# Patient Record
Sex: Female | Born: 1975
Health system: Southern US, Community
[De-identification: ages and names within clinical notes are randomized; demographics above are authoritative.]

## PROBLEM LIST (undated history)

## (undated) DIAGNOSIS — B029 Zoster without complications: Secondary | ICD-10-CM

## (undated) DIAGNOSIS — F419 Anxiety disorder, unspecified: Secondary | ICD-10-CM

## (undated) DIAGNOSIS — G8929 Other chronic pain: Secondary | ICD-10-CM

## (undated) DIAGNOSIS — N393 Stress incontinence (female) (male): Secondary | ICD-10-CM

## (undated) DIAGNOSIS — M069 Rheumatoid arthritis, unspecified: Secondary | ICD-10-CM

## (undated) DIAGNOSIS — R1031 Right lower quadrant pain: Secondary | ICD-10-CM

## (undated) DIAGNOSIS — M5030 Other cervical disc degeneration, unspecified cervical region: Secondary | ICD-10-CM

## (undated) DIAGNOSIS — R0600 Dyspnea, unspecified: Secondary | ICD-10-CM

## (undated) DIAGNOSIS — M1990 Unspecified osteoarthritis, unspecified site: Secondary | ICD-10-CM

## (undated) DIAGNOSIS — Z8639 Personal history of other endocrine, nutritional and metabolic disease: Secondary | ICD-10-CM

## (undated) DIAGNOSIS — E039 Hypothyroidism, unspecified: Secondary | ICD-10-CM

## (undated) DIAGNOSIS — R112 Nausea with vomiting, unspecified: Secondary | ICD-10-CM

## (undated) DIAGNOSIS — I10 Essential (primary) hypertension: Secondary | ICD-10-CM

## (undated) DIAGNOSIS — F32A Depression, unspecified: Secondary | ICD-10-CM

## (undated) DIAGNOSIS — Z87442 Personal history of urinary calculi: Secondary | ICD-10-CM

## (undated) DIAGNOSIS — Z9889 Other specified postprocedural states: Secondary | ICD-10-CM

## (undated) DIAGNOSIS — K219 Gastro-esophageal reflux disease without esophagitis: Secondary | ICD-10-CM

## (undated) HISTORY — DX: Rheumatoid arthritis, unspecified: M06.9

## (undated) HISTORY — PX: WISDOM TOOTH EXTRACTION: SHX21

## (undated) HISTORY — PX: OTHER SURGICAL HISTORY: SHX169

## (undated) HISTORY — PX: DILATION AND CURETTAGE OF UTERUS: SHX78

---

## 1997-02-20 HISTORY — PX: KNEE ARTHROSCOPY: SUR90

## 1999-02-21 DIAGNOSIS — M069 Rheumatoid arthritis, unspecified: Secondary | ICD-10-CM | POA: Insufficient documentation

## 2003-01-20 ENCOUNTER — Other Ambulatory Visit: Admission: RE | Admit: 2003-01-20 | Discharge: 2003-01-20 | Payer: Self-pay | Admitting: Obstetrics and Gynecology

## 2003-10-26 ENCOUNTER — Emergency Department (HOSPITAL_COMMUNITY): Admission: EM | Admit: 2003-10-26 | Discharge: 2003-10-26 | Payer: Self-pay | Admitting: Emergency Medicine

## 2003-10-28 ENCOUNTER — Ambulatory Visit (HOSPITAL_COMMUNITY): Admission: RE | Admit: 2003-10-28 | Discharge: 2003-10-28 | Payer: Self-pay | Admitting: Orthopedic Surgery

## 2004-01-13 ENCOUNTER — Ambulatory Visit (HOSPITAL_COMMUNITY): Admission: RE | Admit: 2004-01-13 | Discharge: 2004-01-13 | Payer: Self-pay | Admitting: Orthopedic Surgery

## 2004-01-25 ENCOUNTER — Other Ambulatory Visit: Admission: RE | Admit: 2004-01-25 | Discharge: 2004-01-25 | Payer: Self-pay | Admitting: Obstetrics and Gynecology

## 2005-03-02 ENCOUNTER — Other Ambulatory Visit: Admission: RE | Admit: 2005-03-02 | Discharge: 2005-03-02 | Payer: Self-pay | Admitting: Obstetrics and Gynecology

## 2005-09-29 ENCOUNTER — Ambulatory Visit (HOSPITAL_COMMUNITY): Admission: RE | Admit: 2005-09-29 | Discharge: 2005-09-29 | Payer: Self-pay | Admitting: Internal Medicine

## 2005-12-06 ENCOUNTER — Ambulatory Visit (HOSPITAL_COMMUNITY): Admission: RE | Admit: 2005-12-06 | Discharge: 2005-12-06 | Payer: Self-pay | Admitting: Internal Medicine

## 2007-01-28 ENCOUNTER — Ambulatory Visit (HOSPITAL_COMMUNITY): Admission: RE | Admit: 2007-01-28 | Discharge: 2007-01-28 | Payer: Self-pay | Admitting: Neurology

## 2007-03-01 ENCOUNTER — Inpatient Hospital Stay (HOSPITAL_COMMUNITY): Admission: AD | Admit: 2007-03-01 | Discharge: 2007-03-05 | Payer: Self-pay | Admitting: Obstetrics and Gynecology

## 2007-04-22 ENCOUNTER — Ambulatory Visit (HOSPITAL_COMMUNITY): Admission: RE | Admit: 2007-04-22 | Discharge: 2007-04-22 | Payer: Self-pay | Admitting: Neurology

## 2007-04-25 ENCOUNTER — Ambulatory Visit (HOSPITAL_COMMUNITY): Admission: RE | Admit: 2007-04-25 | Discharge: 2007-04-25 | Payer: Self-pay | Admitting: Neurology

## 2007-04-25 ENCOUNTER — Ambulatory Visit: Payer: Self-pay | Admitting: Cardiology

## 2010-06-21 ENCOUNTER — Ambulatory Visit (HOSPITAL_COMMUNITY)
Admission: RE | Admit: 2010-06-21 | Discharge: 2010-06-21 | Disposition: A | Discharge status: Home / Self Care | Payer: BC Managed Care – PPO | Source: Ambulatory Visit | Attending: Internal Medicine | Admitting: Internal Medicine

## 2010-06-21 ENCOUNTER — Other Ambulatory Visit (HOSPITAL_COMMUNITY): Payer: Self-pay | Admitting: Internal Medicine

## 2010-06-21 DIAGNOSIS — R05 Cough: Secondary | ICD-10-CM

## 2010-06-21 DIAGNOSIS — R062 Wheezing: Secondary | ICD-10-CM | POA: Insufficient documentation

## 2010-06-21 DIAGNOSIS — R059 Cough, unspecified: Secondary | ICD-10-CM | POA: Insufficient documentation

## 2010-07-05 NOTE — Op Note (Signed)
Jessica Pacheco, Jessica Pacheco               ACCOUNT NO.:  [id number]   MEDICAL RECORD NO.:  [id number]          PATIENT TYPE:  INP   LOCATION:  9160                          FACILITY:  [facility]   PHYSICIAN:  Duke Salvia. Marcelle Overlie, M.D.DATE OF BIRTH:  1975-07-29   DATE OF PROCEDURE:  03/02/2007  DATE OF DISCHARGE:                               OPERATIVE REPORT   After a second stage of 2-1/2 to 3 hours was noticed to be +3 station,  straight OA, vertex visible with pushing effort, Foley catheter was in  place.  Discussion regarding VE assist which she preferred due to  maternal exhaustion.  The Kiwi was applied, coordinated with maternal  pushing efforts, x2 traction efforts to effect delivery of a female.  The infant was suctioned and the remainder of the torso was delivered  easily.  Apgars 9 and 9.  Cord pH was sent.  The infant was suctioned,  cord clamped and placed in the warmer.  Placenta delivered spontaneously  intact.  Atony was noted initially with some brisk bleeding, responded  to massage, IV Pitocin due to continued bleeding.  She did receive IM  Methergine x1 and Hemabate 1 amp followed in 20-30 minutes by another  amp with again estimated EBL of 1000 mL, continued massage that was  going on during this time of the repair.  Gloved hand was used to  explore the rectum.  The rectum was intact.  There was a partial third  degree extension.  The sphincter was repaired with interrupted 2-0  Vicryl figure-of-eight sutures and a standard layer repair with zero  Vicryl Rapide.  A right periurethral laceration was then closed with  interrupted 3-0 Vicryl Rapide sutures with some nursing assistance with  a retractor for better visualization.  She did have a type and screen  performed during that time.  Foley catheter was repositioned to follow  her output and hemoglobin will be checked within the hour.  BP was last  127/77 with pulse of 123.      Richard M. Marcelle Overlie, M.D.  Electronically  Signed     [facility]MEDQ  D:  03/02/2007  T:  03/02/2007  Job:  [id number]

## 2010-11-10 LAB — URIC ACID: Uric Acid, Serum: 6.7

## 2010-11-10 LAB — CBC
HCT: 14.4 — ABNORMAL LOW
HCT: 16.3 — ABNORMAL LOW
HCT: 21 — ABNORMAL LOW
HCT: 33.1 — ABNORMAL LOW
Hemoglobin: 11.4 — ABNORMAL LOW
Hemoglobin: 5 — CL
Hemoglobin: 5.5 — CL
Hemoglobin: 7.3 — CL
MCHC: 33.6
MCHC: 34.4
MCHC: 34.5
MCHC: 34.5
MCV: 92.9
MCV: 93.7
MCV: 94.4
MCV: 94.6
Platelets: 141 — ABNORMAL LOW
Platelets: 142 — ABNORMAL LOW
Platelets: 149 — ABNORMAL LOW
Platelets: 164
RBC: 1.52 — ABNORMAL LOW
RBC: 1.73 — ABNORMAL LOW
RBC: 2.24 — ABNORMAL LOW
RBC: 3.56 — ABNORMAL LOW
RDW: 13.9
RDW: 14.8
RDW: 14.8
RDW: 15
WBC: 10.2
WBC: 13 — ABNORMAL HIGH
WBC: 18 — ABNORMAL HIGH
WBC: 7.7

## 2010-11-10 LAB — URINALYSIS, DIPSTICK ONLY
Bilirubin Urine: NEGATIVE
Glucose, UA: NEGATIVE
Ketones, ur: NEGATIVE
Leukocytes, UA: NEGATIVE
Nitrite: NEGATIVE
Protein, ur: NEGATIVE
Specific Gravity, Urine: 1.01
Urobilinogen, UA: 0.2
pH: 7

## 2010-11-10 LAB — HEMOGLOBIN AND HEMATOCRIT, BLOOD
HCT: 24.6 — ABNORMAL LOW
HCT: 27 — ABNORMAL LOW
Hemoglobin: 8.6 — ABNORMAL LOW
Hemoglobin: 9.2 — ABNORMAL LOW

## 2010-11-10 LAB — COMPREHENSIVE METABOLIC PANEL
ALT: 14
AST: 30
Albumin: 2.7 — ABNORMAL LOW
Alkaline Phosphatase: 219 — ABNORMAL HIGH
BUN: 7
CO2: 24
Calcium: 9.6
Chloride: 106
Creatinine, Ser: 1.07
GFR calc Af Amer: >60
GFR calc non Af Amer: 60 — ABNORMAL LOW
Glucose, Bld: 93
Potassium: 3.9
Sodium: 139
Total Bilirubin: 1.1
Total Protein: 6

## 2010-11-10 LAB — RPR: RPR Ser Ql: NONREACTIVE

## 2010-11-10 LAB — TYPE AND SCREEN
ABO/RH(D): A POS
Antibody Screen: NEGATIVE

## 2010-11-10 LAB — ABO/RH: ABO/RH(D): A POS

## 2010-11-10 LAB — PROTEIN, URINE, RANDOM: Total Protein, Urine: 25

## 2010-11-10 LAB — LACTATE DEHYDROGENASE: LDH: 130

## 2011-01-02 ENCOUNTER — Ambulatory Visit (INDEPENDENT_AMBULATORY_CARE_PROVIDER_SITE_OTHER): Payer: Self-pay | Admitting: Pharmacist

## 2011-01-02 DIAGNOSIS — Z309 Encounter for contraceptive management, unspecified: Secondary | ICD-10-CM

## 2011-01-02 DIAGNOSIS — IMO0001 Reserved for inherently not codable concepts without codable children: Secondary | ICD-10-CM | POA: Insufficient documentation

## 2011-01-02 DIAGNOSIS — M069 Rheumatoid arthritis, unspecified: Secondary | ICD-10-CM

## 2011-01-02 NOTE — Progress Notes (Signed)
  Subjective:    Patient ID: Jessica Pacheco, female    DOB: Jan 24, 1976, 35 y.o.   MRN: [id number]  HPI  Reviewed and agree with Dr. Macky Lower management.   Review of Systems     Objective:   Physical Exam        Assessment & Plan:

## 2011-01-02 NOTE — Assessment & Plan Note (Signed)
Following medication review, no suggestions for change.  Complete medication list provided to patient.  Total time in face to face medication review: 10 minutes.

## 2011-01-02 NOTE — Progress Notes (Signed)
  Subjective:    Patient ID: Jessica Pacheco, female    DOB: 07-18-1975, 35 y.o.   MRN: [id number]  HPI Patient arrives in good spirits for medication review accompanied by her daughter. She reports seeing Carylon Perches as her primary care provider, Mariah Milling as her rheumatologist, and Richarda Overlie as her Ob-Gyn. She has been diagnosed with Rheumatoid Arthritis for 11 years and states she is currently under acceptable level of control.      Review of Systems     Objective:   Physical Exam        Assessment & Plan:  Following medication review, no suggestions for change.  Complete medication list provided to patient.  Total time in face to face medication review: 10 minutes.

## 2011-07-26 ENCOUNTER — Encounter (INDEPENDENT_AMBULATORY_CARE_PROVIDER_SITE_OTHER): Payer: Self-pay | Admitting: *Deleted

## 2011-07-27 ENCOUNTER — Ambulatory Visit (INDEPENDENT_AMBULATORY_CARE_PROVIDER_SITE_OTHER): Payer: 59 | Admitting: Internal Medicine

## 2011-07-27 ENCOUNTER — Encounter (INDEPENDENT_AMBULATORY_CARE_PROVIDER_SITE_OTHER): Payer: Self-pay | Admitting: Internal Medicine

## 2011-07-27 VITALS — BP 132/90 | HR 80 | Temp 98.2°F | Ht 66.0 in | Wt 193.7 lb

## 2011-07-27 DIAGNOSIS — R195 Other fecal abnormalities: Secondary | ICD-10-CM

## 2011-07-27 DIAGNOSIS — R194 Change in bowel habit: Secondary | ICD-10-CM

## 2011-07-27 DIAGNOSIS — R198 Other specified symptoms and signs involving the digestive system and abdomen: Secondary | ICD-10-CM

## 2011-07-27 LAB — CBC WITH DIFFERENTIAL/PLATELET
Basophils Relative: 0 % (ref 0–1)
Hemoglobin: 12 g/dL (ref 12.0–15.0)
Lymphocytes Relative: 41 % (ref 12–46)
MCHC: 31.5 g/dL (ref 30.0–36.0)
Monocytes Relative: 11 % (ref 3–12)
Neutro Abs: 2.7 10*3/uL (ref 1.7–7.7)
Neutrophils Relative %: 45 % (ref 43–77)
RBC: 4.1 MIL/uL (ref 3.87–5.11)
WBC: 5.9 10*3/uL (ref 4.0–10.5)

## 2011-07-27 MED ORDER — HYOSCYAMINE SULFATE 0.125 MG SL SUBL: 0.1250 mg | SUBLINGUAL_TABLET | SUBLINGUAL | Status: DC | PRN

## 2011-07-27 NOTE — Patient Instructions (Signed)
Stool studies and a CBC today. If normal, Colonoscopy. I discussed this case with Dr Karilyn Cota.

## 2011-07-27 NOTE — Progress Notes (Signed)
Subjective:     Patient ID: Jessica Pacheco, female   DOB: 03/09/1975, 36 y.o.   MRN: [id number]  HPI Referred by Dr. Ouida Sills  For change in her stools.  She has abdominal pain and sometimes the pain will localize on the rt.  Stools are watery, soft, to loose.  Some are formed.  Sometimes they are pure water.  This has been occuring for 2 months.  Here stomach will become upset and she will have diarrhea.  When she has the pain, she has to bend over.   Crampy type pain. Stools have had a foul smell at times. No melena or bright red rectal bleeding. She has tried Imodium in the past. Appetite is good. No unintentional weight loss.  No abdominal now. She is having 4-10 stools a day. She normal has 3 stools a day. There is a family hx of colon cancer. Aunt had colon cancer age 36. Grandmother had ulcer and diverticulitis. Recently diagnosed with pancreatic and adrenal gland cancer.      Augmentin 875mg  BID x 10 days for a sinus infection 2 months ago  Review of Systems see hpi Current Outpatient Prescriptions  Medication Sig Dispense Refill  . etanercept (ENBREL) 50 MG/ML injection Inject 0.98 mLs (50 mg total) into the skin once a week.  0.98 mL    . leflunomide (ARAVA) 20 MG tablet Take 1 tablet (20 mg total) by mouth daily.      . Norgestimate-Ethinyl Estradiol Triphasic (TRINESSA, 28,) 0.18/0.215/0.25 MG-35 MCG tablet Take 1 tablet by mouth daily.       Past Medical History  Diagnosis Date  . Rheumatoid arthritis   . Hypoglycemia   . Ovarian cyst   . Kidney stones   . Hemorrhagic cystitis    Past Surgical History  Procedure Date  . Knee surgery     rt, arthroscopy  . Wisdom tooth extraction    History   Social History  . Marital Status: Married    Spouse Name: N/A    Number of Children: N/A  . Years of Education: N/A   Occupational History  . Not on file.   Social History Main Topics  . Smoking status: Never Smoker   . Smokeless tobacco: Not on file  . Alcohol Use:  No  . Drug Use: No  . Sexually Active: Not on file   Other Topics Concern  . Not on file   Social History Narrative  . No narrative on file   Family Status  Relation Status Death Age  . Mother Alive     Thyroid problems, hypoglycemic  . Father Alive     DM2, HTN, Hx of polyps  . Sister Alive     good health   No Known Allergies      Objective:   Physical Exam Filed Vitals:   07/27/11 1511  Height: 5\' 6"  (1.676 m)  Weight: 193 lb 11.2 oz (87.862 kg)         Alert and oriented. Skin warm and dry. Oral mucosa is moist.   . Sclera anicteric, conjunctivae is pink. Thyroid not enlarged. No cervical lymphadenopathy. Lungs clear. Heart regular rate and rhythm.  Abdomen is soft. Bowel sounds are positive. No hepatomegaly. No abdominal masses felt. No tenderness.negative  No edema to lower extremities. Patient is alert and oriented. Stool brown and guaiac negative.      Assessment: Change in stools. Diarrhea. Hx of recent antibiotic use. C difficile needs to be ruled. I discussed this case  with Dr. Karilyn Cota. Plan:   Stool WBC, C and S. C.difficile. CBC . If stool studies are negative, Dr. Karilyn Cota will proceed with a colonoscopy.

## 2011-07-28 ENCOUNTER — Telehealth (INDEPENDENT_AMBULATORY_CARE_PROVIDER_SITE_OTHER): Payer: Self-pay | Admitting: *Deleted

## 2011-07-28 DIAGNOSIS — R197 Diarrhea, unspecified: Secondary | ICD-10-CM

## 2011-07-28 NOTE — Telephone Encounter (Signed)
Corrected test

## 2011-07-28 NOTE — Telephone Encounter (Signed)
Correct test

## 2011-07-29 LAB — FECAL LACTOFERRIN, QUANT: Lactoferrin: POSITIVE

## 2011-08-01 ENCOUNTER — Telehealth (INDEPENDENT_AMBULATORY_CARE_PROVIDER_SITE_OTHER): Payer: Self-pay | Admitting: Internal Medicine

## 2011-08-01 DIAGNOSIS — A498 Other bacterial infections of unspecified site: Secondary | ICD-10-CM

## 2011-08-01 LAB — STOOL CULTURE

## 2011-08-01 MED ORDER — METRONIDAZOLE 500 MG PO TABS: 500.0000 mg | ORAL_TABLET | Freq: Three times a day (TID) | ORAL | Status: AC

## 2011-08-01 NOTE — Telephone Encounter (Signed)
Positive C-diff. Will call an Rx for Flagyl 500mg  tid x 14 days

## 2011-08-02 NOTE — Telephone Encounter (Signed)
This has been addressed.

## 2011-08-07 ENCOUNTER — Telehealth (INDEPENDENT_AMBULATORY_CARE_PROVIDER_SITE_OTHER): Payer: Self-pay | Admitting: Internal Medicine

## 2011-08-07 DIAGNOSIS — R110 Nausea: Secondary | ICD-10-CM

## 2011-08-07 MED ORDER — ONDANSETRON HCL 4 MG PO TABS: 4.0000 mg | ORAL_TABLET | Freq: Three times a day (TID) | ORAL | Status: AC | PRN

## 2011-08-07 NOTE — Telephone Encounter (Signed)
C/o nausea. Will call an Rx in for Zofran

## 2011-08-14 ENCOUNTER — Telehealth (INDEPENDENT_AMBULATORY_CARE_PROVIDER_SITE_OTHER): Payer: Self-pay | Admitting: Internal Medicine

## 2011-08-14 DIAGNOSIS — R110 Nausea: Secondary | ICD-10-CM

## 2011-08-14 MED ORDER — PROMETHAZINE HCL 25 MG PO TABS: 25.0000 mg | ORAL_TABLET | Freq: Four times a day (QID) | ORAL | Status: DC | PRN

## 2011-08-14 NOTE — Telephone Encounter (Signed)
Saw at hospital. C/o nausea. Zofran really not helping. Will eprescribe Phenergan.

## 2011-08-28 ENCOUNTER — Encounter (INDEPENDENT_AMBULATORY_CARE_PROVIDER_SITE_OTHER): Payer: Self-pay | Admitting: Internal Medicine

## 2011-08-28 ENCOUNTER — Ambulatory Visit (INDEPENDENT_AMBULATORY_CARE_PROVIDER_SITE_OTHER): Payer: 59 | Admitting: Internal Medicine

## 2011-08-28 VITALS — BP 124/68 | HR 72 | Temp 98.2°F | Ht 66.5 in | Wt 187.1 lb

## 2011-08-28 DIAGNOSIS — A0472 Enterocolitis due to Clostridium difficile, not specified as recurrent: Secondary | ICD-10-CM

## 2011-08-28 DIAGNOSIS — K2970 Gastritis, unspecified, without bleeding: Secondary | ICD-10-CM

## 2011-08-28 NOTE — Patient Instructions (Addendum)
Dexilant # 10 given. OV prn

## 2011-08-28 NOTE — Progress Notes (Signed)
Subjective:     Patient ID: Jessica Pacheco, female   DOB: April 19, 1975, 36 y.o.   MRN: [id number]  HPI Here for f/u.  She is 95% better. She does have some epigastric tenderness after she eats. It does not matter what she eats. Sometimes it goes thru to her back.  Seen approximately 3 weeks ago for change in her stools.  Noted to have a positive C. Difficile. She was started on Flagyl and Cipro. Her stools are normal at this time. She does tell me that when she eats she will occasionally have urgency to go and this is normal.  She had a metallic taste in her mouth while taking the Flagyl and Zofran and Phenergan was called in for her nausea. Appetite is good but not quite normal.. She has lost about 10 pounds. She is having 1-4 stools a day depending on what she eats. Stools are formed.   Review of Systems Current Outpatient Prescriptions  Medication Sig Dispense Refill  . etanercept (ENBREL) 50 MG/ML injection Inject 0.98 mLs (50 mg total) into the skin once a week.  0.98 mL    . hyoscyamine (LEVSIN/SL) 0.125 MG SL tablet Place 1 tablet (0.125 mg total) under the tongue every 4 (four) hours as needed for cramping.  60 tablet  2  . leflunomide (ARAVA) 20 MG tablet Take 1 tablet (20 mg total) by mouth daily.      . Norgestimate-Ethinyl Estradiol Triphasic (TRINESSA, 28,) 0.18/0.215/0.25 MG-35 MCG tablet Take 1 tablet by mouth daily.      . promethazine (PHENERGAN) 25 MG tablet Take 1 tablet (25 mg total) by mouth every 6 (six) hours as needed for nausea.  30 tablet  0   Past Surgical History  Procedure Date  . Knee surgery     rt, arthroscopy  . Wisdom tooth extraction    Past Medical History  Diagnosis Date  . Rheumatoid arthritis   . Hypoglycemia   . Ovarian cyst   . Kidney stones   . Hemorrhagic cystitis    Family Status  Relation Status Death Age  . Mother Alive     Thyroid problems, hypoglycemic  . Father Alive     DM2, HTN, Hx of polyps  . Sister Alive     good health    History   Social History  . Marital Status: Married    Spouse Name: N/A    Number of Children: N/A  . Years of Education: N/A   Occupational History  . Not on file.   Social History Main Topics  . Smoking status: Never Smoker   . Smokeless tobacco: Not on file  . Alcohol Use: No  . Drug Use: No  . Sexually Active: Not on file   Other Topics Concern  . Not on file   Social History Narrative  . No narrative on file   No Known Allergies     Objective:   Physical Exam Filed Vitals:   08/28/11 1612  Height: 5' 6.5" (1.689 m)  Weight: 187 lb 1.6 oz (84.868 kg)  Alert and oriented. Skin warm and dry. Oral mucosa is moist.   . Sclera anicteric, conjunctivae is pink. Thyroid not enlarged. No cervical lymphadenopathy. Lungs clear. Heart regular rate and rhythm.  Abdomen is soft. Bowel sounds are positive. No hepatomegaly. No abdominal masses felt. No tenderness.  No edema to lower extremities. Patient is alert and oriented.       Assessment:   c-diff.which is better now. Stools are  back to baseline. Epigastric tenderness especially after eating. Possible gastritis from the Flagyl.    Plan:   Will give samples of Dexilant #10 to get her over the hump from taking the Flagyl and Cipro. OV prn. PR in 2 weeks.

## 2011-12-14 ENCOUNTER — Ambulatory Visit (HOSPITAL_COMMUNITY)
Admission: RE | Admit: 2011-12-14 | Discharge: 2011-12-14 | Disposition: A | Discharge status: Home / Self Care | Payer: 59 | Source: Ambulatory Visit | Attending: Obstetrics and Gynecology | Admitting: Obstetrics and Gynecology

## 2011-12-14 ENCOUNTER — Other Ambulatory Visit (HOSPITAL_COMMUNITY): Payer: Self-pay | Admitting: Obstetrics and Gynecology

## 2011-12-14 DIAGNOSIS — Z1231 Encounter for screening mammogram for malignant neoplasm of breast: Secondary | ICD-10-CM | POA: Insufficient documentation

## 2011-12-14 DIAGNOSIS — Z139 Encounter for screening, unspecified: Secondary | ICD-10-CM

## 2011-12-29 ENCOUNTER — Ambulatory Visit (INDEPENDENT_AMBULATORY_CARE_PROVIDER_SITE_OTHER): Payer: 59 | Admitting: Pharmacist

## 2011-12-29 ENCOUNTER — Encounter: Payer: Self-pay | Admitting: Pharmacist

## 2011-12-29 VITALS — BP 126/86 | HR 75 | Ht 65.0 in | Wt 198.1 lb

## 2011-12-29 DIAGNOSIS — M069 Rheumatoid arthritis, unspecified: Secondary | ICD-10-CM

## 2011-12-29 NOTE — Progress Notes (Signed)
  Subjective:    Patient ID: Jessica Pacheco, female    DOB: [birth year], 36 y.o.   MRN: [id number]  HPI Patient arrives in good spirits for medication review accompanied by her daughter. She reports seeing Carylon Perches as her primary care provider, Mariah Milling as her rheumatologist, and Richarda Overlie as her Ob-Gyn and  Raynelle Bring as NP for GI She has been diagnosed with Rheumatoid Arthritis for 12 years and states she is currently under acceptable level of control.    Review of Systems     Objective:   Physical Exam        Assessment & Plan:  Following medication review, no suggestions for change.  Complete medication list provided to patient.  Total time in face to face medication review: 5 minutes.  Patient seen with: Maryanna Shape PharmD, Pharmacy Resident and Dr. Marikay Alar, medical resident.

## 2011-12-29 NOTE — Patient Instructions (Signed)
Thank you for coming in

## 2011-12-29 NOTE — Assessment & Plan Note (Signed)
Following medication review, no suggestions for change.  Complete medication list provided to patient.  Total time in face to face medication review: 5 minutes.  Patient seen with: Maryanna Shape PharmD, Pharmacy Resident and Dr. Marikay Alar, medical resident

## 2012-01-01 NOTE — Progress Notes (Signed)
Patient ID: Jessica Pacheco, female   DOB: 01-06-1976, 36 y.o.   MRN: [id number] Reviewed and agree with Dr. Macky Lower management and documentation.

## 2013-09-15 ENCOUNTER — Other Ambulatory Visit (HOSPITAL_COMMUNITY): Payer: Self-pay | Admitting: Internal Medicine

## 2013-09-15 ENCOUNTER — Ambulatory Visit (HOSPITAL_COMMUNITY)
Admission: RE | Admit: 2013-09-15 | Discharge: 2013-09-15 | Disposition: A | Discharge status: Home / Self Care | Payer: 59 | Source: Ambulatory Visit | Attending: Internal Medicine | Admitting: Internal Medicine

## 2013-09-15 DIAGNOSIS — N200 Calculus of kidney: Secondary | ICD-10-CM

## 2013-09-15 DIAGNOSIS — N201 Calculus of ureter: Secondary | ICD-10-CM | POA: Insufficient documentation

## 2013-09-15 DIAGNOSIS — K7689 Other specified diseases of liver: Secondary | ICD-10-CM | POA: Insufficient documentation

## 2013-12-26 ENCOUNTER — Ambulatory Visit (HOSPITAL_COMMUNITY)
Admission: RE | Admit: 2013-12-26 | Discharge: 2013-12-26 | Disposition: A | Discharge status: Home / Self Care | Payer: 59 | Source: Ambulatory Visit | Attending: Internal Medicine | Admitting: Internal Medicine

## 2013-12-26 ENCOUNTER — Other Ambulatory Visit (HOSPITAL_COMMUNITY): Payer: Self-pay | Admitting: Internal Medicine

## 2013-12-26 DIAGNOSIS — M25572 Pain in left ankle and joints of left foot: Secondary | ICD-10-CM

## 2013-12-26 DIAGNOSIS — M79672 Pain in left foot: Secondary | ICD-10-CM | POA: Insufficient documentation

## 2013-12-26 DIAGNOSIS — M7752 Other enthesopathy of left foot: Secondary | ICD-10-CM | POA: Diagnosis not present

## 2013-12-26 DIAGNOSIS — M0589 Other rheumatoid arthritis with rheumatoid factor of multiple sites: Secondary | ICD-10-CM

## 2014-01-16 ENCOUNTER — Ambulatory Visit (HOSPITAL_COMMUNITY)
Admission: RE | Admit: 2014-01-16 | Discharge: 2014-01-16 | Disposition: A | Discharge status: Home / Self Care | Payer: 59 | Source: Ambulatory Visit | Attending: Internal Medicine | Admitting: Internal Medicine

## 2014-01-16 ENCOUNTER — Other Ambulatory Visit (HOSPITAL_COMMUNITY): Payer: Self-pay | Admitting: Internal Medicine

## 2014-01-16 DIAGNOSIS — M25572 Pain in left ankle and joints of left foot: Secondary | ICD-10-CM | POA: Diagnosis not present

## 2014-01-16 DIAGNOSIS — M0609 Rheumatoid arthritis without rheumatoid factor, multiple sites: Secondary | ICD-10-CM

## 2014-01-16 DIAGNOSIS — M069 Rheumatoid arthritis, unspecified: Secondary | ICD-10-CM | POA: Insufficient documentation

## 2014-06-30 DIAGNOSIS — M19072 Primary osteoarthritis, left ankle and foot: Secondary | ICD-10-CM | POA: Insufficient documentation

## 2015-03-09 DIAGNOSIS — B070 Plantar wart: Secondary | ICD-10-CM | POA: Diagnosis not present

## 2015-03-16 DIAGNOSIS — B070 Plantar wart: Secondary | ICD-10-CM | POA: Diagnosis not present

## 2015-03-23 DIAGNOSIS — B070 Plantar wart: Secondary | ICD-10-CM | POA: Diagnosis not present

## 2015-04-14 MED FILL — TRI-PREVIFEM TABLET: 0.18/0.215/ | 84 days supply | Qty: 84 | Fill #2

## 2015-04-14 MED FILL — LEFLUNOMIDE 20 MG TABLET: 20 | 30 days supply | Qty: 30 | Fill #2

## 2015-04-14 MED FILL — ORENCIA 125 MG/ML SYRINGE: 125 | 28 days supply | Qty: 4 | Fill #1

## 2015-04-20 DIAGNOSIS — M79671 Pain in right foot: Secondary | ICD-10-CM | POA: Diagnosis not present

## 2015-05-04 DIAGNOSIS — M79671 Pain in right foot: Secondary | ICD-10-CM | POA: Diagnosis not present

## 2015-05-12 MED FILL — ORENCIA 125 MG/ML SYRINGE: 125 | 28 days supply | Qty: 4 | Fill #2

## 2015-05-12 MED FILL — LEFLUNOMIDE 20 MG TABLET: 20 | 30 days supply | Qty: 30 | Fill #3

## 2015-05-13 DIAGNOSIS — G8929 Other chronic pain: Secondary | ICD-10-CM | POA: Diagnosis not present

## 2015-05-13 DIAGNOSIS — M25551 Pain in right hip: Secondary | ICD-10-CM | POA: Diagnosis not present

## 2015-05-13 DIAGNOSIS — M19072 Primary osteoarthritis, left ankle and foot: Secondary | ICD-10-CM | POA: Diagnosis not present

## 2015-05-13 DIAGNOSIS — M25562 Pain in left knee: Secondary | ICD-10-CM | POA: Diagnosis not present

## 2015-05-13 DIAGNOSIS — Z79899 Other long term (current) drug therapy: Secondary | ICD-10-CM | POA: Diagnosis not present

## 2015-05-13 DIAGNOSIS — M25561 Pain in right knee: Secondary | ICD-10-CM | POA: Diagnosis not present

## 2015-05-13 DIAGNOSIS — M25552 Pain in left hip: Secondary | ICD-10-CM | POA: Diagnosis not present

## 2015-05-13 DIAGNOSIS — M0609 Rheumatoid arthritis without rheumatoid factor, multiple sites: Secondary | ICD-10-CM | POA: Diagnosis not present

## 2015-05-18 ENCOUNTER — Other Ambulatory Visit (HOSPITAL_COMMUNITY): Payer: Self-pay | Admitting: Internal Medicine

## 2015-05-18 ENCOUNTER — Ambulatory Visit (HOSPITAL_COMMUNITY)
Admission: RE | Admit: 2015-05-18 | Discharge: 2015-05-18 | Disposition: A | Discharge status: Home / Self Care | Payer: 59 | Source: Ambulatory Visit | Attending: Internal Medicine | Admitting: Internal Medicine

## 2015-05-18 DIAGNOSIS — M25552 Pain in left hip: Secondary | ICD-10-CM | POA: Diagnosis not present

## 2015-05-18 DIAGNOSIS — M79642 Pain in left hand: Secondary | ICD-10-CM | POA: Diagnosis not present

## 2015-05-18 DIAGNOSIS — M0609 Rheumatoid arthritis without rheumatoid factor, multiple sites: Secondary | ICD-10-CM

## 2015-05-18 DIAGNOSIS — M170 Bilateral primary osteoarthritis of knee: Secondary | ICD-10-CM | POA: Insufficient documentation

## 2015-05-18 DIAGNOSIS — M958 Other specified acquired deformities of musculoskeletal system: Secondary | ICD-10-CM | POA: Diagnosis not present

## 2015-05-18 DIAGNOSIS — M25551 Pain in right hip: Secondary | ICD-10-CM | POA: Insufficient documentation

## 2015-05-18 DIAGNOSIS — M25561 Pain in right knee: Secondary | ICD-10-CM | POA: Diagnosis not present

## 2015-05-18 DIAGNOSIS — M179 Osteoarthritis of knee, unspecified: Secondary | ICD-10-CM | POA: Diagnosis not present

## 2015-05-18 DIAGNOSIS — M1611 Unilateral primary osteoarthritis, right hip: Secondary | ICD-10-CM | POA: Diagnosis not present

## 2015-05-18 DIAGNOSIS — M25562 Pain in left knee: Secondary | ICD-10-CM

## 2015-06-14 MED FILL — LEFLUNOMIDE 20 MG TABLET: 20 | 30 days supply | Qty: 30 | Fill #0

## 2015-06-30 MED FILL — ACYCLOVIR 400 MG TABLET: 400 | 7 days supply | Qty: 14 | Fill #0

## 2015-07-02 MED FILL — ORENCIA 125 MG/ML SYRINGE: 125 | 28 days supply | Qty: 4 | Fill #3

## 2015-07-14 MED FILL — LEFLUNOMIDE 20 MG TABLET: 20 | 30 days supply | Qty: 30 | Fill #1

## 2015-07-14 MED FILL — TRI-PREVIFEM TABLET: 0.18/0.215/ | 84 days supply | Qty: 84 | Fill #3

## 2015-07-26 ENCOUNTER — Ambulatory Visit: Payer: 59 | Attending: Internal Medicine | Admitting: Pharmacist

## 2015-07-26 DIAGNOSIS — M069 Rheumatoid arthritis, unspecified: Secondary | ICD-10-CM

## 2015-07-26 MED ORDER — ABATACEPT 125 MG/ML ~~LOC~~ SOSY: 125.0000 mg | PREFILLED_SYRINGE | SUBCUTANEOUS | Status: DC

## 2015-07-26 MED FILL — ORENCIA 125 MG/ML SYRINGE: 125 | 28 days supply | Qty: 4 | Fill #0

## 2015-07-26 NOTE — Progress Notes (Signed)
S: Patient presents today to the [facility].  Patient is currently taking Orencia for rheumatoid arthritis. Patient is managed by Mariah Milling for this.   Adherence: denies any missed doses except for when she was waiting on samples - this occurred a while ago.   Dosing: SubQ: 125 mg subQ once weekly. If initiating with an IV loading dose, administer the initial IV infusion per weight based dosing, then administer 125 mg subQ within 24 hours of infusion, followed by 125 mg subQ once weekly thereafter.   Drug-drug interactions: none  Screenings: TB screening: has it through work (works in [employer]) Hepatitis Screening: had Hepatitis B vaccine Blood glucose: Orencia contains maltose which make falsely elevate glucose levels  Monitoring: S/sx of infection: denies S/sx of hypersensitivity: denies Other adverse effects: denies   O:     Lab Results  Component Value Date   WBC 5.9 07/27/2011   HGB 12.0 07/27/2011   HCT 38.1 07/27/2011   MCV 92.9 07/27/2011   PLT 282 07/27/2011      Chemistry      Component Value Date/Time   NA 139 03/01/2007 1205   K 3.9 03/01/2007 1205   CL 106 03/01/2007 1205   CO2 24 03/01/2007 1205   BUN 7 03/01/2007 1205   CREATININE 1.07 03/01/2007 1205      Component Value Date/Time   CALCIUM 9.6 03/01/2007 1205   ALKPHOS 219* 03/01/2007 1205   AST 30 03/01/2007 1205   ALT 14 03/01/2007 1205   BILITOT 1.1 03/01/2007 1205       A/P: 1. Medication review: Patient is tolerating Orencia well with no adverse effects. Reviewed the medication with her, including the following. Dub Amis is a selective T-cell costimulation blocker indicated for rheumatoid arthritis. The most common adverse effects are infections, headache, and injection site reactions. There is a possible adverse effect of increased risk of malignancy but it is not fully understood if this is due to the drug or the disease state itself.  The patient was instructed to avoid use of live vaccinations without the approval of a physician. No recommendations for changes. Patient to follow up with rheumatology as directed.     Juanita Craver, PharmD, BCPS, CPP Clinical Pharmacist Practitioner  [facility] [phone]  Evaluation and management procedures were performed by the Clinical Pharmacy Practitioner under my supervision and collaboration. I have reviewed the CPP's note and chart, and I agree with the management and plan.  Jeanann Lewandowsky, MD, MHA, CPE, FACP, FAAP [facility] and [facility] [place], [place] [phone]   07/26/2015, 5:47 PM

## 2015-08-12 MED FILL — LEFLUNOMIDE 20 MG TABLET: 20 | 30 days supply | Qty: 30 | Fill #2

## 2015-08-30 DIAGNOSIS — M1611 Unilateral primary osteoarthritis, right hip: Secondary | ICD-10-CM | POA: Insufficient documentation

## 2015-08-30 DIAGNOSIS — M170 Bilateral primary osteoarthritis of knee: Secondary | ICD-10-CM | POA: Insufficient documentation

## 2015-08-30 DIAGNOSIS — Z79899 Other long term (current) drug therapy: Secondary | ICD-10-CM | POA: Diagnosis not present

## 2015-08-30 DIAGNOSIS — M0579 Rheumatoid arthritis with rheumatoid factor of multiple sites without organ or systems involvement: Secondary | ICD-10-CM | POA: Diagnosis not present

## 2015-09-07 MED FILL — ORENCIA 125 MG/ML SYRINGE: 125 | 28 days supply | Qty: 4 | Fill #1

## 2015-09-15 ENCOUNTER — Other Ambulatory Visit: Payer: Self-pay | Admitting: Pharmacist

## 2015-09-15 MED ORDER — ABATACEPT 125 MG/ML ~~LOC~~ SOSY: 125.0000 mg | PREFILLED_SYRINGE | SUBCUTANEOUS | 3 refills | Status: DC

## 2015-09-16 MED FILL — LEFLUNOMIDE 20 MG TABLET: 20 | 30 days supply | Qty: 30 | Fill #0

## 2015-10-11 MED FILL — ORENCIA 125 MG/ML SYRINGE: 125 | 28 days supply | Qty: 4 | Fill #0

## 2015-10-11 MED FILL — TRI-PREVIFEM TABLET: 0.18/0.215/ | 84 days supply | Qty: 84 | Fill #0

## 2015-10-21 MED FILL — LEFLUNOMIDE 20 MG TABLET: 20 | 30 days supply | Qty: 30 | Fill #1

## 2015-11-15 MED FILL — ORENCIA 125 MG/ML SYRINGE: 125 | 28 days supply | Qty: 4 | Fill #1

## 2015-11-17 ENCOUNTER — Other Ambulatory Visit (HOSPITAL_COMMUNITY): Payer: Self-pay | Admitting: Obstetrics and Gynecology

## 2015-11-17 DIAGNOSIS — Z6833 Body mass index (BMI) 33.0-33.9, adult: Secondary | ICD-10-CM | POA: Diagnosis not present

## 2015-11-17 DIAGNOSIS — R1011 Right upper quadrant pain: Secondary | ICD-10-CM

## 2015-11-17 DIAGNOSIS — Z01419 Encounter for gynecological examination (general) (routine) without abnormal findings: Secondary | ICD-10-CM | POA: Diagnosis not present

## 2015-11-18 ENCOUNTER — Ambulatory Visit (HOSPITAL_COMMUNITY): Admission: RE | Admit: 2015-11-18 | Payer: 59 | Source: Ambulatory Visit

## 2015-11-18 ENCOUNTER — Ambulatory Visit (HOSPITAL_COMMUNITY): Payer: 59

## 2015-11-18 ENCOUNTER — Other Ambulatory Visit (HOSPITAL_COMMUNITY): Payer: Self-pay | Admitting: Obstetrics and Gynecology

## 2015-11-18 ENCOUNTER — Ambulatory Visit (HOSPITAL_COMMUNITY)
Admission: RE | Admit: 2015-11-18 | Discharge: 2015-11-18 | Disposition: A | Discharge status: Home / Self Care | Payer: 59 | Source: Ambulatory Visit | Attending: Obstetrics and Gynecology | Admitting: Obstetrics and Gynecology

## 2015-11-18 DIAGNOSIS — R102 Pelvic and perineal pain: Secondary | ICD-10-CM | POA: Diagnosis not present

## 2015-11-18 DIAGNOSIS — R1031 Right lower quadrant pain: Secondary | ICD-10-CM

## 2015-11-19 ENCOUNTER — Ambulatory Visit (HOSPITAL_COMMUNITY): Admission: RE | Admit: 2015-11-19 | Payer: 59 | Source: Ambulatory Visit

## 2015-12-02 ENCOUNTER — Other Ambulatory Visit (HOSPITAL_COMMUNITY)
Admission: RE | Admit: 2015-12-02 | Discharge: 2015-12-02 | Disposition: A | Discharge status: Home / Self Care | Payer: 59 | Source: Ambulatory Visit | Attending: Obstetrics and Gynecology | Admitting: Obstetrics and Gynecology

## 2015-12-02 DIAGNOSIS — Z780 Asymptomatic menopausal state: Secondary | ICD-10-CM | POA: Insufficient documentation

## 2015-12-02 LAB — T4, FREE: Free T4: 0.78 ng/dL (ref 0.61–1.12)

## 2015-12-02 LAB — TSH: TSH: 3.466 u[IU]/mL (ref 0.350–4.500)

## 2015-12-03 LAB — T4: T4, Total: 6.4 ug/dL (ref 4.5–12.0)

## 2015-12-03 LAB — FOLLICLE STIMULATING HORMONE: FSH: 13.1 m[IU]/mL

## 2015-12-03 LAB — T3 UPTAKE: T3 Uptake Ratio: 22 % — ABNORMAL LOW (ref 24–39)

## 2015-12-07 MED FILL — ORENCIA 125 MG/ML SYRINGE: 125 | 28 days supply | Qty: 4 | Fill #2

## 2015-12-07 MED FILL — LEFLUNOMIDE 20 MG TABLET: 20 | 30 days supply | Qty: 30 | Fill #2

## 2015-12-14 DIAGNOSIS — R102 Pelvic and perineal pain: Secondary | ICD-10-CM | POA: Diagnosis not present

## 2015-12-27 DIAGNOSIS — N841 Polyp of cervix uteri: Secondary | ICD-10-CM | POA: Diagnosis not present

## 2016-01-04 MED FILL — TRI-PREVIFEM TABLET: 0.18/0.215/ | 84 days supply | Qty: 84 | Fill #0

## 2016-01-12 DIAGNOSIS — M1611 Unilateral primary osteoarthritis, right hip: Secondary | ICD-10-CM | POA: Diagnosis not present

## 2016-01-12 DIAGNOSIS — M170 Bilateral primary osteoarthritis of knee: Secondary | ICD-10-CM | POA: Diagnosis not present

## 2016-01-12 DIAGNOSIS — M0579 Rheumatoid arthritis with rheumatoid factor of multiple sites without organ or systems involvement: Secondary | ICD-10-CM | POA: Diagnosis not present

## 2016-01-12 DIAGNOSIS — Z79899 Other long term (current) drug therapy: Secondary | ICD-10-CM | POA: Diagnosis not present

## 2016-01-19 MED FILL — LEFLUNOMIDE 20 MG TABLET: 20 | 30 days supply | Qty: 30 | Fill #0

## 2016-01-19 MED FILL — ORENCIA 125 MG/ML SYRINGE: 125 | 28 days supply | Qty: 4 | Fill #3

## 2016-01-27 DIAGNOSIS — Z3202 Encounter for pregnancy test, result negative: Secondary | ICD-10-CM | POA: Diagnosis not present

## 2016-01-27 DIAGNOSIS — N858 Other specified noninflammatory disorders of uterus: Secondary | ICD-10-CM | POA: Diagnosis not present

## 2016-01-27 DIAGNOSIS — N840 Polyp of corpus uteri: Secondary | ICD-10-CM | POA: Diagnosis not present

## 2016-01-27 MED FILL — OXYCODONE W/APAP 5/325 TAB: 5-325 | 4 days supply | Qty: 20 | Fill #0

## 2016-02-23 ENCOUNTER — Other Ambulatory Visit: Payer: Self-pay | Admitting: Internal Medicine

## 2016-02-23 MED FILL — LEFLUNOMIDE 20 MG TABLET: 20 | 30 days supply | Qty: 30 | Fill #1

## 2016-02-29 ENCOUNTER — Other Ambulatory Visit: Payer: Self-pay | Admitting: Pharmacist

## 2016-02-29 MED ORDER — ABATACEPT 125 MG/ML ~~LOC~~ SOSY: 125.0000 mg | PREFILLED_SYRINGE | SUBCUTANEOUS | 3 refills | Status: DC

## 2016-02-29 MED FILL — ORENCIA 125 MG/ML SYRINGE: 125 | 28 days supply | Qty: 4 | Fill #0

## 2016-03-17 ENCOUNTER — Encounter: Payer: Self-pay | Admitting: Pharmacist

## 2016-03-17 NOTE — Progress Notes (Signed)
Chart review for specialty medication use.   Patient is currently taking Orencia for rheumatoid arthritis. Patient is managed by Mariah Milling for this.   Dosing: SubQ: 125 mg subQ once weekly.   Drug-drug interactions: none  Screenings: TB screening: has it through work (works in [employer]) Hepatitis Screening: had Hepatitis B vaccine Blood glucose: Orencia contains maltose which make falsely elevate glucose levels  Monitoring: S/sx of infection: none reported S/sx of hypersensitivity: none reported Other adverse effects: none reported   O:    01/12/16 CBC: WNL 01/12/16: Chem: WNL, SCr 0.79  Of note, glucose was 103 but elevation is expected with Orencia due to maltose component.  Patient tolerating Orencia well per last visit note with rheumatology in November 2017. Labs WNL, no recent infections or adverse effects. No recommendations for any changes at this time. Next visit with rheumatology planned for March 2018. Will touch base with patient at next refill.

## 2016-03-27 MED FILL — ORENCIA 125 MG/ML SYRINGE: 125 | 28 days supply | Qty: 4 | Fill #1

## 2016-03-27 MED FILL — LEFLUNOMIDE 20 MG TABLET: 20 | 30 days supply | Qty: 30 | Fill #2

## 2016-03-27 MED FILL — TRI-PREVIFEM TABLET: 0.18/0.215/ | 84 days supply | Qty: 84 | Fill #1

## 2016-04-05 DIAGNOSIS — R319 Hematuria, unspecified: Secondary | ICD-10-CM | POA: Diagnosis not present

## 2016-04-05 DIAGNOSIS — R1031 Right lower quadrant pain: Secondary | ICD-10-CM | POA: Diagnosis not present

## 2016-04-06 ENCOUNTER — Other Ambulatory Visit (HOSPITAL_COMMUNITY): Payer: Self-pay | Admitting: Obstetrics and Gynecology

## 2016-04-06 DIAGNOSIS — N8000 Endometriosis of the uterus, unspecified: Secondary | ICD-10-CM

## 2016-04-10 ENCOUNTER — Ambulatory Visit (HOSPITAL_COMMUNITY)
Admission: RE | Admit: 2016-04-10 | Discharge: 2016-04-10 | Disposition: A | Discharge status: Home / Self Care | Payer: 59 | Source: Ambulatory Visit | Attending: Obstetrics and Gynecology | Admitting: Obstetrics and Gynecology

## 2016-04-10 DIAGNOSIS — N200 Calculus of kidney: Secondary | ICD-10-CM | POA: Insufficient documentation

## 2016-04-10 DIAGNOSIS — K6389 Other specified diseases of intestine: Secondary | ICD-10-CM | POA: Insufficient documentation

## 2016-04-10 DIAGNOSIS — K4090 Unilateral inguinal hernia, without obstruction or gangrene, not specified as recurrent: Secondary | ICD-10-CM | POA: Diagnosis not present

## 2016-04-10 DIAGNOSIS — M4306 Spondylolysis, lumbar region: Secondary | ICD-10-CM | POA: Insufficient documentation

## 2016-04-10 DIAGNOSIS — N8000 Endometriosis of the uterus, unspecified: Secondary | ICD-10-CM

## 2016-04-10 DIAGNOSIS — M1611 Unilateral primary osteoarthritis, right hip: Secondary | ICD-10-CM | POA: Insufficient documentation

## 2016-04-10 MED ORDER — IOPAMIDOL (ISOVUE-300) INJECTION 61%
100.0000 mL | Freq: Once | INTRAVENOUS | Status: AC | PRN
Start: 2016-04-10 — End: 2016-04-10
  Administered 2016-04-10: 100 mL via INTRAVENOUS

## 2016-04-14 ENCOUNTER — Ambulatory Visit (HOSPITAL_COMMUNITY): Payer: 59

## 2016-04-21 ENCOUNTER — Encounter (INDEPENDENT_AMBULATORY_CARE_PROVIDER_SITE_OTHER): Payer: Self-pay

## 2016-04-21 ENCOUNTER — Encounter (INDEPENDENT_AMBULATORY_CARE_PROVIDER_SITE_OTHER): Payer: Self-pay | Admitting: Internal Medicine

## 2016-04-21 DIAGNOSIS — I10 Essential (primary) hypertension: Secondary | ICD-10-CM | POA: Diagnosis not present

## 2016-04-21 DIAGNOSIS — Z6833 Body mass index (BMI) 33.0-33.9, adult: Secondary | ICD-10-CM | POA: Diagnosis not present

## 2016-04-21 MED FILL — AMLODIPINE BESYLATE 5 MG TA: 5 | 90 days supply | Qty: 90 | Fill #0

## 2016-04-25 MED FILL — ORENCIA 125 MG/ML SYRINGE: 125 | 28 days supply | Qty: 4 | Fill #2

## 2016-04-28 DIAGNOSIS — R102 Pelvic and perineal pain: Secondary | ICD-10-CM | POA: Diagnosis not present

## 2016-05-08 ENCOUNTER — Ambulatory Visit (INDEPENDENT_AMBULATORY_CARE_PROVIDER_SITE_OTHER): Payer: 59 | Admitting: Internal Medicine

## 2016-05-08 DIAGNOSIS — M0609 Rheumatoid arthritis without rheumatoid factor, multiple sites: Secondary | ICD-10-CM | POA: Diagnosis not present

## 2016-05-08 DIAGNOSIS — Z79899 Other long term (current) drug therapy: Secondary | ICD-10-CM | POA: Diagnosis not present

## 2016-05-09 MED FILL — traMADol HCL 50 MG TABS: 50 | 30 days supply | Qty: 30 | Fill #0

## 2016-05-10 DIAGNOSIS — Z79899 Other long term (current) drug therapy: Secondary | ICD-10-CM | POA: Diagnosis not present

## 2016-05-10 DIAGNOSIS — M069 Rheumatoid arthritis, unspecified: Secondary | ICD-10-CM | POA: Diagnosis not present

## 2016-05-10 DIAGNOSIS — M0609 Rheumatoid arthritis without rheumatoid factor, multiple sites: Secondary | ICD-10-CM | POA: Diagnosis not present

## 2016-05-10 DIAGNOSIS — I10 Essential (primary) hypertension: Secondary | ICD-10-CM | POA: Diagnosis not present

## 2016-05-10 DIAGNOSIS — E162 Hypoglycemia, unspecified: Secondary | ICD-10-CM | POA: Diagnosis not present

## 2016-05-22 ENCOUNTER — Telehealth (INDEPENDENT_AMBULATORY_CARE_PROVIDER_SITE_OTHER): Payer: Self-pay | Admitting: Internal Medicine

## 2016-05-22 ENCOUNTER — Encounter (INDEPENDENT_AMBULATORY_CARE_PROVIDER_SITE_OTHER): Payer: Self-pay | Admitting: Internal Medicine

## 2016-05-22 ENCOUNTER — Ambulatory Visit (INDEPENDENT_AMBULATORY_CARE_PROVIDER_SITE_OTHER): Payer: 59 | Admitting: Internal Medicine

## 2016-05-22 VITALS — BP 140/92 | HR 64 | Temp 98.1°F | Ht 67.0 in | Wt 200.2 lb

## 2016-05-22 DIAGNOSIS — R933 Abnormal findings on diagnostic imaging of other parts of digestive tract: Secondary | ICD-10-CM

## 2016-05-22 NOTE — Telephone Encounter (Signed)
Note faxed.

## 2016-05-22 NOTE — Patient Instructions (Signed)
Follow up as needed

## 2016-05-22 NOTE — Telephone Encounter (Signed)
Please send a copy of chart to Dr. Richarda Overlie,  [facility] [place]

## 2016-05-22 NOTE — Progress Notes (Signed)
   Subjective:    Patient ID: Jessica Pacheco, female    DOB: 04-Jan-1976, 41 y.o.   MRN: [id number]  HPI  She was last seen in our office in 2013. Hx of C-diff.  She was treated with Flagyl and Cipro.   She underwent a D and C to remove a polyp (cervical). She say after that, the pain became better x 2 weeks.   04/11/2015 CT abdomen/pelvis with CM:  Rt lower pelvic pain.  IMPRESSION: Mild fibrofatty wall thickening of the terminal ileum to proximal ascending colon, nonspecific, without surrounding edema/acute inflammation; this could be the result of a prior inflammatory process or enteritis.   Review of Systems Past Medical History:  Diagnosis Date  . Hemorrhagic cystitis   . Hypoglycemia   . Kidney stones   . Ovarian cyst   . Rheumatoid arthritis(714.0)     Past Surgical History:  Procedure Laterality Date  . KNEE SURGERY     rt, arthroscopy  . WISDOM TOOTH EXTRACTION      No Known Allergies  Current Outpatient Prescriptions on File Prior to Visit  Medication Sig Dispense Refill  . Abatacept (ORENCIA) 125 MG/ML SOSY Inject 125 mg into the skin once a week. 4 mL 3   No current facility-administered medications on file prior to visit.        Objective:   Physical Exam Blood pressure (!) 140/92, pulse 64, temperature 98.1 F (36.7 C), height 5\' 7"  (1.702 m), weight 200 lb 3.2 oz (90.8 kg). Alert and oriented. Skin warm and dry. Oral mucosa is moist.   . Sclera anicteric, conjunctivae is pink. Thyroid not enlarged. No cervical lymphadenopathy. Lungs clear. Heart regular rate and rhythm.  Abdomen is soft. Bowel sounds are positive. No hepatomegaly. No abdominal masses felt. Slight tenderness to inguinal area.  No edema to lower extremities.          Assessment & Plan:  Abnormal CT.  Hx of C-diff in 2013. She is not having abdominal pain now.  No further work up at this time.

## 2016-05-24 DIAGNOSIS — K130 Diseases of lips: Secondary | ICD-10-CM | POA: Diagnosis not present

## 2016-05-24 DIAGNOSIS — I10 Essential (primary) hypertension: Secondary | ICD-10-CM | POA: Diagnosis not present

## 2016-05-24 MED FILL — KETOCONAZOLE 2% CREAM: 2 | 7 days supply | Qty: 15 | Fill #0

## 2016-05-31 MED FILL — ORENCIA 125 MG/ML SYRINGE: 125 | 28 days supply | Qty: 4 | Fill #3

## 2016-06-01 MED FILL — LEFLUNOMIDE 20 MG TABLET: 20 | 30 days supply | Qty: 30 | Fill #0

## 2016-06-09 ENCOUNTER — Ambulatory Visit (HOSPITAL_COMMUNITY)
Admission: RE | Admit: 2016-06-09 | Discharge: 2016-06-09 | Disposition: A | Discharge status: Home / Self Care | Payer: 59 | Source: Ambulatory Visit | Attending: Obstetrics and Gynecology | Admitting: Obstetrics and Gynecology

## 2016-06-09 ENCOUNTER — Other Ambulatory Visit (HOSPITAL_COMMUNITY): Payer: Self-pay | Admitting: Obstetrics and Gynecology

## 2016-06-09 DIAGNOSIS — Z1231 Encounter for screening mammogram for malignant neoplasm of breast: Secondary | ICD-10-CM

## 2016-06-13 DIAGNOSIS — M0609 Rheumatoid arthritis without rheumatoid factor, multiple sites: Secondary | ICD-10-CM | POA: Diagnosis not present

## 2016-06-27 ENCOUNTER — Telehealth: Payer: Self-pay | Admitting: Pharmacist

## 2016-06-27 ENCOUNTER — Other Ambulatory Visit: Payer: Self-pay | Admitting: Pharmacist

## 2016-06-27 MED ORDER — TOFACITINIB CITRATE ER 11 MG PO TB24: 1.0000 | ORAL_TABLET | Freq: Every day | ORAL | 2 refills | Status: DC

## 2016-06-27 MED ORDER — TOFACITINIB CITRATE ER 11 MG PO TB24: 1.0000 | ORAL_TABLET | Freq: Every day | ORAL | 1 refills | Status: DC

## 2016-06-27 MED FILL — XELJANZ XR 11 MG TB24: 11 | 30 days supply | Qty: 30 | Fill #0

## 2016-06-27 NOTE — Telephone Encounter (Addendum)
@  LOGO@  S: Patient presents to [facility] for review of their specialty medication therapy.  Patient is currently taking Xeljanz for rheumatoid arthritis. She has been on Orencia but has failed it and has been switched to [place].  Adherence: has not started  Efficacy: has not started  Dosing:  Rheumatoid arthritis (monotherapy or in combination with nonbiologic disease-modifying antirheumatic drugs (DMARDs): Oral: Extended release: 11 mg once daily  Monitoring: S/sx of infection: denies S/sx of malignancy: denies Bone marrow suppression: denies GI adverse effects: n/a Headache: n/a LFTs: WNL per patient Cardiovascular effects (decreased HR): WNL per patient   O: @FLOWAMB (5)@   Lab Results  Component Value Date   WBC 5.9 07/27/2011   HGB 12.0 07/27/2011   HCT 38.1 07/27/2011   MCV 92.9 07/27/2011   PLT 282 07/27/2011    @LASTCHEM @  A/P: 1. Medication review: patient prescribed Xeljanz for rheumatoid arthritis. Reviewed the medications with the patient including the following: Xeljanz (tofacitinib) is a Janus Associated Kinase Inhibitor which prevents cytokine- or growth factor-medicated gene expression and intracellular activity of immune cells, reduces circulating CD16/56+ natural killer cells, serum IgG, IgM, IgA, and C-reactive protein, and increases B cells. Adverse effects include increased risk of infection, risk of malignancy, as well as bone marrow suppression, GI upset, and decreased heart rate. . Patient should avoid live vaccinations while on this medication. No recommendations for changes at this time   09/26/2011, PharmD, BCPS, BCACP, CPP Clinical Pharmacist Practitioner  [facility] and [facility] [phone]  Evaluation and management procedures were performed by the Clinical Pharmacy Practitioner under my supervision and collaboration. I have reviewed the Practitioner's note and chart, and I agree with the management and  plan as documented above.   [facility], MD, MHA, CPE, FACP, FAAP [facility] and [facility]ness [place], [facility] [place]   06/27/2016, 1:58 PM

## 2016-06-27 NOTE — Telephone Encounter (Signed)
Patient has failed Orencia and rheumatology has ordered Harriette Ohara. Called patient to follow up on change. Unable to reach her, left a HIPAA-compliant voicemail requesting that she call me back.

## 2016-06-27 NOTE — Telephone Encounter (Deleted)
Evaluation and management procedures were performed by the Clinical Pharmacy Practitioner under my supervision and collaboration. I have reviewed the Practitioner's note and chart, and I agree with the management and plan as documented above.   Jeanann Lewandowsky, MD, MHA, CPE, FACP, FAAP [facility] and Wellness [place], [place] [phone]   06/27/2016, 1:58 PM

## 2016-07-06 MED FILL — LEFLUNOMIDE 20 MG TABLET: 20 | 30 days supply | Qty: 30 | Fill #1

## 2016-07-18 MED FILL — AMLODIPINE BESYLATE 5 MG TA: 5 | 90 days supply | Qty: 90 | Fill #1

## 2016-07-19 MED FILL — XELJANZ XR 11 MG TB24: 11 | 30 days supply | Qty: 30 | Fill #1

## 2016-08-01 MED FILL — LEFLUNOMIDE 20 MG TABLET: 20 | 30 days supply | Qty: 30 | Fill #2

## 2016-08-09 DIAGNOSIS — M0609 Rheumatoid arthritis without rheumatoid factor, multiple sites: Secondary | ICD-10-CM | POA: Diagnosis not present

## 2016-08-09 DIAGNOSIS — Z79899 Other long term (current) drug therapy: Secondary | ICD-10-CM | POA: Diagnosis not present

## 2016-08-25 MED FILL — XELJANZ XR 11 MG TB24: 11 | 30 days supply | Qty: 30 | Fill #2

## 2016-08-30 DIAGNOSIS — E539 Vitamin B deficiency, unspecified: Secondary | ICD-10-CM | POA: Diagnosis not present

## 2016-08-30 DIAGNOSIS — Z79899 Other long term (current) drug therapy: Secondary | ICD-10-CM | POA: Diagnosis not present

## 2016-08-31 MED FILL — LEFLUNOMIDE 20 MG TABLET: 20 | 30 days supply | Qty: 30 | Fill #0

## 2016-09-04 DIAGNOSIS — I10 Essential (primary) hypertension: Secondary | ICD-10-CM | POA: Diagnosis not present

## 2016-09-04 DIAGNOSIS — M069 Rheumatoid arthritis, unspecified: Secondary | ICD-10-CM | POA: Diagnosis not present

## 2016-09-28 DIAGNOSIS — M0609 Rheumatoid arthritis without rheumatoid factor, multiple sites: Secondary | ICD-10-CM | POA: Diagnosis not present

## 2016-10-02 MED FILL — LEFLUNOMIDE 20 MG TABLET: 20 | 30 days supply | Qty: 30 | Fill #1

## 2016-10-09 MED FILL — AMLODIPINE BESYLATE 5 MG TA: 5 | 90 days supply | Qty: 90 | Fill #2

## 2016-10-30 MED FILL — LEFLUNOMIDE 20 MG TABLET: 20 | 30 days supply | Qty: 30 | Fill #2

## 2016-11-15 DIAGNOSIS — M0609 Rheumatoid arthritis without rheumatoid factor, multiple sites: Secondary | ICD-10-CM | POA: Diagnosis not present

## 2016-11-15 DIAGNOSIS — Z79899 Other long term (current) drug therapy: Secondary | ICD-10-CM | POA: Diagnosis not present

## 2016-11-20 ENCOUNTER — Other Ambulatory Visit: Payer: Self-pay | Admitting: Pharmacist

## 2016-11-20 ENCOUNTER — Ambulatory Visit: Payer: Self-pay | Attending: Internal Medicine | Admitting: Pharmacist

## 2016-11-20 DIAGNOSIS — Z79899 Other long term (current) drug therapy: Secondary | ICD-10-CM

## 2016-11-20 MED ORDER — TOCILIZUMAB 162 MG/0.9ML ~~LOC~~ SOSY: PREFILLED_SYRINGE | SUBCUTANEOUS | 5 refills | Status: DC

## 2016-11-20 NOTE — Progress Notes (Addendum)
   S: Patient presents to [facility] for review of their specialty medication therapy.  Patient is currently taking Actemra for rheumatoid arthritis. Patient is managed by Dr. Cardell Peach for this.   She has failed Enbrel, Humira, Orencia, and Xeljanz.   Adherence: has not started yet.  Efficacy: has not started yet  Dosing: 162 mg every other week.   Dose adjustments: Renal: no dose adjustments (has not been studied) Hepatic: no dose adjustments (has not been studied)  Drug-drug interactions:none   Screening: TB test: completed per patient Hepatitis: completed per patient  Monitoring: S/sx of infection: denies, otherwise no s/sx of any pertinent monitoring parameters since patient has not started drug yet.   O:     Chemistry   Component Value Date/Time  NA 142 08/09/2016 1625  NA 141 06/12/2013 1559  K 4.2 08/09/2016 1625  K 4.1 06/12/2013 1559  CL 104 08/09/2016 1625  CL 102 06/12/2013 1559  CO2 22 08/09/2016 1625  CO2 22 06/12/2013 1559  BUN 12 08/09/2016 1625  BUN 12 06/12/2013 1559  CREATININE 0.76 08/09/2016 1625  GLUCOSE 120 (H) 08/09/2016 1625  GLUCOSE 79 06/12/2013 1559   Component Value Date/Time  CALCIUM 9.0 08/09/2016 1625  ALKPHOS 70 08/09/2016 1625  ALKPHOS 51 06/12/2013 1559  AST 18 08/09/2016 1625  AST 25 06/12/2013 1559  ALT 15 08/09/2016 1625  ALT 18 06/12/2013 1559  BILITOT 0.9 08/09/2016 1625  BILITOT 0.6 06/12/2013 1559    Lab Results  Component Value Date  WBC 5.9 08/09/2016  Hemoglobin 12.2 08/09/2016  Hematocrit 38.0 08/09/2016  MCV 91 08/09/2016  Platelet Count 275 08/09/2016    A/P: 1. Medication review: Patient currently on Actemra for rheumatoid arthritis. She has not started the medication yet. Tocilizumab is an antagonist of the interleukin-6 (IL-6) receptor. Endogenous IL-6 is induced by inflammatory stimuli and mediates a variety of immunological responses. Inhibition of IL-6 receptors by  tocilizumab leads to a reduction in cytokine and acute phase reactant production. Possible adverse effects include: increase risk of infection, malignancy, hyperlipidemia, injection site reaction, GI perforation, and thrombocytopenia/neutropenia. Patient to closely follow with rheumatologist. No recommendations for any changes.    Alvino Blood, PharmD, BCPS, BCACP, CPP Clinical Pharmacist Practitioner  [facility] and [facility]ness [phone]       '

## 2016-11-21 MED FILL — ACTEMRA 162 MG/0.9 ML SYRN: 162 | 28 days supply | Qty: 2 | Fill #0

## 2016-12-04 MED FILL — LEFLUNOMIDE 20 MG TABLET: 20 | 30 days supply | Qty: 30 | Fill #3

## 2016-12-07 DIAGNOSIS — I10 Essential (primary) hypertension: Secondary | ICD-10-CM | POA: Diagnosis not present

## 2016-12-21 MED FILL — ACTEMRA 162 MG/0.9 ML SYRN: 162 | 28 days supply | Qty: 2 | Fill #1

## 2017-01-03 DIAGNOSIS — N912 Amenorrhea, unspecified: Secondary | ICD-10-CM | POA: Diagnosis not present

## 2017-01-03 DIAGNOSIS — Z01419 Encounter for gynecological examination (general) (routine) without abnormal findings: Secondary | ICD-10-CM | POA: Diagnosis not present

## 2017-01-03 DIAGNOSIS — Z6833 Body mass index (BMI) 33.0-33.9, adult: Secondary | ICD-10-CM | POA: Diagnosis not present

## 2017-01-03 MED FILL — LEFLUNOMIDE 20 MG TABLET: 20 | 30 days supply | Qty: 30 | Fill #0

## 2017-01-10 MED FILL — AMLODIPINE BESYLATE 5 MG TA: 5 | 90 days supply | Qty: 90 | Fill #3

## 2017-01-15 MED FILL — ACTEMRA 162 MG/0.9 ML SYRN: 162 | 28 days supply | Qty: 2 | Fill #2

## 2017-01-18 DIAGNOSIS — E039 Hypothyroidism, unspecified: Secondary | ICD-10-CM | POA: Diagnosis not present

## 2017-01-18 MED FILL — LEVOTHYROXINE 50 MCG TABLET: 50 | 90 days supply | Qty: 90 | Fill #0

## 2017-01-22 NOTE — H&P (Signed)
Addendum:   1.  Hx RA on Leflunamide and Actemra  2. Hypothyroidism>>>on Levothyroxine

## 2017-01-22 NOTE — H&P (Signed)
Jessica Pacheco  DICTATION # [id number] CSN# [id number]   Meriel Pica, MD 01/22/2017 2:29 PM

## 2017-01-23 NOTE — H&P (Signed)
NAMEREITA, Jessica Pacheco               ACCOUNT NO.:  [id number]  MEDICAL RECORD NO.:  [id number]  LOCATION:                                 FACILITY:  PHYSICIAN:  Duke Salvia. Marcelle Overlie, M.D.    DATE OF BIRTH:  DATE OF CONSULTATION: DATE OF DISCHARGE:                                CONSULTATION   CHIEF COMPLAINT:  Chronic right lower quadrant pain.  HISTORY OF PRESENT ILLNESS:  A 41 year old, G1, P1, using condoms for contraception.  A year ago, she had D and C with [facility] with resection of a polyp, all of which was benign.  Over the last year, she has had persistent right lower quadrant pain that has been evaluated both by ultrasound last February, 2018, which was normal and then a CT done on February, 2018 that showed a left inguinal hernia containing fat, mild fibrofatty wall thickening of the terminal ileum.  Uterus, tubes, ovaries, and pelvis otherwise normal.  In the interim, she has seen both General surgery and GI, who did not feel like any further treatment was needed at this time.  Yet her right lower quadrant pain persists.  She presents at this time for diagnostic laparoscopy to further elucidate the source of her pain, endometriosis, adhesive disease.  This procedure including specific risks related to bleeding, infection, adjacent organ injury, the need for further surgery, along with her expected recovery time, discussed with her which she understands and accepts.  On November, 2018; [facility] was 11.6.  Thyroid profile was done too that showed an elevated TSH and she is in the midst of having evaluation by her medical doctor.  PAST MEDICAL HISTORY:  ALLERGIES:  None.  CURRENT MEDICATIONS:  None.  FAMILY HISTORY:  Significant for history of headache, heart disease, thyroid disease, gallbladder disease, UTI, osteoporosis, diverticulosis, diabetes, hypertension, and both uterine and lung cancer.  SURGICAL HISTORY:  She has had wisdom teeth extraction, right  knee surgery, and one vaginal delivery.  SOCIAL HISTORY:  Denies alcohol, tobacco, or drug use.  She is married. Dr. Ouida Sills is her medical doctor.  PHYSICAL EXAMINATION:  VITAL SIGNS:  Temp 98.2, blood pressure 120/78. HEENT:  Unremarkable. NECK:  Supple without masses. LUNGS:  Clear. CARDIOVASCULAR:  Regular rate and rhythm without murmurs, rubs, gallops noted. BREASTS:  Without masses. ABDOMEN:  Soft, flat, nontender.  Vulva, vagina, cervix normal.  Uterus mid position, nontender.  Adnexa without masses or nodularity. EXTREMITIES: Unremarkable. NEUROLOGIC:  Unremarkable.  IMPRESSION:  Chronic right lower quadrant pain, evaluation as above.  PLAN:  Diagnostic laparoscopy.  Procedure and risks discussed as above.     Romulus Hanrahan M. Marcelle Overlie, M.D.   ______________________________ Duke Salvia. Marcelle Overlie, M.D.    [facility]MED[facility]  D:  01/22/2017  T:  01/23/2017  Job:  [id number]

## 2017-02-05 DIAGNOSIS — R102 Pelvic and perineal pain: Secondary | ICD-10-CM | POA: Diagnosis not present

## 2017-02-05 MED FILL — LEFLUNOMIDE 20 MG TABLET: 20 | 30 days supply | Qty: 30 | Fill #1

## 2017-02-07 ENCOUNTER — Other Ambulatory Visit (HOSPITAL_COMMUNITY): Payer: Self-pay | Admitting: Obstetrics and Gynecology

## 2017-02-07 DIAGNOSIS — J988 Other specified respiratory disorders: Secondary | ICD-10-CM

## 2017-02-08 ENCOUNTER — Encounter (HOSPITAL_BASED_OUTPATIENT_CLINIC_OR_DEPARTMENT_OTHER): Payer: Self-pay | Admitting: *Deleted

## 2017-02-08 ENCOUNTER — Ambulatory Visit (HOSPITAL_COMMUNITY)
Admission: RE | Admit: 2017-02-08 | Discharge: 2017-02-08 | Disposition: A | Discharge status: Home / Self Care | Payer: 59 | Source: Ambulatory Visit | Attending: Obstetrics and Gynecology | Admitting: Obstetrics and Gynecology

## 2017-02-08 ENCOUNTER — Other Ambulatory Visit: Payer: Self-pay

## 2017-02-08 DIAGNOSIS — M50323 Other cervical disc degeneration at C6-C7 level: Secondary | ICD-10-CM | POA: Insufficient documentation

## 2017-02-08 DIAGNOSIS — J988 Other specified respiratory disorders: Secondary | ICD-10-CM

## 2017-02-08 DIAGNOSIS — M069 Rheumatoid arthritis, unspecified: Secondary | ICD-10-CM | POA: Diagnosis not present

## 2017-02-08 DIAGNOSIS — Z01818 Encounter for other preprocedural examination: Secondary | ICD-10-CM | POA: Insufficient documentation

## 2017-02-08 NOTE — Progress Notes (Signed)
SPOKE W/ PT VIA PHONE FOR PER-OP INTERVIEW.   ARRIVE AT 0530.  NEEDS ISTAT, URINE PREG.  AND EKG.

## 2017-02-16 ENCOUNTER — Ambulatory Visit (HOSPITAL_BASED_OUTPATIENT_CLINIC_OR_DEPARTMENT_OTHER): Payer: 59 | Admitting: Anesthesiology

## 2017-02-16 ENCOUNTER — Encounter (HOSPITAL_BASED_OUTPATIENT_CLINIC_OR_DEPARTMENT_OTHER): Payer: Self-pay | Admitting: *Deleted

## 2017-02-16 ENCOUNTER — Other Ambulatory Visit: Payer: Self-pay

## 2017-02-16 ENCOUNTER — Ambulatory Visit (HOSPITAL_BASED_OUTPATIENT_CLINIC_OR_DEPARTMENT_OTHER)
Admission: RE | Admit: 2017-02-16 | Discharge: 2017-02-16 | Disposition: A | Discharge status: Home / Self Care | Payer: 59 | Source: Ambulatory Visit | Attending: Obstetrics and Gynecology | Admitting: Obstetrics and Gynecology

## 2017-02-16 ENCOUNTER — Encounter (HOSPITAL_BASED_OUTPATIENT_CLINIC_OR_DEPARTMENT_OTHER)
Admission: RE | Disposition: A | Discharge status: Home / Self Care | Payer: Self-pay | Source: Ambulatory Visit | Attending: Obstetrics and Gynecology

## 2017-02-16 DIAGNOSIS — K219 Gastro-esophageal reflux disease without esophagitis: Secondary | ICD-10-CM | POA: Diagnosis not present

## 2017-02-16 DIAGNOSIS — Z79899 Other long term (current) drug therapy: Secondary | ICD-10-CM | POA: Diagnosis not present

## 2017-02-16 DIAGNOSIS — I10 Essential (primary) hypertension: Secondary | ICD-10-CM | POA: Diagnosis not present

## 2017-02-16 DIAGNOSIS — R102 Pelvic and perineal pain: Secondary | ICD-10-CM | POA: Diagnosis not present

## 2017-02-16 DIAGNOSIS — M069 Rheumatoid arthritis, unspecified: Secondary | ICD-10-CM | POA: Diagnosis not present

## 2017-02-16 DIAGNOSIS — N803 Endometriosis of pelvic peritoneum: Secondary | ICD-10-CM | POA: Insufficient documentation

## 2017-02-16 DIAGNOSIS — R112 Nausea with vomiting, unspecified: Secondary | ICD-10-CM | POA: Diagnosis not present

## 2017-02-16 DIAGNOSIS — R195 Other fecal abnormalities: Secondary | ICD-10-CM | POA: Diagnosis not present

## 2017-02-16 DIAGNOSIS — R1030 Lower abdominal pain, unspecified: Secondary | ICD-10-CM | POA: Diagnosis not present

## 2017-02-16 HISTORY — DX: Other cervical disc degeneration, unspecified cervical region: M50.30

## 2017-02-16 HISTORY — DX: Personal history of urinary calculi: Z87.442

## 2017-02-16 HISTORY — DX: Other chronic pain: G89.29

## 2017-02-16 HISTORY — DX: Personal history of other endocrine, nutritional and metabolic disease: Z86.39

## 2017-02-16 HISTORY — DX: Right lower quadrant pain: R10.31

## 2017-02-16 HISTORY — DX: Stress incontinence (female) (male): N39.3

## 2017-02-16 HISTORY — PX: LAPAROSCOPY: SHX197

## 2017-02-16 HISTORY — DX: Unspecified osteoarthritis, unspecified site: M19.90

## 2017-02-16 HISTORY — DX: Other specified postprocedural states: Z98.890

## 2017-02-16 HISTORY — DX: Other specified postprocedural states: R11.2

## 2017-02-16 HISTORY — DX: Gastro-esophageal reflux disease without esophagitis: K21.9

## 2017-02-16 HISTORY — DX: Essential (primary) hypertension: I10

## 2017-02-16 HISTORY — DX: Hypothyroidism, unspecified: E03.9

## 2017-02-16 LAB — POCT I-STAT, CHEM 8
BUN: 14 mg/dL (ref 6–20)
CHLORIDE: 104 mmol/L (ref 101–111)
CREATININE: 0.7 mg/dL (ref 0.44–1.00)
Calcium, Ion: 0.9 mmol/L — ABNORMAL LOW (ref 1.15–1.40)
Glucose, Bld: 102 mg/dL — ABNORMAL HIGH (ref 65–99)
HEMATOCRIT: 42 % (ref 36.0–46.0)
HEMOGLOBIN: 14.3 g/dL (ref 12.0–15.0)
POTASSIUM: 3.3 mmol/L — AB (ref 3.5–5.1)
Sodium: 142 mmol/L (ref 135–145)
TCO2: 24 mmol/L (ref 22–32)

## 2017-02-16 LAB — POCT PREGNANCY, URINE: PREG TEST UR: NEGATIVE

## 2017-02-16 SURGERY — LAPAROSCOPY, DIAGNOSTIC
Anesthesia: General

## 2017-02-16 MED ORDER — BUPIVACAINE HCL (PF) 0.25 % IJ SOLN
INTRAMUSCULAR | Status: DC | PRN
  Administered 2017-02-16: 10 mL

## 2017-02-16 MED ORDER — ROCURONIUM BROMIDE 50 MG/5ML IV SOSY
PREFILLED_SYRINGE | INTRAVENOUS | Status: AC
  Filled 2017-02-16: qty 5

## 2017-02-16 MED ORDER — SCOPOLAMINE 1 MG/3DAYS TD PT72
MEDICATED_PATCH | TRANSDERMAL | Status: AC
  Filled 2017-02-16: qty 1

## 2017-02-16 MED ORDER — SCOPOLAMINE 1 MG/3DAYS TD PT72
1.0000 | MEDICATED_PATCH | TRANSDERMAL | Status: DC
  Administered 2017-02-16: 1.5 mg via TRANSDERMAL
  Filled 2017-02-16: qty 1

## 2017-02-16 MED ORDER — SUGAMMADEX SODIUM 200 MG/2ML IV SOLN
INTRAVENOUS | Status: AC
  Filled 2017-02-16: qty 2

## 2017-02-16 MED ORDER — DEXAMETHASONE SODIUM PHOSPHATE 10 MG/ML IJ SOLN
INTRAMUSCULAR | Status: AC
  Filled 2017-02-16: qty 1

## 2017-02-16 MED ORDER — IBUPROFEN 200 MG PO TABS: 800.0000 mg | ORAL_TABLET | Freq: Three times a day (TID) | ORAL | 0 refills | Status: DC | PRN

## 2017-02-16 MED ORDER — FENTANYL CITRATE (PF) 100 MCG/2ML IJ SOLN
INTRAMUSCULAR | Status: DC | PRN
  Administered 2017-02-16: 50 ug via INTRAVENOUS
  Administered 2017-02-16: 25 ug via INTRAVENOUS
  Administered 2017-02-16: 50 ug via INTRAVENOUS
  Administered 2017-02-16 (×3): 25 ug via INTRAVENOUS

## 2017-02-16 MED ORDER — LACTATED RINGERS IV SOLN
INTRAVENOUS | Status: DC
  Administered 2017-02-16 (×3): via INTRAVENOUS
  Filled 2017-02-16: qty 1000

## 2017-02-16 MED ORDER — LIDOCAINE 2% (20 MG/ML) 5 ML SYRINGE
INTRAMUSCULAR | Status: AC
  Filled 2017-02-16: qty 5

## 2017-02-16 MED ORDER — FENTANYL CITRATE (PF) 100 MCG/2ML IJ SOLN
INTRAMUSCULAR | Status: AC
  Filled 2017-02-16: qty 2

## 2017-02-16 MED ORDER — SUGAMMADEX SODIUM 200 MG/2ML IV SOLN
INTRAVENOUS | Status: DC | PRN
  Administered 2017-02-16: 200 mg via INTRAVENOUS

## 2017-02-16 MED ORDER — PROPOFOL 10 MG/ML IV BOLUS
INTRAVENOUS | Status: DC | PRN
  Administered 2017-02-16: 200 mg via INTRAVENOUS

## 2017-02-16 MED ORDER — ONDANSETRON HCL 4 MG/2ML IJ SOLN
INTRAMUSCULAR | Status: DC | PRN
  Administered 2017-02-16: 4 mg via INTRAVENOUS

## 2017-02-16 MED ORDER — DEXAMETHASONE SODIUM PHOSPHATE 4 MG/ML IJ SOLN
INTRAMUSCULAR | Status: DC | PRN
  Administered 2017-02-16: 10 mg via INTRAVENOUS

## 2017-02-16 MED ORDER — MIDAZOLAM HCL 2 MG/2ML IJ SOLN
INTRAMUSCULAR | Status: AC
  Filled 2017-02-16: qty 2

## 2017-02-16 MED ORDER — ONDANSETRON HCL 4 MG/2ML IJ SOLN
INTRAMUSCULAR | Status: AC
  Filled 2017-02-16: qty 2

## 2017-02-16 MED ORDER — ONDANSETRON HCL 4 MG/2ML IJ SOLN
4.0000 mg | Freq: Once | INTRAMUSCULAR | Status: DC | PRN
  Filled 2017-02-16: qty 2

## 2017-02-16 MED ORDER — ROCURONIUM BROMIDE 100 MG/10ML IV SOLN
INTRAVENOUS | Status: DC | PRN
  Administered 2017-02-16: 50 mg via INTRAVENOUS

## 2017-02-16 MED ORDER — HYDROMORPHONE HCL 1 MG/ML IJ SOLN
0.2500 mg | INTRAMUSCULAR | Status: DC | PRN
  Filled 2017-02-16: qty 0.5

## 2017-02-16 MED ORDER — MIDAZOLAM HCL 5 MG/5ML IJ SOLN
INTRAMUSCULAR | Status: DC | PRN
  Administered 2017-02-16 (×2): 2 mg via INTRAVENOUS

## 2017-02-16 MED ORDER — OXYCODONE-ACETAMINOPHEN 5-325 MG PO TABS: 1.0000 | ORAL_TABLET | Freq: Four times a day (QID) | ORAL | 0 refills | Status: DC | PRN

## 2017-02-16 MED ORDER — KETOROLAC TROMETHAMINE 30 MG/ML IJ SOLN
INTRAMUSCULAR | Status: DC | PRN
  Administered 2017-02-16: 30 mg via INTRAVENOUS

## 2017-02-16 MED ORDER — ONDANSETRON 8 MG PO TBDP
8.0000 mg | ORAL_TABLET | Freq: Three times a day (TID) | ORAL | 0 refills | Status: DC | PRN
Start: 2017-02-16 — End: 2018-02-04

## 2017-02-16 MED ORDER — LIDOCAINE HCL (CARDIAC) 20 MG/ML IV SOLN
INTRAVENOUS | Status: DC | PRN
  Administered 2017-02-16: 100 mg via INTRAVENOUS

## 2017-02-16 MED ORDER — PROPOFOL 10 MG/ML IV BOLUS
INTRAVENOUS | Status: AC
  Filled 2017-02-16: qty 40

## 2017-02-16 MED ORDER — MEPERIDINE HCL 25 MG/ML IJ SOLN
6.2500 mg | INTRAMUSCULAR | Status: DC | PRN
  Filled 2017-02-16: qty 1

## 2017-02-16 MED FILL — ONDANSETRON ODT 8 MG TABLET: 8 | 7 days supply | Qty: 20 | Fill #0

## 2017-02-16 MED FILL — OXYCODONE-ACETAMINOPHEN 5-3: 5-325 | 3 days supply | Qty: 20 | Fill #0

## 2017-02-16 SURGICAL SUPPLY — 53 items
APPLICATOR COTTON TIP 6IN STRL (MISCELLANEOUS) ×3 IMPLANT
BAG RETRIEVAL 10MM (BASKET)
BLADE CLIPPER SURG (BLADE) IMPLANT
CANISTER SUCT 3000ML PPV (MISCELLANEOUS) IMPLANT
CATH FOLEY 2WAY SLVR  5CC 14FR (CATHETERS)
CATH FOLEY 2WAY SLVR 5CC 14FR (CATHETERS) IMPLANT
CATH ROBINSON RED A/P 16FR (CATHETERS) ×3 IMPLANT
CLOSURE WOUND 1/4X4 (GAUZE/BANDAGES/DRESSINGS)
COVER MAYO STAND STRL (DRAPES) ×3 IMPLANT
DERMABOND ADVANCED (GAUZE/BANDAGES/DRESSINGS) ×2
DERMABOND ADVANCED .7 DNX12 (GAUZE/BANDAGES/DRESSINGS) ×1 IMPLANT
DRSG COVADERM PLUS 2X2 (GAUZE/BANDAGES/DRESSINGS) ×3 IMPLANT
DRSG OPSITE POSTOP 3X4 (GAUZE/BANDAGES/DRESSINGS) IMPLANT
DURAPREP 26ML APPLICATOR (WOUND CARE) ×3 IMPLANT
ELECT REM PT RETURN 9FT ADLT (ELECTROSURGICAL) ×3
ELECTRODE REM PT RTRN 9FT ADLT (ELECTROSURGICAL) ×1 IMPLANT
FILTER SMOKE EVAC LAPAROSHD (FILTER) IMPLANT
GLOVE BIO SURGEON STRL SZ7 (GLOVE) ×6 IMPLANT
GOWN STRL REUS W/ TWL LRG LVL3 (GOWN DISPOSABLE) ×2 IMPLANT
GOWN STRL REUS W/TWL LRG LVL3 (GOWN DISPOSABLE) ×4
KIT RM TURNOVER CYSTO AR (KITS) ×3 IMPLANT
NEEDLE INSUFFLATION 14GA 120MM (NEEDLE) ×3 IMPLANT
NEEDLE INSUFFLATION 14GA 150MM (NEEDLE) IMPLANT
NS IRRIG 500ML POUR BTL (IV SOLUTION) ×3 IMPLANT
PACK LAPAROSCOPY BASIN (CUSTOM PROCEDURE TRAY) ×3 IMPLANT
PACK TRENDGUARD 450 HYBRID PRO (MISCELLANEOUS) IMPLANT
PACK TRENDGUARD 600 HYBRD PROC (MISCELLANEOUS) IMPLANT
PAD OB MATERNITY 4.3X12.25 (PERSONAL CARE ITEMS) ×3 IMPLANT
PAD PREP 24X48 CUFFED NSTRL (MISCELLANEOUS) ×3 IMPLANT
PENCIL BUTTON HOLSTER BLD 10FT (ELECTRODE) IMPLANT
SCISSORS LAP 5X35 DISP (ENDOMECHANICALS) IMPLANT
SEALER TISSUE G2 CVD JAW 35 (ENDOMECHANICALS) IMPLANT
SEALER TISSUE G2 CVD JAW 45CM (ENDOMECHANICALS) IMPLANT
SET IRRIG TUBING LAPAROSCOPIC (IRRIGATION / IRRIGATOR) IMPLANT
SHEARS HARMONIC ACE PLUS 45CM (MISCELLANEOUS) IMPLANT
STRIP CLOSURE SKIN 1/4X4 (GAUZE/BANDAGES/DRESSINGS) IMPLANT
SUT VICRYL 0 UR6 27IN ABS (SUTURE) IMPLANT
SUT VICRYL RAPIDE 4/0 PS 2 (SUTURE) ×3 IMPLANT
SYR 3ML 23GX1 SAFETY (SYRINGE) IMPLANT
SYRINGE 10CC LL (SYRINGE) ×3 IMPLANT
SYS BAG RETRIEVAL 10MM (BASKET)
SYSTEM BAG RETRIEVAL 10MM (BASKET) IMPLANT
TOWEL OR 17X24 6PK STRL BLUE (TOWEL DISPOSABLE) ×6 IMPLANT
TRAY FOLEY CATH SILVER 14FR (SET/KITS/TRAYS/PACK) IMPLANT
TRENDGUARD 450 HYBRID PRO PACK (MISCELLANEOUS)
TRENDGUARD 600 HYBRID PROC PK (MISCELLANEOUS)
TROCAR 5M 150ML BLDLS (TROCAR) IMPLANT
TROCAR OPTI TIP 5M 100M (ENDOMECHANICALS) ×3 IMPLANT
TROCAR XCEL BLUNT TIP 100MML (ENDOMECHANICALS) IMPLANT
TROCAR XCEL DIL TIP R 11M (ENDOMECHANICALS) ×3 IMPLANT
TUBING INSUF HEATED (TUBING) ×3 IMPLANT
WARMER LAPAROSCOPE (MISCELLANEOUS) ×3 IMPLANT
WATER STERILE IRR 500ML POUR (IV SOLUTION) ×3 IMPLANT

## 2017-02-16 NOTE — Transfer of Care (Signed)
Immediate Anesthesia Transfer of Care Note  Patient: Jessica Pacheco  Procedure(s) Performed: Procedure(s) (LRB): LAPAROSCOPY DIAGNOSTIC (N/A)  Patient Location: PACU  Anesthesia Type: General  Level of Consciousness: awake, sedated, patient cooperative and responds to stimulation  Airway & Oxygen Therapy: Patient Spontanous Breathing and Patient connected to face mask oxygen  Post-op Assessment: Report given to PACU RN, Post -op Vital signs reviewed and stable and Patient moving all extremities  Post vital signs: Reviewed and stable  Complications: No apparent anesthesia complications

## 2017-02-16 NOTE — Anesthesia Preprocedure Evaluation (Signed)
Anesthesia Evaluation  Patient identified by MRN, date of birth, ID band Patient awake    History of Anesthesia Complications (+) PONV  Airway Mallampati: II  TM Distance: >3 FB Neck ROM: Full    Dental   Pulmonary    Pulmonary exam normal        Cardiovascular hypertension, Pt. on medications Normal cardiovascular exam     Neuro/Psych    GI/Hepatic GERD  Medicated and Controlled,  Endo/Other    Renal/GU      Musculoskeletal  (+) Arthritis , Rheumatoid disorders,    Abdominal   Peds  Hematology   Anesthesia Other Findings   Reproductive/Obstetrics                             Anesthesia Physical Anesthesia Plan  ASA: II  Anesthesia Plan: General   Post-op Pain Management:    Induction: Intravenous  PONV Risk Score and Plan: 4 or greater and Scopolamine patch - Pre-op, Dexamethasone, Ondansetron and Midazolam  Airway Management Planned: Oral ETT  Additional Equipment:   Intra-op Plan:   Post-operative Plan: Extubation in OR  Informed Consent: I have reviewed the patients History and Physical, chart, labs and discussed the procedure including the risks, benefits and alternatives for the proposed anesthesia with the patient or authorized representative who has indicated his/her understanding and acceptance.     Plan Discussed with: CRNA and Surgeon  Anesthesia Plan Comments:         Anesthesia Quick Evaluation

## 2017-02-16 NOTE — Anesthesia Procedure Notes (Signed)
Procedure Name: Intubation Date/Time: 02/16/2017 7:32 AM Performed by: Justice Rocher, CRNA Pre-anesthesia Checklist: Patient identified, Emergency Drugs available, Suction available and Patient being monitored Patient Re-evaluated:Patient Re-evaluated prior to induction Oxygen Delivery Method: Circle system utilized Preoxygenation: Pre-oxygenation with 100% oxygen Induction Type: IV induction Ventilation: Mask ventilation without difficulty Laryngoscope Size: Mac and 4 Grade View: Grade II Tube type: Oral Tube size: 7.5 mm Number of attempts: 1 Airway Equipment and Method: Stylet and Oral airway Placement Confirmation: ETT inserted through vocal cords under direct vision,  positive ETCO2 and breath sounds checked- equal and bilateral Secured at: 21 cm Tube secured with: Tape Dental Injury: Teeth and Oropharynx as per pre-operative assessment

## 2017-02-16 NOTE — Op Note (Signed)
Preoperative diagnosis: Chronic pelvic pain, right worse than left  Postoperative diagnosis: Mild pelvic endometriosis  Procedure: Diagnostic laparoscopy  Surgeon: Marcelle Overlie  Anesthesia: General  EBL: Less than 10 cc  Procedure and findings:  The patient was taken to the operating room after an adequate level of general anesthesia was obtained with the patient's legs in stirrups the abdomen perineum and vagina were prepped and draped, bladder was drained, this was done after appropriate timeouts were taken.  The uterus was mid to posterior normal size, mobile, adnexa negative.  Conn cannula was positioned.  Attention directed to the abdomen where the subumbilical area was infiltrated with quarter percent Marcaine plain, a small incision was made and the Veress needle was then introduced without difficulty.  Its intra-abdominal position was verified by with pressure water testing.  After a 2-1/2 L pneumoperitoneum was then created, laparoscopic trocar and sleeve were introduced without difficulty.  There was no evidence of any bleeding or trauma.  3 fingerbreadths above the symphysis in the midline a 5 mm trocar was inserted under direct visualization, the patient was then placed in Trendelenburg and the pelvic findings as follows:  The liver edge upper abdomen and appendix pericecal areas all appeared to be normal the descending colon area was unremarkable.  The uterus itself was normal size, mobile the serosa appeared to be normal was retroflexed the anterior peritoneal area unremarkable.  There were some minimal endometriosis behind the right ovary right pelvic sidewall with a few filmy adhesions noted around the right ovary, that side was otherwise unremarkable.  On the left there was a small functional appearing 1 cm cyst on the ovary left tube was slightly tortuous and minimally dilated no significant suspicion for endometriosis on that side.  The cul-de-sac including the uterosacral ligament areas  appear to be normal.  These findings were photo documented, instruments were removed, the gas allowed to escape, the umbilical incision closed with 4-0 Monocryl subcuticular with Dermabond on the lower she tolerated this well went to recovery room in good condition.  Dictated with Dragon Medical 1  Meriel Pica MD

## 2017-02-16 NOTE — Progress Notes (Signed)
The patient was re-examined with no change in status 

## 2017-02-16 NOTE — Discharge Instructions (Signed)

## 2017-02-18 NOTE — Anesthesia Postprocedure Evaluation (Signed)
Anesthesia Post Note  Patient: Jessica Pacheco  Procedure(s) Performed: LAPAROSCOPY DIAGNOSTIC (N/A )     Patient location during evaluation: PACU Anesthesia Type: General Level of consciousness: awake and alert Pain management: pain level controlled Vital Signs Assessment: post-procedure vital signs reviewed and stable Respiratory status: spontaneous breathing, nonlabored ventilation, respiratory function stable and patient connected to nasal cannula oxygen Cardiovascular status: blood pressure returned to baseline and stable Postop Assessment: no apparent nausea or vomiting Anesthetic complications: no    Last Vitals:  Vitals:   02/16/17 0945 02/16/17 1045  BP: 109/69 115/69  Pulse: 76 77  Resp: 13 16  Temp:  37 C  SpO2: 98% 100%    Last Pain:  Vitals:   02/16/17 0529  TempSrc: Oral                 Arayah Krouse DAVID

## 2017-02-19 ENCOUNTER — Encounter (HOSPITAL_BASED_OUTPATIENT_CLINIC_OR_DEPARTMENT_OTHER): Payer: Self-pay | Admitting: Obstetrics and Gynecology

## 2017-02-26 DIAGNOSIS — E039 Hypothyroidism, unspecified: Secondary | ICD-10-CM | POA: Diagnosis not present

## 2017-02-28 DIAGNOSIS — M0609 Rheumatoid arthritis without rheumatoid factor, multiple sites: Secondary | ICD-10-CM | POA: Diagnosis not present

## 2017-02-28 DIAGNOSIS — M1611 Unilateral primary osteoarthritis, right hip: Secondary | ICD-10-CM | POA: Diagnosis not present

## 2017-02-28 DIAGNOSIS — Z79899 Other long term (current) drug therapy: Secondary | ICD-10-CM | POA: Diagnosis not present

## 2017-02-28 DIAGNOSIS — M170 Bilateral primary osteoarthritis of knee: Secondary | ICD-10-CM | POA: Diagnosis not present

## 2017-02-28 MED FILL — MELOXICAM 7.5 MG TABLET: 7.5 | 30 days supply | Qty: 60 | Fill #0

## 2017-03-05 DIAGNOSIS — E039 Hypothyroidism, unspecified: Secondary | ICD-10-CM | POA: Diagnosis not present

## 2017-03-05 DIAGNOSIS — I10 Essential (primary) hypertension: Secondary | ICD-10-CM | POA: Diagnosis not present

## 2017-03-05 MED FILL — LEVOTHYROXINE 75 MCG TABLET: 75 | 30 days supply | Qty: 30 | Fill #0

## 2017-03-07 MED FILL — LEFLUNOMIDE 20 MG TABLET: 20 | 30 days supply | Qty: 30 | Fill #2

## 2017-03-12 MED FILL — ACTEMRA 162 MG/0.9 ML SYRN: 162 | 28 days supply | Qty: 2 | Fill #3

## 2017-03-14 MED FILL — ORILISSA 150 MG TABS: 150 | 28 days supply | Qty: 28 | Fill #0

## 2017-03-19 ENCOUNTER — Other Ambulatory Visit (HOSPITAL_COMMUNITY): Payer: Self-pay | Admitting: Internal Medicine

## 2017-03-19 ENCOUNTER — Ambulatory Visit (HOSPITAL_COMMUNITY)
Admission: RE | Admit: 2017-03-19 | Discharge: 2017-03-19 | Disposition: A | Discharge status: Home / Self Care | Payer: 59 | Source: Ambulatory Visit | Attending: Internal Medicine | Admitting: Internal Medicine

## 2017-03-19 DIAGNOSIS — M25561 Pain in right knee: Secondary | ICD-10-CM | POA: Insufficient documentation

## 2017-03-19 DIAGNOSIS — M25461 Effusion, right knee: Secondary | ICD-10-CM | POA: Diagnosis not present

## 2017-04-04 MED FILL — AMLODIPINE BESYLATE 5 MG TA: 5 | 90 days supply | Qty: 90 | Fill #4

## 2017-04-04 MED FILL — LEFLUNOMIDE 20 MG TABLET: 20 | 30 days supply | Qty: 30 | Fill #3

## 2017-04-09 DIAGNOSIS — Z79899 Other long term (current) drug therapy: Secondary | ICD-10-CM | POA: Diagnosis not present

## 2017-04-09 DIAGNOSIS — M0609 Rheumatoid arthritis without rheumatoid factor, multiple sites: Secondary | ICD-10-CM | POA: Diagnosis not present

## 2017-04-09 DIAGNOSIS — M25561 Pain in right knee: Secondary | ICD-10-CM | POA: Diagnosis not present

## 2017-04-10 DIAGNOSIS — Z79899 Other long term (current) drug therapy: Secondary | ICD-10-CM | POA: Diagnosis not present

## 2017-04-10 DIAGNOSIS — E039 Hypothyroidism, unspecified: Secondary | ICD-10-CM | POA: Diagnosis not present

## 2017-04-10 MED FILL — ORILISSA 150 MG TABS: 150 | 28 days supply | Qty: 28 | Fill #1

## 2017-04-10 MED FILL — ACTEMRA 162 MG/0.9 ML SYRN: 162 | 28 days supply | Qty: 2 | Fill #4

## 2017-04-17 DIAGNOSIS — I10 Essential (primary) hypertension: Secondary | ICD-10-CM | POA: Diagnosis not present

## 2017-04-17 DIAGNOSIS — E039 Hypothyroidism, unspecified: Secondary | ICD-10-CM | POA: Diagnosis not present

## 2017-04-17 MED FILL — LEVOTHYROXINE 88 MCG TABLET: 88 | 90 days supply | Qty: 90 | Fill #0

## 2017-05-04 MED FILL — LEFLUNOMIDE 20 MG TABLET: 20 | 30 days supply | Qty: 30 | Fill #0

## 2017-05-07 MED FILL — ACTEMRA 162 MG/0.9 ML SYRN: 162 | 28 days supply | Qty: 2 | Fill #5

## 2017-05-10 DIAGNOSIS — N809 Endometriosis, unspecified: Secondary | ICD-10-CM | POA: Diagnosis not present

## 2017-05-12 DIAGNOSIS — J029 Acute pharyngitis, unspecified: Secondary | ICD-10-CM | POA: Diagnosis not present

## 2017-05-14 MED FILL — MELOXICAM 7.5 MG TABLET: 7.5 | 30 days supply | Qty: 60 | Fill #1

## 2017-05-16 MED FILL — ORILISSA 200 MG TABS: 200 | 28 days supply | Qty: 56 | Fill #0

## 2017-05-21 DIAGNOSIS — J019 Acute sinusitis, unspecified: Secondary | ICD-10-CM | POA: Diagnosis not present

## 2017-06-04 MED FILL — LEFLUNOMIDE 20 MG TABLET: 20 | 30 days supply | Qty: 30 | Fill #1

## 2017-06-05 ENCOUNTER — Other Ambulatory Visit: Payer: Self-pay | Admitting: Pharmacist

## 2017-06-05 MED ORDER — TOCILIZUMAB 162 MG/0.9ML ~~LOC~~ SOSY: PREFILLED_SYRINGE | SUBCUTANEOUS | 2 refills | Status: DC

## 2017-06-07 DIAGNOSIS — N809 Endometriosis, unspecified: Secondary | ICD-10-CM | POA: Diagnosis not present

## 2017-06-08 MED FILL — ACTEMRA 162 MG/0.9 ML SYRN: 162 | 28 days supply | Qty: 2 | Fill #0

## 2017-06-18 DIAGNOSIS — M0609 Rheumatoid arthritis without rheumatoid factor, multiple sites: Secondary | ICD-10-CM | POA: Diagnosis not present

## 2017-06-18 DIAGNOSIS — M19072 Primary osteoarthritis, left ankle and foot: Secondary | ICD-10-CM | POA: Diagnosis not present

## 2017-06-18 DIAGNOSIS — Z79899 Other long term (current) drug therapy: Secondary | ICD-10-CM | POA: Diagnosis not present

## 2017-06-18 MED FILL — ORILISSA 200 MG TABS: 200 | 28 days supply | Qty: 56 | Fill #1

## 2017-06-27 DIAGNOSIS — E039 Hypothyroidism, unspecified: Secondary | ICD-10-CM | POA: Diagnosis not present

## 2017-07-05 DIAGNOSIS — I10 Essential (primary) hypertension: Secondary | ICD-10-CM | POA: Diagnosis not present

## 2017-07-05 DIAGNOSIS — E039 Hypothyroidism, unspecified: Secondary | ICD-10-CM | POA: Diagnosis not present

## 2017-07-05 MED FILL — LEFLUNOMIDE 20 MG TABLET: 20 | 30 days supply | Qty: 30 | Fill #2

## 2017-07-05 MED FILL — AMLODIPINE BESYLATE 5 MG TA: 5 | 90 days supply | Qty: 90 | Fill #0

## 2017-07-05 MED FILL — LEVOTHYROXINE 100 MCG TABLE: 100 | 90 days supply | Qty: 90 | Fill #0

## 2017-07-13 MED FILL — ORILISSA 200 MG TABS: 200 | 28 days supply | Qty: 56 | Fill #0

## 2017-07-17 MED FILL — ACTEMRA 162 MG/0.9 ML SYRN: 162 | 28 days supply | Qty: 2 | Fill #1

## 2017-07-31 DIAGNOSIS — M19072 Primary osteoarthritis, left ankle and foot: Secondary | ICD-10-CM | POA: Diagnosis not present

## 2017-07-31 DIAGNOSIS — M792 Neuralgia and neuritis, unspecified: Secondary | ICD-10-CM | POA: Diagnosis not present

## 2017-07-31 DIAGNOSIS — M79671 Pain in right foot: Secondary | ICD-10-CM | POA: Diagnosis not present

## 2017-08-01 MED FILL — MELOXICAM 7.5 MG TABLET: 7.5 | 30 days supply | Qty: 60 | Fill #2

## 2017-08-06 MED FILL — LEFLUNOMIDE 20 MG TABLET: 20 | 30 days supply | Qty: 30 | Fill #3

## 2017-08-08 MED FILL — ACYCLOVIR 400 MG TABLET: 400 | 7 days supply | Qty: 14 | Fill #0

## 2017-08-16 MED FILL — ACTEMRA 162 MG/0.9 ML SYRN: 162 | 28 days supply | Qty: 2 | Fill #2

## 2017-08-20 MED FILL — ORILISSA 200 MG TABS: 200 | 28 days supply | Qty: 56 | Fill #0

## 2017-08-27 DIAGNOSIS — N809 Endometriosis, unspecified: Secondary | ICD-10-CM | POA: Diagnosis not present

## 2017-09-03 ENCOUNTER — Other Ambulatory Visit (HOSPITAL_COMMUNITY): Payer: Self-pay | Admitting: Obstetrics and Gynecology

## 2017-09-03 ENCOUNTER — Ambulatory Visit (HOSPITAL_COMMUNITY)
Admission: RE | Admit: 2017-09-03 | Discharge: 2017-09-03 | Disposition: A | Discharge status: Home / Self Care | Payer: 59 | Source: Ambulatory Visit | Attending: Obstetrics and Gynecology | Admitting: Obstetrics and Gynecology

## 2017-09-03 DIAGNOSIS — Z1231 Encounter for screening mammogram for malignant neoplasm of breast: Secondary | ICD-10-CM

## 2017-09-05 MED FILL — LEFLUNOMIDE 20 MG TABLET: 20 | 30 days supply | Qty: 30 | Fill #0

## 2017-09-17 ENCOUNTER — Other Ambulatory Visit: Payer: Self-pay | Admitting: Internal Medicine

## 2017-09-17 MED FILL — ORILISSA 150 MG TABS: 150 | 28 days supply | Qty: 28 | Fill #0

## 2017-09-18 ENCOUNTER — Other Ambulatory Visit: Payer: Self-pay | Admitting: Pharmacist

## 2017-09-18 MED ORDER — TOCILIZUMAB 162 MG/0.9ML ~~LOC~~ SOSY: PREFILLED_SYRINGE | SUBCUTANEOUS | 1 refills | Status: DC

## 2017-09-18 MED FILL — ACTEMRA 162 MG/0.9 ML SYRN: 162 | 28 days supply | Qty: 2 | Fill #0

## 2017-09-25 DIAGNOSIS — M79671 Pain in right foot: Secondary | ICD-10-CM | POA: Diagnosis not present

## 2017-09-25 DIAGNOSIS — M792 Neuralgia and neuritis, unspecified: Secondary | ICD-10-CM | POA: Diagnosis not present

## 2017-09-28 DIAGNOSIS — E039 Hypothyroidism, unspecified: Secondary | ICD-10-CM | POA: Diagnosis not present

## 2017-09-28 DIAGNOSIS — Z79899 Other long term (current) drug therapy: Secondary | ICD-10-CM | POA: Diagnosis not present

## 2017-10-04 DIAGNOSIS — J329 Chronic sinusitis, unspecified: Secondary | ICD-10-CM | POA: Diagnosis not present

## 2017-10-04 DIAGNOSIS — E039 Hypothyroidism, unspecified: Secondary | ICD-10-CM | POA: Diagnosis not present

## 2017-10-04 MED FILL — AMLODIPINE BESYLATE 5 MG TA: 5 | 90 days supply | Qty: 90 | Fill #1

## 2017-10-04 MED FILL — LEVOTHYROXINE 125 MCG TAB: 125 | 90 days supply | Qty: 90 | Fill #0

## 2017-10-04 MED FILL — LEFLUNOMIDE 20 MG TABLET: 20 | 30 days supply | Qty: 30 | Fill #1

## 2017-10-11 MED FILL — ACTEMRA 162 MG/0.9 ML SYRN: 162 | 28 days supply | Qty: 2 | Fill #1

## 2017-10-15 DIAGNOSIS — M170 Bilateral primary osteoarthritis of knee: Secondary | ICD-10-CM | POA: Diagnosis not present

## 2017-10-15 DIAGNOSIS — M0609 Rheumatoid arthritis without rheumatoid factor, multiple sites: Secondary | ICD-10-CM | POA: Diagnosis not present

## 2017-10-15 DIAGNOSIS — Z79899 Other long term (current) drug therapy: Secondary | ICD-10-CM | POA: Diagnosis not present

## 2017-10-15 MED FILL — OXYCODONE-ACETAMINOPHEN 5-3: 5-325 | 7 days supply | Qty: 20 | Fill #0

## 2017-10-15 MED FILL — CELECOXIB 200 MG CAP: 200 | 30 days supply | Qty: 30 | Fill #0

## 2017-11-01 MED FILL — ORILISSA 150 MG TABS: 150 | 28 days supply | Qty: 28 | Fill #1

## 2017-11-05 ENCOUNTER — Other Ambulatory Visit: Payer: Self-pay | Admitting: Internal Medicine

## 2017-11-07 MED FILL — LEFLUNOMIDE 20 MG TABLET: 20 | 30 days supply | Qty: 30 | Fill #2

## 2017-11-08 ENCOUNTER — Other Ambulatory Visit (HOSPITAL_COMMUNITY)
Admission: RE | Admit: 2017-11-08 | Discharge: 2017-11-08 | Disposition: A | Discharge status: Home / Self Care | Payer: 59 | Source: Ambulatory Visit | Attending: Occupational Medicine | Admitting: Occupational Medicine

## 2017-11-15 ENCOUNTER — Other Ambulatory Visit: Payer: Self-pay | Admitting: Pharmacist

## 2017-11-15 IMAGING — US US TRANSVAGINAL NON-OB
1 series · 13 of 25 positions shown · non-contrast
Comparison: CT on 09/15/2013

CLINICAL DATA: Worsening right lower quadrant and pelvic pain for 1
year. LMP 11/09/2015.

EXAM:
TRANSABDOMINAL AND TRANSVAGINAL ULTRASOUND OF PELVIS
TECHNIQUE: Both transabdominal and transvaginal ultrasound examinations of the
pelvis were performed. Transabdominal technique was performed for
global imaging of the pelvis including uterus, ovaries, adnexal
regions, and pelvic cul-de-sac. It was necessary to proceed with
endovaginal exam following the transabdominal exam to visualize the
endometrium and ovaries.

[Series 1: us transvaginal non-ob · 0.24mm/px · 13 of 112 slices shown]
[im 1/112]
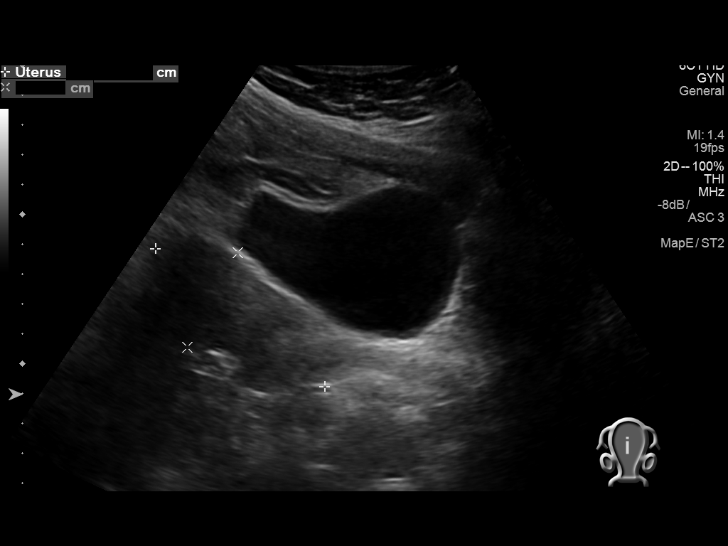
[im 10/112]
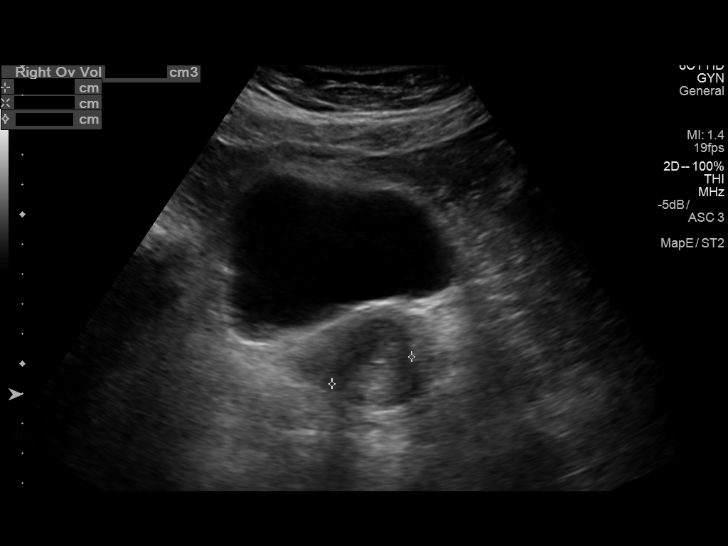
[im 19/112]
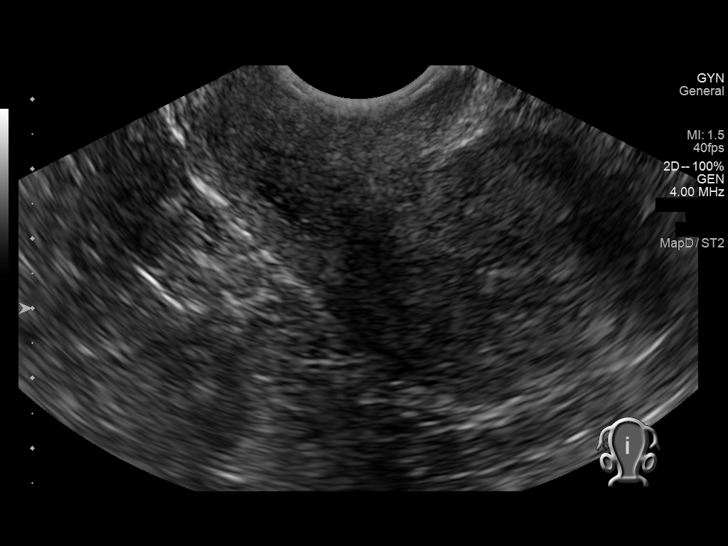
[im 28/112]
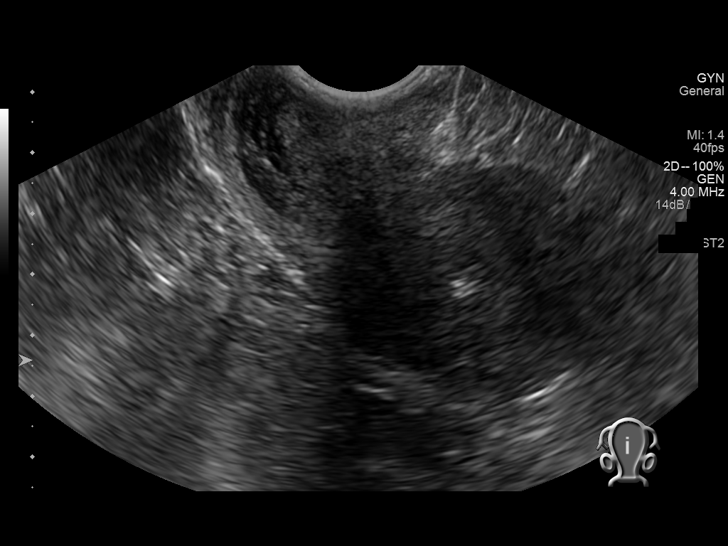
[im 38/112]
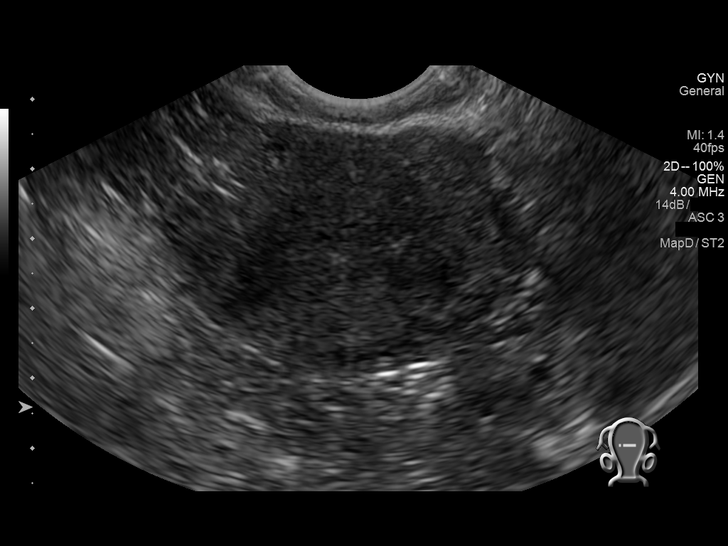
[im 47/112]
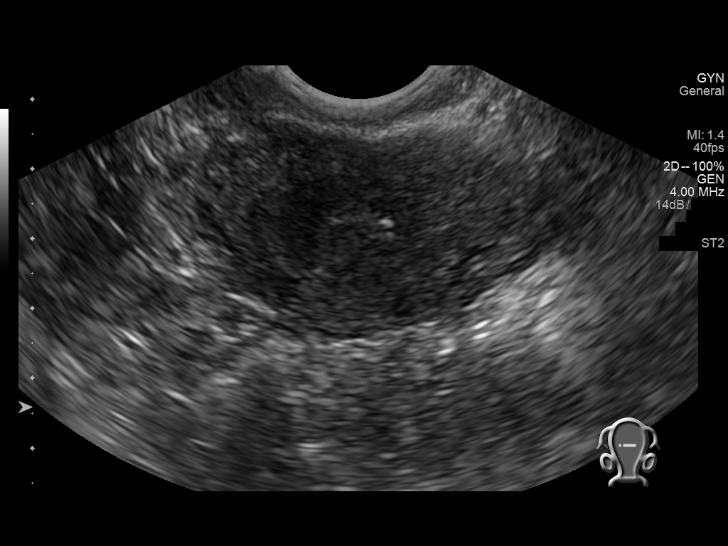
[im 56/112]
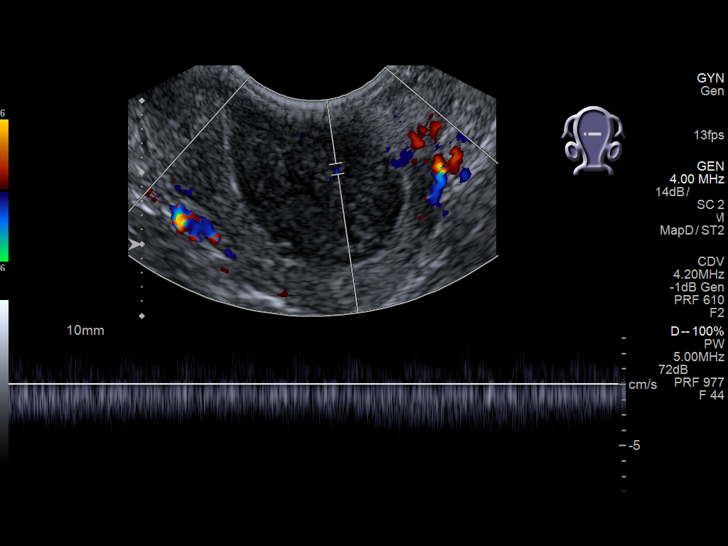
[im 65/112]
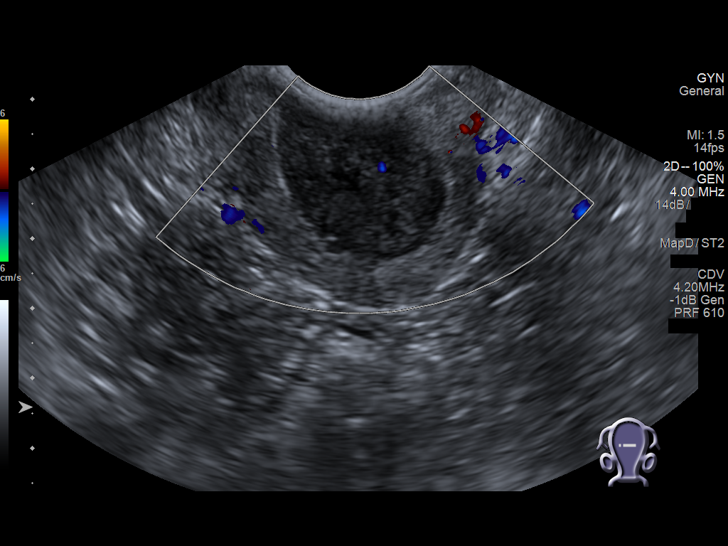
[im 75/112]
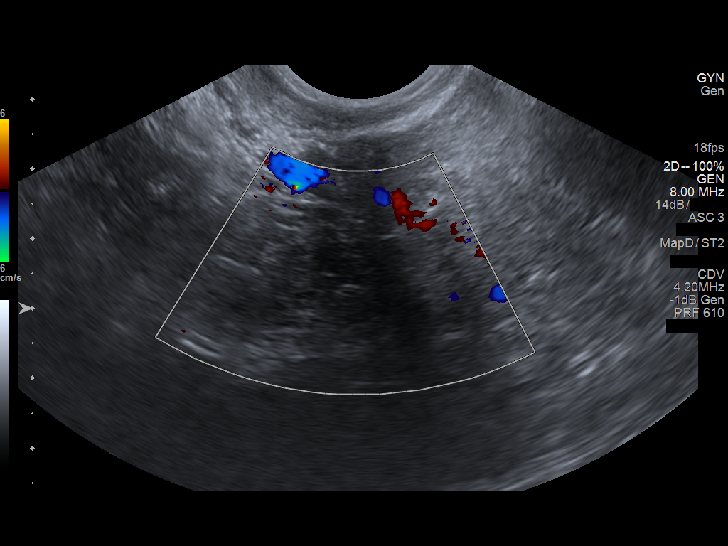
[im 84/112]
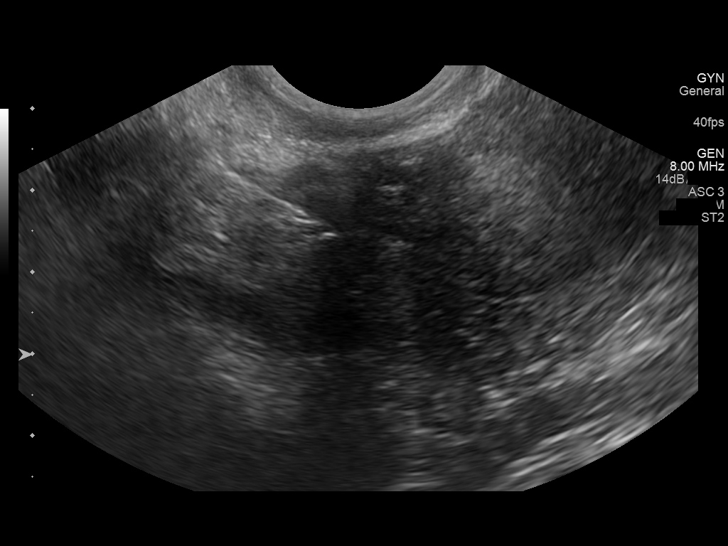
[im 93/112]
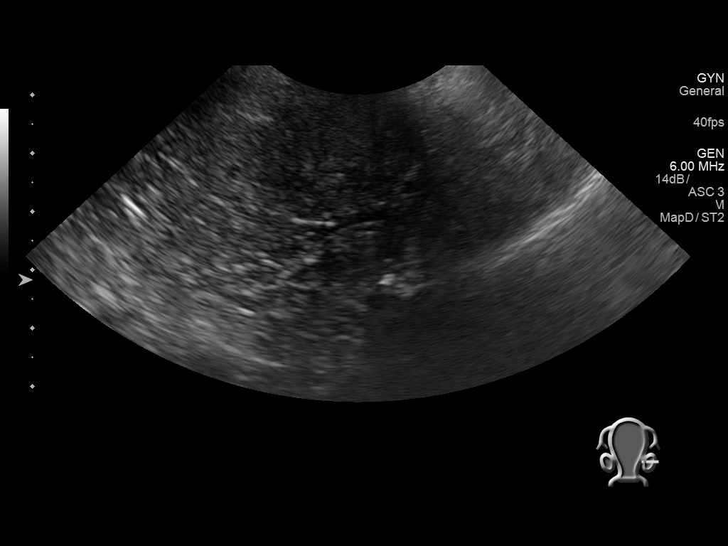
[im 102/112]
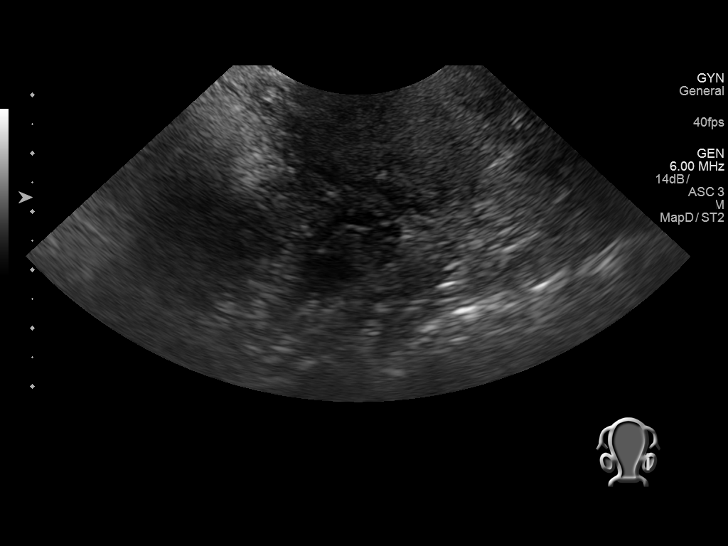
[im 112/112]
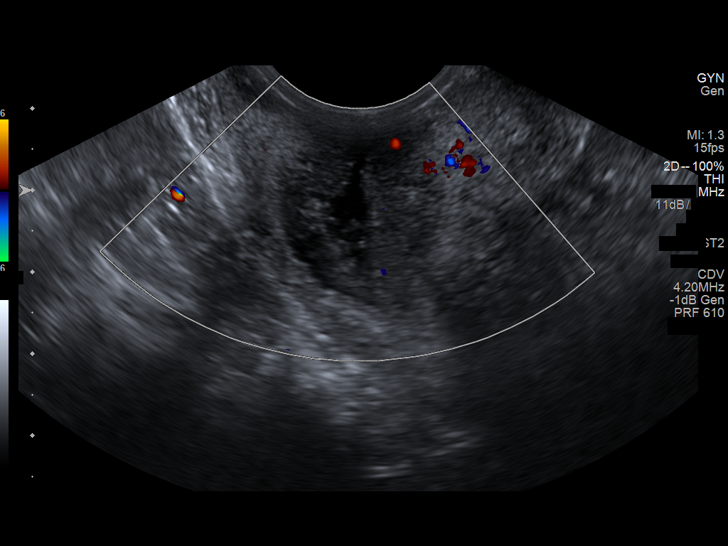

[13 of 25 positions shown; findings below may reference images not displayed]

FINDINGS: Uterus

Measurements: 7.3 x 3.9 x 4.4 cm. Retroverted. No fibroids
identified. Rounded masslike hypoechoic lesion is seen within the
endocervical canal which shows mild internal blood flow on color
Doppler ultrasound. This measures approximately 2.9 x 1.9 x 2.5 cm,
and is suspicious for an endocervical polyp or mass.

Endometrium

Thickness: 3 mm, within normal limits. No focal abnormality
visualized.

Right ovary

Measurements: 1.7 x 1.7 x 1.4 cm. Normal appearance/no adnexal mass.

Left ovary

Measurements: 1.9 x 1.3 x 1.7 cm. Normal appearance/no adnexal mass.

Other findings

No abnormal free fluid.
IMPRESSION: No fibroids identified.

2.9 cm masslike hypoechoic lesion within endocervical canal. This is
suspicious for endocervical polyp or mass. Cervical carcinoma cannot
be excluded. Recommend correlation with pelvic exam ; consider
direct visualization with hysteroscopy or further evaluation with
pelvic MRI without and with contrast.

Normal appearance of both ovaries.  No adnexal mass identified.

## 2017-11-15 MED ORDER — TOCILIZUMAB 162 MG/0.9ML ~~LOC~~ SOSY: PREFILLED_SYRINGE | SUBCUTANEOUS | 1 refills | Status: DC

## 2017-11-15 MED FILL — ACTEMRA 162 MG/0.9 ML SYRN: 162 | 28 days supply | Qty: 2 | Fill #0

## 2017-11-15 MED FILL — CELECOXIB 200 MG CAP: 200 | 30 days supply | Qty: 30 | Fill #1

## 2017-11-26 ENCOUNTER — Telehealth: Payer: Self-pay | Admitting: Pharmacist

## 2017-11-26 NOTE — Telephone Encounter (Signed)
Called patient to schedule an appointment for the [facility]. I was unable to reach the patient so I left a HIPAA-compliant message requesting that the patient return my call.   

## 2017-12-06 MED FILL — LEFLUNOMIDE 20 MG TABLET: 20 | 30 days supply | Qty: 30 | Fill #3

## 2017-12-06 MED FILL — ACTEMRA 162 MG/0.9 ML SYRN: 162 | 28 days supply | Qty: 2 | Fill #1

## 2017-12-11 MED FILL — ORILISSA 150 MG TABS: 150 | 28 days supply | Qty: 28 | Fill #2

## 2018-01-01 DIAGNOSIS — R202 Paresthesia of skin: Secondary | ICD-10-CM | POA: Diagnosis not present

## 2018-01-01 DIAGNOSIS — E039 Hypothyroidism, unspecified: Secondary | ICD-10-CM | POA: Diagnosis not present

## 2018-01-01 MED FILL — CELECOXIB 200 MG CAP: 200 | 30 days supply | Qty: 30 | Fill #2

## 2018-01-02 ENCOUNTER — Other Ambulatory Visit: Payer: Self-pay | Admitting: Pharmacist

## 2018-01-02 MED ORDER — TOCILIZUMAB 162 MG/0.9ML ~~LOC~~ SOSY: PREFILLED_SYRINGE | SUBCUTANEOUS | 1 refills | Status: DC

## 2018-01-02 MED FILL — ACTEMRA 162 MG/0.9 ML SYRN: 162 | 28 days supply | Qty: 2 | Fill #0

## 2018-01-07 DIAGNOSIS — E039 Hypothyroidism, unspecified: Secondary | ICD-10-CM | POA: Diagnosis not present

## 2018-01-07 DIAGNOSIS — Z79899 Other long term (current) drug therapy: Secondary | ICD-10-CM | POA: Diagnosis not present

## 2018-01-07 DIAGNOSIS — I10 Essential (primary) hypertension: Secondary | ICD-10-CM | POA: Diagnosis not present

## 2018-01-07 DIAGNOSIS — M0609 Rheumatoid arthritis without rheumatoid factor, multiple sites: Secondary | ICD-10-CM | POA: Diagnosis not present

## 2018-01-07 MED FILL — AMLODIPINE BESYLATE 10 MG T: 10 | 90 days supply | Qty: 90 | Fill #0

## 2018-01-07 MED FILL — LEVOTHYROXINE 112 MCG TAB: 112 | 90 days supply | Qty: 90 | Fill #0

## 2018-01-09 MED FILL — LEFLUNOMIDE 20 MG TABLET: 20 | 30 days supply | Qty: 30 | Fill #0

## 2018-01-10 ENCOUNTER — Other Ambulatory Visit (HOSPITAL_COMMUNITY): Payer: Self-pay | Admitting: Internal Medicine

## 2018-01-10 DIAGNOSIS — G8929 Other chronic pain: Secondary | ICD-10-CM

## 2018-01-10 DIAGNOSIS — M25561 Pain in right knee: Principal | ICD-10-CM

## 2018-01-14 DIAGNOSIS — M159 Polyosteoarthritis, unspecified: Secondary | ICD-10-CM | POA: Insufficient documentation

## 2018-01-15 ENCOUNTER — Ambulatory Visit (HOSPITAL_COMMUNITY)
Admission: RE | Admit: 2018-01-15 | Discharge: 2018-01-15 | Disposition: A | Discharge status: Home / Self Care | Payer: 59 | Source: Ambulatory Visit | Attending: Internal Medicine | Admitting: Internal Medicine

## 2018-01-15 DIAGNOSIS — R609 Edema, unspecified: Secondary | ICD-10-CM | POA: Insufficient documentation

## 2018-01-15 DIAGNOSIS — M25461 Effusion, right knee: Secondary | ICD-10-CM | POA: Diagnosis not present

## 2018-01-15 DIAGNOSIS — M25561 Pain in right knee: Secondary | ICD-10-CM

## 2018-01-15 DIAGNOSIS — M2391 Unspecified internal derangement of right knee: Secondary | ICD-10-CM | POA: Diagnosis present

## 2018-01-15 DIAGNOSIS — M7641 Tibial collateral bursitis [Pellegrini-Stieda], right leg: Secondary | ICD-10-CM | POA: Insufficient documentation

## 2018-01-15 DIAGNOSIS — M23351 Other meniscus derangements, posterior horn of lateral meniscus, right knee: Secondary | ICD-10-CM | POA: Diagnosis not present

## 2018-01-15 DIAGNOSIS — M23321 Other meniscus derangements, posterior horn of medial meniscus, right knee: Secondary | ICD-10-CM | POA: Diagnosis not present

## 2018-01-15 DIAGNOSIS — M23341 Other meniscus derangements, anterior horn of lateral meniscus, right knee: Secondary | ICD-10-CM | POA: Insufficient documentation

## 2018-01-15 DIAGNOSIS — G8929 Other chronic pain: Secondary | ICD-10-CM

## 2018-01-15 DIAGNOSIS — M0609 Rheumatoid arthritis without rheumatoid factor, multiple sites: Secondary | ICD-10-CM | POA: Insufficient documentation

## 2018-01-16 ENCOUNTER — Ambulatory Visit (HOSPITAL_COMMUNITY): Payer: 59

## 2018-01-21 DIAGNOSIS — Z6837 Body mass index (BMI) 37.0-37.9, adult: Secondary | ICD-10-CM | POA: Diagnosis not present

## 2018-01-21 DIAGNOSIS — Z01419 Encounter for gynecological examination (general) (routine) without abnormal findings: Secondary | ICD-10-CM | POA: Diagnosis not present

## 2018-01-21 MED FILL — ORILISSA 150 MG TABS: 150 | 28 days supply | Qty: 28 | Fill #3

## 2018-02-01 ENCOUNTER — Ambulatory Visit (INDEPENDENT_AMBULATORY_CARE_PROVIDER_SITE_OTHER): Payer: 59

## 2018-02-01 ENCOUNTER — Ambulatory Visit: Payer: 59 | Admitting: Orthopedic Surgery

## 2018-02-01 ENCOUNTER — Encounter: Payer: Self-pay | Admitting: Orthopedic Surgery

## 2018-02-01 ENCOUNTER — Telehealth: Payer: Self-pay | Admitting: Radiology

## 2018-02-01 VITALS — BP 152/95 | HR 105 | Ht 66.5 in | Wt 234.0 lb

## 2018-02-01 DIAGNOSIS — M069 Rheumatoid arthritis, unspecified: Secondary | ICD-10-CM

## 2018-02-01 DIAGNOSIS — M23300 Other meniscus derangements, unspecified lateral meniscus, right knee: Secondary | ICD-10-CM | POA: Diagnosis not present

## 2018-02-01 DIAGNOSIS — M25561 Pain in right knee: Secondary | ICD-10-CM | POA: Diagnosis not present

## 2018-02-01 DIAGNOSIS — G8929 Other chronic pain: Secondary | ICD-10-CM | POA: Diagnosis not present

## 2018-02-01 DIAGNOSIS — M23321 Other meniscus derangements, posterior horn of medial meniscus, right knee: Secondary | ICD-10-CM | POA: Diagnosis not present

## 2018-02-01 NOTE — Progress Notes (Signed)
NEW PATIENT OFFICE VISIT  Chief Complaint  Patient presents with  . Knee Pain    right     42 year old female with rheumatoid arthritis currently on Actemra followed by Dr. Cardell Peach presents for evaluation of catching and locking of the right knee associated with dull aching pain.  She is already had her MRI.  She had knee arthroscopy in 1999  The patient reports several years of pain in her right knee but in September without trauma she started having locking, she has medial pain at times lateral pain at times and occasionally posterior pain.  When the knee is acting up the whole leg will hurt from ankle to hip with more posterior pain.  Occasional swelling although the knee will become hard to bend she has not had any range of motion deficits except when the knee locks in flexion and she has trouble extending it.  She can manipulate the knee back into extension with assistance from her husband  Pain is controlled with tramadol occasional Percocet     Review of Systems  Constitutional: Negative for chills, fever, malaise/fatigue and weight loss.  HENT: Negative.   Musculoskeletal: Positive for back pain and joint pain.  Skin: Negative for rash.  Neurological: Negative for tingling, sensory change and focal weakness.  All other systems reviewed and are negative.    Past Medical History:  Diagnosis Date  . Chronic right lower quadrant pain   . DDD (degenerative disc disease), cervical    c6-7  . GERD (gastroesophageal reflux disease)    WATCHES DIET  . History of hypoglycemia   . History of kidney stones    passed spontaneously  . Hypertension   . Hypothyroidism   . OA (osteoarthritis)    knees , hips, feet  . PONV (postoperative nausea and vomiting)   . Rheumatoid arthritis(714.0) dx  2001  multiple sites   rheumotologist-  dr Molly Maduro gay w/ [facility]--- currently treated w/ oral arava and iv actemra  . SUI (stress urinary incontinence), female     Past Surgical History:   Procedure Laterality Date  . D & C HYSTERECTOMY /  MYOSURE RESECTION OF POLYP  2017     dr Marcelle Overlie  . KNEE ARTHROSCOPY Right 1999  . LAPAROSCOPY N/A 02/16/2017   Procedure: LAPAROSCOPY DIAGNOSTIC;  Surgeon: Richarda Overlie, MD;  Location[facility];  Service: Gynecology;  Laterality: N/A;  . WISDOM TOOTH EXTRACTION      History reviewed. No pertinent family history.  Diabetes hypertension negative   Social History   Tobacco Use  . Smoking status: Never Smoker  . Smokeless tobacco: Never Used  Substance Use Topics  . Alcohol use: No  . Drug use: No    No Known Allergies  Current Meds  Medication Sig  . amLODipine (NORVASC) 10 MG tablet   . calcium carbonate (TUMS - DOSED IN MG ELEMENTAL CALCIUM) 500 MG chewable tablet Chew 1 tablet by mouth as needed for indigestion or heartburn.  . celecoxib (CELEBREX) 200 MG capsule   . Elagolix Sodium (ORILISSA) 150 MG TABS TAKE 1 TABLET BY MOUTH ONCE DAILY AS DIRECTED  . ibuprofen (ADVIL) 200 MG tablet Take 4 tablets (800 mg total) by mouth every 8 (eight) hours as needed for moderate pain.  Marland Kitchen leflunomide (ARAVA) 20 MG tablet Take 20 mg by mouth every evening.  Marland Kitchen levothyroxine (SYNTHROID, LEVOTHROID) 112 MCG tablet   . ondansetron (ZOFRAN ODT) 8 MG disintegrating tablet Take 1 tablet (8 mg total) by mouth every  8 (eight) hours as needed for nausea or vomiting.  Marland Kitchen oxyCODONE-acetaminophen (ROXICET) 5-325 MG tablet Take 1-2 tablets by mouth every 6 (six) hours as needed for severe pain.  . Tocilizumab (ACTEMRA) 162 MG/0.9ML SOSY Inject 162 mg into skin every 14 days    BP (!) 152/95   Pulse (!) 105   Ht 5' 6.5" (1.689 m)   Wt 234 lb (106.1 kg)   BMI 37.20 kg/m   Physical Exam General appearance normal development grooming hygiene She is oriented x3 Mood pleasant affect flat Gait and station supported by a cane  Ortho Exam  Upper extremities show no contracture subluxation atrophy tremors skin lesions or  malalignment neurovascular exam intact  Left knee alignment looks normal no tenderness to palpation full passive range of motion ligaments are stable muscle tone and strength is normal skin is clean no peripheral edema normal sensation  Right lower extremity small joint effusion medial and lateral joint line tenderness with lateral tenderness predominating ballotable patella indicating effusion knee flexion up to 125 degrees no instability detected strength is normal skin is excellent pulses and temperature are normal without edema there is no sensory deficit there are no pathologic reflexes balance is acceptable lower extremity lymph nodes are negative  Provocative tests for meniscal tear positive McMurray's for lateral meniscal tear  MEDICAL DECISION SECTION  Xrays were done at [facility]  She has 45 degrees tibiofemoral alignment asymmetric but medial and lateral joint space narrowing with lateral predominating.  She has some osteophytes on the femur seen on axial view  My independent reading of xrays:  MRI  Medial and lateral meniscal tears are noted with prior meniscectomy noted on the medial side there is some fraying of the free edge of the medial meniscus  The lateral meniscus posterior horn and body are torn  She has mild chondral thinning throughout the tibiofemoral joint surfaces there is mention of a popliteal tendon tear which is most likely synovitis around the tendon    Encounter Diagnoses  Name Primary?  . Chronic pain of right knee   . Derangement of posterior horn of medial meniscus of right knee Yes  . Meniscus, lateral, derangement, right   . Rheumatoid arthritis involving right knee, unspecified rheumatoid factor presence (HCC)     PLAN: (Rx., injectx, surgery, frx, mri/ct) Discussion with the patient we both agree that she needs arthroscopic surgery primarily to address the lateral meniscus, the medial meniscus will probably need  to be trimmed as well  Discussion of common risk factors include swelling infection blood clot.  I discussion with with Dr. Cardell Peach will determine when we can do the surgery based on when she has to stop her rheumatoid arthritis drugs  Expected 4-week recovery  Driving after 1 week  Arthroscopy right knee partial lateral and medial meniscectomy  No orders of the defined types were placed in this encounter.   Fuller Canada, MD  02/01/2018 10:04 AM

## 2018-02-01 NOTE — Addendum Note (Signed)
Addended byCaffie Damme on: 02/01/2018 12:24 PM   Modules accepted: Orders, SmartSet

## 2018-02-01 NOTE — Telephone Encounter (Signed)
Call patient about surgery on 02/07/18 if patients Rheumatologist approves, can post for this day

## 2018-02-01 NOTE — Telephone Encounter (Signed)
I called patient. She has discussed with Rheumatology is good to go for Dec 19th, has instructions for meds from him.

## 2018-02-05 NOTE — Patient Instructions (Addendum)
Jessica Pacheco  02/05/2018     @PREFPERIOPPHARMACY @   Your procedure is scheduled on  02/07/2018 .  Report to [facility] at  740  A.M.  Call this number if you have problems the morning of surgery:  [phone]   Remember:  Do not eat or drink after midnight.                         Take these medicines the morning of surgery with A SIP OF WATER  Amlodipine, orilissa, levothyroxine.    Do not wear jewelry, make-up or nail polish.  Do not wear lotions, powders, or perfumes, or deodorant.  Do not shave 48 hours prior to surgery.  Men may shave face and neck.  Do not bring valuables to the hospital.  [facility] is not responsible for any belongings or valuables.  Contacts, dentures or bridgework may not be worn into surgery.  Leave your suitcase in the car.  After surgery it may be brought to your room.  For patients admitted to the hospital, discharge time will be determined by your treatment team.  Patients discharged the day of surgery will not be allowed to drive home.   Name and phone number of your driver:   family Special instructions:  None  Please read over the following fact sheets that you were given. Anesthesia Post-op Instructions and Care and Recovery After Surgery      Knee Ligament Injury, Arthroscopy Arthroscopy is a surgical technique in which your health care provider examines your knee through a small, pencil-sized telescope (arthroscope). Often, repairs to injured ligaments can be done with instruments in the arthroscope. Arthroscopy is less invasive than open-knee surgery. Tell a health care provider about:  Any allergies you have.  All medicines you are taking, including vitamins, herbs, eye drops, creams, and over-the-counter medicines.  Any problems you or family members have had with anesthetic medicines.  Any blood disorders you have.  Any surgeries you have had.  Any medical conditions you have. What are the  risks? Generally, this is a safe procedure. However, as with any procedure, problems can occur. Possible problems include:  Infection.  Bleeding.  Stiffness.  What happens before the procedure?  Ask your health care provider about changing or stopping any regular medicines. Avoid taking aspirin or blood thinners as directed by your health care provider.  Do not eat or drink anything after midnight the night before surgery.  If you smoke, do not smoke for at least 2 weeks before your surgery.  Do not drink alcohol starting the day before your surgery.  Let your health care provider know if you develop a cold or any infection before your surgery.  Arrange for someone to drive you home after the surgery or after your hospital stay. Also arrange for someone to help you with activities during recovery. What happens during the procedure?  Small monitors will be put on your body. They are used to check your heart, blood pressure, and oxygen levels.  An IV access tube will be put into one of your veins. Medicine will be able to flow directly into your body through this IV tube.  You might be given a medicine to help you relax (sedative).  You will be given a medicine that makes you go to sleep (general anesthetic), and a breathing tube will be placed into your lungs during the procedure.  Several small  incisions are made in your knee. Saline fluid is placed into one of the incisions to expand the knee and clear away any blood in the knee.  Your health care provider will insert the arthroscope to examine the injured knee.  During arthroscopy, your health care provider may find a partial or complete tear in a ligament.  Tools can be inserted through the other incisions to repair the injured ligaments.  The incisions are then closed with absorbable stitches and covered with dressings. What happens after the procedure?  You will be taken to the recovery area where you will be  monitored.  When you are awake, stable, and taking fluids without problems, you will be allowed to go home. This information is not intended to replace advice given to you by your health care provider. Make sure you discuss any questions you have with your health care provider. Document Released: 02/04/2000 Document Revised: 07/15/2015 Document Reviewed: 09/18/2012 Elsevier Interactive Patient Education  2017 [employer]  Arthroscopic Knee Ligament Repair, Care After This sheet gives you information about how to care for yourself after your procedure. Your health care provider may also give you more specific instructions. If you have problems or questions, contact your health care provider. What can I expect after the procedure? After the procedure, it is common to have:  Pain in your knee.  Bruising and swelling on your knee, calf, and ankle for 3-4 days.  Fatigue.  Follow these instructions at home: If you have a brace or immobilizer:  Wear the brace or immobilizer as told by your health care provider. Remove it only as told by your health care provider.  Loosen the splint or immobilizer if your toes tingle, become numb, or turn cold and blue.  Keep the brace or immobilizer clean. Bathing  Do not take baths, swim, or use a hot tub until your health care provider approves. Ask your health care provider if you can take showers.  Keep your bandage (dressing) dry until your health care provider says that it can be removed. Cover it and your brace or immobilizer with a watertight covering when you take a shower. Incision care  Follow instructions from your health care provider about how to take care of your incision. Make sure you: ? Wash your hands with soap and water before you change your bandage (dressing). If soap and water are not available, use hand sanitizer. ? Change your dressing as told by your health care provider. ? Leave stitches (sutures), skin glue, or adhesive  strips in place. These skin closures may need to stay in place for 2 weeks or longer. If adhesive strip edges start to loosen and curl up, you may trim the loose edges. Do not remove adhesive strips completely unless your health care provider tells you to do that.  Check your incision area every day for signs of infection. Check for: ? More redness, swelling, or pain. ? More fluid or blood. ? Warmth. ? Pus or a bad smell. Managing pain, stiffness, and swelling  If directed, put ice on the affected area. ? If you have a removable brace or immobilizer, remove it as told by your health care provider. ? Put ice in a plastic bag. ? Place a towel between your skin and the bag or between your brace or immobilizer and the bag. ? Leave the ice on for 20 minutes, 2-3 times a day.  Move your toes often to avoid stiffness and to lessen swelling.  Raise (elevate) the  injured area above the level of your heart while you are sitting or lying down. Driving  Do not drive until your health care provider approves. If you have a brace or immobilizer on your leg, ask your health care provider when it is safe for you to drive.  Do not drive or use heavy machinery while taking prescription pain medicine. Activity  Rest as directed. Ask your health care provider what activities are safe for you.  Do physical therapy exercises as told by your health care provider. Physical therapy will help you regain strength and motion in your knee.  Follow instructions from your health care provider about: ? When you may start motion exercises. ? When you may start riding a stationary bike and doing other low-impact activities. ? When you may start to jog and do other high-impact activities. Safety  Do not use the injured limb to support your body weight until your health care provider says that you can. Use crutches as told by your health care provider. General instructions  Do not use any products that contain  nicotine or tobacco, such as cigarettes and e-cigarettes. These can delay bone healing. If you need help quitting, ask your health care provider.  To prevent or treat constipation while you are taking prescription pain medicine, your health care provider may recommend that you: ? Drink enough fluid to keep your urine clear or pale yellow. ? Take over-the-counter or prescription medicines. ? Eat foods that are high in fiber, such as fresh fruits and vegetables, whole grains, and beans. ? Limit foods that are high in fat and processed sugars, such as fried and sweet foods.  Take over-the-counter and prescription medicines only as told by your health care provider.  Keep all follow-up visits as told by your health care provider. This is important. Contact a health care provider if:  You have more redness, swelling, or pain around an incision.  You have more fluid or blood coming from an incision.  Your incision feels warm to the touch.  You have a fever.  You have pain or swelling in your knee, and it gets worse.  You have pain that does not get better with medicine. Get help right away if:  You have trouble breathing.  You have pus or a bad smell coming from an incision.  You have numbness and tingling near the knee joint. Summary  After the procedure, it is common to have knee pain with bruising and swelling on your knee, calf, and ankle.  Icing your knee and raising your leg above the level of your heart will help control the pain and the swelling.  Do physical therapy exercises as told by your health care provider. Physical therapy will help you regain strength and motion in your knee. This information is not intended to replace advice given to you by your health care provider. Make sure you discuss any questions you have with your health care provider. Document Released: 11/27/2012 Document Revised: 02/01/2016 Document Reviewed: 02/01/2016 Elsevier Interactive Patient  Education  2017 [employer] Inc.  Monitored Anesthesia Care Anesthesia is a term that refers to techniques, procedures, and medicines that help a person stay safe and comfortable during a medical procedure. Monitored anesthesia care, or sedation, is one type of anesthesia. Your anesthesia specialist may recommend sedation if you will be having a procedure that does not require you to be unconscious, such as:  Cataract surgery.  A dental procedure.  A biopsy.  A colonoscopy.  During the  procedure, you may receive a medicine to help you relax (sedative). There are three levels of sedation:  Mild sedation. At this level, you may feel awake and relaxed. You will be able to follow directions.  Moderate sedation. At this level, you will be sleepy. You may not remember the procedure.  Deep sedation. At this level, you will be asleep. You will not remember the procedure.  The more medicine you are given, the deeper your level of sedation will be. Depending on how you respond to the procedure, the anesthesia specialist may change your level of sedation or the type of anesthesia to fit your needs. An anesthesia specialist will monitor you closely during the procedure. Let your health care provider know about:  Any allergies you have.  All medicines you are taking, including vitamins, herbs, eye drops, creams, and over-the-counter medicines.  Any use of steroids (by mouth or as a cream).  Any problems you or family members have had with sedatives and anesthetic medicines.  Any blood disorders you have.  Any surgeries you have had.  Any medical conditions you have, such as sleep apnea.  Whether you are pregnant or may be pregnant.  Any use of cigarettes, alcohol, or street drugs. What are the risks? Generally, this is a safe procedure. However, problems may occur, including:  Getting too much medicine (oversedation).  Nausea.  Allergic reaction to medicines.  Trouble breathing. If  this happens, a breathing tube may be used to help with breathing. It will be removed when you are awake and breathing on your own.  Heart trouble.  Lung trouble.  Before the procedure Staying hydrated Follow instructions from your health care provider about hydration, which may include:  Up to 2 hours before the procedure - you may continue to drink clear liquids, such as water, clear fruit juice, black coffee, and plain tea.  Eating and drinking restrictions Follow instructions from your health care provider about eating and drinking, which may include:  8 hours before the procedure - stop eating heavy meals or foods such as meat, fried foods, or fatty foods.  6 hours before the procedure - stop eating light meals or foods, such as toast or cereal.  6 hours before the procedure - stop drinking milk or drinks that contain milk.  2 hours before the procedure - stop drinking clear liquids.  Medicines Ask your health care provider about:  Changing or stopping your regular medicines. This is especially important if you are taking diabetes medicines or blood thinners.  Taking medicines such as aspirin and ibuprofen. These medicines can thin your blood. Do not take these medicines before your procedure if your health care provider instructs you not to.  Tests and exams  You will have a physical exam.  You may have blood tests done to show: ? How well your kidneys and liver are working. ? How well your blood can clot.  General instructions  Plan to have someone take you home from the hospital or clinic.  If you will be going home right after the procedure, plan to have someone with you for 24 hours.  What happens during the procedure?  Your blood pressure, heart rate, breathing, level of pain and overall condition will be monitored.  An IV tube will be inserted into one of your veins.  Your anesthesia specialist will give you medicines as needed to keep you comfortable  during the procedure. This may mean changing the level of sedation.  The procedure will be  performed. After the procedure  Your blood pressure, heart rate, breathing rate, and blood oxygen level will be monitored until the medicines you were given have worn off.  Do not drive for 24 hours if you received a sedative.  You may: ? Feel sleepy, clumsy, or nauseous. ? Feel forgetful about what happened after the procedure. ? Have a sore throat if you had a breathing tube during the procedure. ? Vomit. This information is not intended to replace advice given to you by your health care provider. Make sure you discuss any questions you have with your health care provider. Document Released: 11/02/2004 Document Revised: 07/16/2015 Document Reviewed: 05/30/2015 Elsevier Interactive Patient Education  2018 Else[employer] Inc. Monitored Anesthesia Care, Care After These instructions provide you with information about caring for yourself after your procedure. Your health care provider may also give you more specific instructions. Your treatment has been planned according to current medical practices, but problems sometimes occur. Call your health care provider if you have any problems or questions after your procedure. What can I expect after the procedure? After your procedure, it is common to:  Feel sleepy for several hours.  Feel clumsy and have poor balance for several hours.  Feel forgetful about what happened after the procedure.  Have poor judgment for several hours.  Feel nauseous or vomit.  Have a sore throat if you had a breathing tube during the procedure.  Follow these instructions at home: For at least 24 hours after the procedure:   Do not: ? Participate in activities in which you could fall or become injured. ? Drive. ? Use heavy machinery. ? Drink alcohol. ? Take sleeping pills or medicines that cause drowsiness. ? Make important decisions or sign legal documents. ? Take  care of children on your own.  Rest. Eating and drinking  Follow the diet that is recommended by your health care provider.  If you vomit, drink water, juice, or soup when you can drink without vomiting.  Make sure you have little or no nausea before eating solid foods. General instructions  Have a responsible adult stay with you until you are awake and alert.  Take over-the-counter and prescription medicines only as told by your health care provider.  If you smoke, do not smoke without supervision.  Keep all follow-up visits as told by your health care provider. This is important. Contact a health care provider if:  You keep feeling nauseous or you keep vomiting.  You feel light-headed.  You develop a rash.  You have a fever. Get help right away if:  You have trouble breathing. This information is not intended to replace advice given to you by your health care provider. Make sure you discuss any questions you have with your health care provider. Document Released: 05/30/2015 Document Revised: 09/29/2015 Document Reviewed: 05/30/2015 Elsevier Interactive Patient Education  [employer].

## 2018-02-06 ENCOUNTER — Other Ambulatory Visit: Payer: Self-pay

## 2018-02-06 ENCOUNTER — Encounter (HOSPITAL_COMMUNITY): Payer: Self-pay

## 2018-02-06 ENCOUNTER — Encounter (HOSPITAL_COMMUNITY)
Admission: RE | Admit: 2018-02-06 | Discharge: 2018-02-06 | Disposition: A | Discharge status: Home / Self Care | Payer: 59 | Source: Ambulatory Visit | Attending: Orthopedic Surgery | Admitting: Orthopedic Surgery

## 2018-02-06 DIAGNOSIS — E039 Hypothyroidism, unspecified: Secondary | ICD-10-CM | POA: Diagnosis not present

## 2018-02-06 DIAGNOSIS — M659 Synovitis and tenosynovitis, unspecified: Secondary | ICD-10-CM | POA: Diagnosis not present

## 2018-02-06 DIAGNOSIS — Z791 Long term (current) use of non-steroidal anti-inflammatories (NSAID): Secondary | ICD-10-CM | POA: Diagnosis not present

## 2018-02-06 DIAGNOSIS — Z79899 Other long term (current) drug therapy: Secondary | ICD-10-CM | POA: Diagnosis not present

## 2018-02-06 DIAGNOSIS — Z79890 Hormone replacement therapy (postmenopausal): Secondary | ICD-10-CM | POA: Diagnosis not present

## 2018-02-06 DIAGNOSIS — M94261 Chondromalacia, right knee: Secondary | ICD-10-CM | POA: Diagnosis not present

## 2018-02-06 DIAGNOSIS — M25561 Pain in right knee: Secondary | ICD-10-CM | POA: Diagnosis present

## 2018-02-06 DIAGNOSIS — M069 Rheumatoid arthritis, unspecified: Secondary | ICD-10-CM | POA: Diagnosis not present

## 2018-02-06 DIAGNOSIS — S83281A Other tear of lateral meniscus, current injury, right knee, initial encounter: Secondary | ICD-10-CM | POA: Diagnosis not present

## 2018-02-06 DIAGNOSIS — I10 Essential (primary) hypertension: Secondary | ICD-10-CM | POA: Diagnosis not present

## 2018-02-06 DIAGNOSIS — X58XXXA Exposure to other specified factors, initial encounter: Secondary | ICD-10-CM | POA: Diagnosis not present

## 2018-02-06 LAB — PREGNANCY, URINE: Preg Test, Ur: NEGATIVE

## 2018-02-06 NOTE — H&P (Signed)
Preop History and Physical       Chief Complaint  Jessica Pacheco presents with  . Knee Pain      right       42 year old female with rheumatoid arthritis currently on Actemra followed by Dr. Cardell Peach presents for evaluation of catching and locking of the right knee associated with dull aching pain.  She is already had her MRI.  She had knee arthroscopy in 1999   The Jessica Pacheco reports several years of pain in her right knee but in September without trauma she started having locking, she has medial pain at times lateral pain at times and occasionally posterior pain.  When the knee is acting up the whole leg will hurt from ankle to hip with more posterior pain.  Occasional swelling although the knee will become hard to bend she has not had any range of motion deficits except when the knee locks in flexion and she has trouble extending it.  She can manipulate the knee back into extension with assistance from her husband   Pain is controlled with tramadol occasional Percocet         Review of Systems  Constitutional: Negative for chills, fever, malaise/fatigue and weight loss.  HENT: Negative.   Musculoskeletal: Positive for back pain and joint pain.  Skin: Negative for rash.  Neurological: Negative for tingling, sensory change and focal weakness.  All other systems reviewed and are negative.           Past Medical History:  Diagnosis Date  . Chronic right lower quadrant pain    . DDD (degenerative disc disease), cervical      c6-7  . GERD (gastroesophageal reflux disease)      WATCHES DIET  . History of hypoglycemia    . History of kidney stones      passed spontaneously  . Hypertension    . Hypothyroidism    . OA (osteoarthritis)      knees , hips, feet  . PONV (postoperative nausea and vomiting)    . Rheumatoid arthritis(714.0) dx  2001  multiple sites    rheumotologist-  dr Molly Maduro gay w/ [facility]--- currently treated w/ oral arava and iv actemra  . SUI (stress urinary incontinence),  female        Past Surgical History:  Procedure Laterality Date  . D & C HYSTERECTOMY /  MYOSURE RESECTION OF POLYP   2017     dr Marcelle Overlie  . KNEE ARTHROSCOPY Right 1999  . LAPAROSCOPY N/A 02/16/2017    Procedure: LAPAROSCOPY DIAGNOSTIC;  Surgeon: Richarda Overlie, MD;  Location: [facility];  Service: Gynecology;  Laterality: N/A;  . WISDOM TOOTH EXTRACTION          History reviewed. No pertinent family history.  Diabetes hypertension negative     Social History        Tobacco Use  . Smoking status: Never Smoker  . Smokeless tobacco: Never Used  Substance Use Topics  . Alcohol use: No  . Drug use: No      No Known Allergies   Active Medications      Current Meds  Medication Sig  . amLODipine (NORVASC) 10 MG tablet    . calcium carbonate (TUMS - DOSED IN MG ELEMENTAL CALCIUM) 500 MG chewable tablet Chew 1 tablet by mouth as needed for indigestion or heartburn.  . celecoxib (CELEBREX) 200 MG capsule    . Elagolix Sodium (ORILISSA) 150 MG TABS TAKE 1 TABLET BY MOUTH ONCE DAILY AS DIRECTED  .  ibuprofen (ADVIL) 200 MG tablet Take 4 tablets (800 mg total) by mouth every 8 (eight) hours as needed for moderate pain.  Marland Kitchen leflunomide (ARAVA) 20 MG tablet Take 20 mg by mouth every evening.  Marland Kitchen levothyroxine (SYNTHROID, LEVOTHROID) 112 MCG tablet    . ondansetron (ZOFRAN ODT) 8 MG disintegrating tablet Take 1 tablet (8 mg total) by mouth every 8 (eight) hours as needed for nausea or vomiting.  Marland Kitchen oxyCODONE-acetaminophen (ROXICET) 5-325 MG tablet Take 1-2 tablets by mouth every 6 (six) hours as needed for severe pain.  . Tocilizumab (ACTEMRA) 162 MG/0.9ML SOSY Inject 162 mg into skin every 14 days        BP (!) 152/95   Pulse (!) 105   Ht 5' 6.5" (1.689 m)   Wt 234 lb (106.1 kg)   BMI 37.20 kg/m    Physical Exam General appearance normal development grooming hygiene She is oriented x3 Mood pleasant affect flat Gait and station supported by a cane   Ortho Exam     Upper extremities show no contracture subluxation atrophy tremors skin lesions or malalignment neurovascular exam intact   Left knee alignment looks normal no tenderness to palpation full passive range of motion ligaments are stable muscle tone and strength is normal skin is clean no peripheral edema normal sensation   Right lower extremity small joint effusion medial and lateral joint line tenderness with lateral tenderness predominating ballotable patella indicating effusion knee flexion up to 125 degrees no instability detected strength is normal skin is excellent pulses and temperature are normal without edema there is no sensory deficit there are no pathologic reflexes balance is acceptable lower extremity lymph nodes are negative   Provocative tests for meniscal tear positive McMurray's for lateral meniscal tear   MEDICAL DECISION SECTION  Xrays were done at [facility]   She has 45 degrees tibiofemoral alignment asymmetric but medial and lateral joint space narrowing with lateral predominating.  She has some osteophytes on the femur seen on axial view   My independent reading of xrays:  MRI   Medial and lateral meniscal tears are noted with prior meniscectomy noted on the medial side there is some fraying of the free edge of the medial meniscus   The lateral meniscus posterior horn and body are torn   She has mild chondral thinning throughout the tibiofemoral joint surfaces there is mention of a popliteal tendon tear which is most likely synovitis around the tendon           Encounter Diagnoses  Name Primary?  . Chronic pain of right knee    . Derangement of posterior horn of medial meniscus of right knee Yes  . Meniscus, lateral, derangement, right    . Rheumatoid arthritis involving right knee, unspecified rheumatoid factor presence (HCC)        PLAN: (Rx., injectx, surgery, frx, mri/ct) Discussion with the Jessica Pacheco we both agree that she  needs arthroscopic surgery primarily to address the lateral meniscus, the medial meniscus will probably need to be trimmed as well   Discussion of common risk factors include swelling infection blood clot.   I discussion with with Dr. Cardell Peach will determine when we can do the surgery based on when she has to stop her rheumatoid arthritis drugs   Expected 4-week recovery   Driving after 1 week   Arthroscopy right knee partial lateral and medial meniscectomy      Fuller Canada, MD

## 2018-02-07 ENCOUNTER — Ambulatory Visit (HOSPITAL_COMMUNITY): Payer: 59 | Admitting: Anesthesiology

## 2018-02-07 ENCOUNTER — Encounter (HOSPITAL_COMMUNITY)
Admission: RE | Disposition: A | Discharge status: Home / Self Care | Payer: Self-pay | Source: Home / Self Care | Attending: Orthopedic Surgery

## 2018-02-07 ENCOUNTER — Ambulatory Visit (HOSPITAL_COMMUNITY)
Admission: RE | Admit: 2018-02-07 | Discharge: 2018-02-07 | Disposition: A | Discharge status: Home / Self Care | Payer: 59 | Attending: Orthopedic Surgery | Admitting: Orthopedic Surgery

## 2018-02-07 ENCOUNTER — Encounter (HOSPITAL_COMMUNITY): Payer: Self-pay | Admitting: Anesthesiology

## 2018-02-07 DIAGNOSIS — M069 Rheumatoid arthritis, unspecified: Secondary | ICD-10-CM | POA: Insufficient documentation

## 2018-02-07 DIAGNOSIS — M659 Synovitis and tenosynovitis, unspecified: Secondary | ICD-10-CM | POA: Insufficient documentation

## 2018-02-07 DIAGNOSIS — M94261 Chondromalacia, right knee: Secondary | ICD-10-CM | POA: Insufficient documentation

## 2018-02-07 DIAGNOSIS — M1711 Unilateral primary osteoarthritis, right knee: Secondary | ICD-10-CM | POA: Diagnosis not present

## 2018-02-07 DIAGNOSIS — E039 Hypothyroidism, unspecified: Secondary | ICD-10-CM | POA: Insufficient documentation

## 2018-02-07 DIAGNOSIS — Z791 Long term (current) use of non-steroidal anti-inflammatories (NSAID): Secondary | ICD-10-CM | POA: Insufficient documentation

## 2018-02-07 DIAGNOSIS — Z79890 Hormone replacement therapy (postmenopausal): Secondary | ICD-10-CM | POA: Insufficient documentation

## 2018-02-07 DIAGNOSIS — X58XXXA Exposure to other specified factors, initial encounter: Secondary | ICD-10-CM | POA: Insufficient documentation

## 2018-02-07 DIAGNOSIS — S83281A Other tear of lateral meniscus, current injury, right knee, initial encounter: Secondary | ICD-10-CM | POA: Insufficient documentation

## 2018-02-07 DIAGNOSIS — M23300 Other meniscus derangements, unspecified lateral meniscus, right knee: Secondary | ICD-10-CM | POA: Diagnosis not present

## 2018-02-07 DIAGNOSIS — M65961 Unspecified synovitis and tenosynovitis, right lower leg: Secondary | ICD-10-CM

## 2018-02-07 DIAGNOSIS — I10 Essential (primary) hypertension: Secondary | ICD-10-CM | POA: Insufficient documentation

## 2018-02-07 DIAGNOSIS — Z79899 Other long term (current) drug therapy: Secondary | ICD-10-CM | POA: Diagnosis not present

## 2018-02-07 DIAGNOSIS — Z9889 Other specified postprocedural states: Secondary | ICD-10-CM | POA: Insufficient documentation

## 2018-02-07 HISTORY — PX: KNEE ARTHROSCOPY WITH MEDIAL MENISECTOMY: SHX5651

## 2018-02-07 SURGERY — ARTHROSCOPY, KNEE, WITH MEDIAL MENISCECTOMY
Anesthesia: General | Site: Knee | Laterality: Right

## 2018-02-07 MED ORDER — HYDROMORPHONE HCL 1 MG/ML IJ SOLN
0.2500 mg | INTRAMUSCULAR | Status: DC | PRN
  Administered 2018-02-07: 0.5 mg via INTRAVENOUS

## 2018-02-07 MED ORDER — FENTANYL CITRATE (PF) 100 MCG/2ML IJ SOLN
INTRAMUSCULAR | Status: DC | PRN
  Administered 2018-02-07: 25 ug via INTRAVENOUS
  Administered 2018-02-07: 50 ug via INTRAVENOUS
  Administered 2018-02-07: 25 ug via INTRAVENOUS

## 2018-02-07 MED ORDER — HYDROCODONE-ACETAMINOPHEN 7.5-325 MG PO TABS
1.0000 | ORAL_TABLET | Freq: Once | ORAL | Status: AC
  Administered 2018-02-07: 1 via ORAL
  Filled 2018-02-07: qty 1

## 2018-02-07 MED ORDER — KETOROLAC TROMETHAMINE 30 MG/ML IJ SOLN
30.0000 mg | Freq: Once | INTRAMUSCULAR | Status: AC
  Administered 2018-02-07: 30 mg via INTRAVENOUS

## 2018-02-07 MED ORDER — PROPOFOL 10 MG/ML IV BOLUS
INTRAVENOUS | Status: AC
  Filled 2018-02-07: qty 20

## 2018-02-07 MED ORDER — ONDANSETRON HCL 4 MG/2ML IJ SOLN: 4.0000 mg | Freq: Once | INTRAMUSCULAR | Status: DC | PRN

## 2018-02-07 MED ORDER — HYDROMORPHONE HCL 1 MG/ML IJ SOLN
INTRAMUSCULAR | Status: AC
  Filled 2018-02-07: qty 0.5

## 2018-02-07 MED ORDER — CHLORHEXIDINE GLUCONATE 4 % EX LIQD: 60.0000 mL | Freq: Once | CUTANEOUS | Status: DC

## 2018-02-07 MED ORDER — ONDANSETRON HCL 4 MG/2ML IJ SOLN
INTRAMUSCULAR | Status: DC | PRN
  Administered 2018-02-07: 4 mg via INTRAVENOUS

## 2018-02-07 MED ORDER — SCOPOLAMINE 1 MG/3DAYS TD PT72
1.0000 | MEDICATED_PATCH | TRANSDERMAL | Status: DC
  Administered 2018-02-07: 1.5 mg via TRANSDERMAL

## 2018-02-07 MED ORDER — HYDROCODONE-ACETAMINOPHEN 7.5-325 MG PO TABS: 1.0000 | ORAL_TABLET | Freq: Once | ORAL | Status: DC | PRN

## 2018-02-07 MED ORDER — GLYCOPYRROLATE 0.2 MG/ML IJ SOLN
INTRAMUSCULAR | Status: DC | PRN
  Administered 2018-02-07: 0.2 mg via INTRAVENOUS

## 2018-02-07 MED ORDER — SCOPOLAMINE 1 MG/3DAYS TD PT72
MEDICATED_PATCH | TRANSDERMAL | Status: AC
  Filled 2018-02-07: qty 1

## 2018-02-07 MED ORDER — CEFAZOLIN SODIUM-DEXTROSE 2-4 GM/100ML-% IV SOLN
2.0000 g | INTRAVENOUS | Status: AC
  Administered 2018-02-07: 2 g via INTRAVENOUS

## 2018-02-07 MED ORDER — GLYCOPYRROLATE PF 0.2 MG/ML IJ SOSY
PREFILLED_SYRINGE | INTRAMUSCULAR | Status: AC
  Filled 2018-02-07: qty 1

## 2018-02-07 MED ORDER — ONDANSETRON HCL 4 MG/2ML IJ SOLN
4.0000 mg | Freq: Once | INTRAMUSCULAR | Status: AC
  Administered 2018-02-07: 4 mg via INTRAVENOUS

## 2018-02-07 MED ORDER — BUPIVACAINE-EPINEPHRINE (PF) 0.25% -1:200000 IJ SOLN
INTRAMUSCULAR | Status: AC
  Filled 2018-02-07: qty 60

## 2018-02-07 MED ORDER — DEXAMETHASONE SODIUM PHOSPHATE 10 MG/ML IJ SOLN
INTRAMUSCULAR | Status: DC | PRN
  Administered 2018-02-07: 10 mg via INTRAVENOUS

## 2018-02-07 MED ORDER — MIDAZOLAM HCL 5 MG/5ML IJ SOLN
INTRAMUSCULAR | Status: DC | PRN
  Administered 2018-02-07: 2 mg via INTRAVENOUS

## 2018-02-07 MED ORDER — MEPERIDINE HCL 50 MG/ML IJ SOLN: 6.2500 mg | INTRAMUSCULAR | Status: DC | PRN

## 2018-02-07 MED ORDER — PROPOFOL 10 MG/ML IV BOLUS
INTRAVENOUS | Status: DC | PRN
  Administered 2018-02-07: 20 mg via INTRAVENOUS
  Administered 2018-02-07: 180 mg via INTRAVENOUS

## 2018-02-07 MED ORDER — KETOROLAC TROMETHAMINE 30 MG/ML IJ SOLN: 30.0000 mg | Freq: Once | INTRAMUSCULAR | Status: DC | PRN

## 2018-02-07 MED ORDER — CEFAZOLIN SODIUM-DEXTROSE 2-4 GM/100ML-% IV SOLN
INTRAVENOUS | Status: AC
  Filled 2018-02-07: qty 100

## 2018-02-07 MED ORDER — DEXAMETHASONE SODIUM PHOSPHATE 10 MG/ML IJ SOLN
INTRAMUSCULAR | Status: AC
  Filled 2018-02-07: qty 1

## 2018-02-07 MED ORDER — LIDOCAINE 2% (20 MG/ML) 5 ML SYRINGE
INTRAMUSCULAR | Status: AC
  Filled 2018-02-07: qty 5

## 2018-02-07 MED ORDER — KETOROLAC TROMETHAMINE 30 MG/ML IJ SOLN
INTRAMUSCULAR | Status: AC
  Filled 2018-02-07: qty 1

## 2018-02-07 MED ORDER — FENTANYL CITRATE (PF) 100 MCG/2ML IJ SOLN
INTRAMUSCULAR | Status: AC
  Filled 2018-02-07: qty 2

## 2018-02-07 MED ORDER — EPHEDRINE SULFATE 50 MG/ML IJ SOLN
INTRAMUSCULAR | Status: DC | PRN
  Administered 2018-02-07: 5 mg via INTRAVENOUS

## 2018-02-07 MED ORDER — ONDANSETRON HCL 4 MG/2ML IJ SOLN
INTRAMUSCULAR | Status: AC
  Filled 2018-02-07: qty 2

## 2018-02-07 MED ORDER — LACTATED RINGERS IV SOLN
INTRAVENOUS | Status: DC
  Administered 2018-02-07: 09:00:00 via INTRAVENOUS

## 2018-02-07 MED ORDER — OXYCODONE-ACETAMINOPHEN 5-325 MG PO TABS: 1.0000 | ORAL_TABLET | ORAL | 0 refills | Status: AC | PRN

## 2018-02-07 MED ORDER — BUPIVACAINE-EPINEPHRINE (PF) 0.25% -1:200000 IJ SOLN
INTRAMUSCULAR | Status: DC | PRN
  Administered 2018-02-07: 60 mL via PERINEURAL

## 2018-02-07 MED ORDER — MIDAZOLAM HCL 2 MG/2ML IJ SOLN
INTRAMUSCULAR | Status: AC
  Filled 2018-02-07: qty 2

## 2018-02-07 MED ORDER — SODIUM CHLORIDE 0.9 % IR SOLN
Status: DC | PRN
  Administered 2018-02-07 (×5): 3000 mL

## 2018-02-07 MED ORDER — LIDOCAINE 2% (20 MG/ML) 5 ML SYRINGE
INTRAMUSCULAR | Status: DC | PRN
  Administered 2018-02-07: 100 mg via INTRAVENOUS

## 2018-02-07 MED FILL — ACTEMRA 162 MG/0.9 ML SYRN: 162 | 28 days supply | Qty: 2 | Fill #1

## 2018-02-07 SURGICAL SUPPLY — 45 items
ARTHROWAND PARAGON T2 (SURGICAL WAND)
BANDAGE ELASTIC 6 LF NS (GAUZE/BANDAGES/DRESSINGS) ×3 IMPLANT
BLADE AGGRESSIVE PLUS 4.0 (BLADE) ×3 IMPLANT
BLADE SURG SZ11 CARB STEEL (BLADE) ×3 IMPLANT
CHLORAPREP W/TINT 26ML (MISCELLANEOUS) ×3 IMPLANT
CLOTH BEACON ORANGE TIMEOUT ST (SAFETY) ×3 IMPLANT
COOLER CRYO IC GRAV AND TUBE (ORTHOPEDIC SUPPLIES) ×3 IMPLANT
COVER WAND RF STERILE (DRAPES) ×3 IMPLANT
CUFF CRYO KNEE18X23 MED (MISCELLANEOUS) ×3 IMPLANT
CUFF TOURNIQUET SINGLE 34IN LL (TOURNIQUET CUFF) ×3 IMPLANT
CUTTER ANGLED DBL BITE 4.5 (BURR) IMPLANT
GAUZE 4X4 16PLY RFD (DISPOSABLE) ×3 IMPLANT
GAUZE SPONGE 4X4 12PLY STRL (GAUZE/BANDAGES/DRESSINGS) ×3 IMPLANT
GAUZE SPONGE 4X4 16PLY XRAY LF (GAUZE/BANDAGES/DRESSINGS) ×3 IMPLANT
GAUZE XEROFORM 5X9 LF (GAUZE/BANDAGES/DRESSINGS) ×3 IMPLANT
GLOVE BIOGEL PI IND STRL 7.0 (GLOVE) ×2 IMPLANT
GLOVE BIOGEL PI INDICATOR 7.0 (GLOVE) ×4
GLOVE SKINSENSE NS SZ8.0 LF (GLOVE) ×2
GLOVE SKINSENSE STRL SZ8.0 LF (GLOVE) ×1 IMPLANT
GLOVE SS N UNI LF 8.5 STRL (GLOVE) ×3 IMPLANT
GOWN STRL REUS W/ TWL LRG LVL3 (GOWN DISPOSABLE) ×1 IMPLANT
GOWN STRL REUS W/TWL LRG LVL3 (GOWN DISPOSABLE) ×2
GOWN STRL REUS W/TWL XL LVL3 (GOWN DISPOSABLE) ×3 IMPLANT
IV NS IRRIG 3000ML ARTHROMATIC (IV SOLUTION) ×15 IMPLANT
KIT BLADEGUARD II DBL (SET/KITS/TRAYS/PACK) ×3 IMPLANT
KIT TURNOVER CYSTO (KITS) ×3 IMPLANT
MANIFOLD NEPTUNE II (INSTRUMENTS) ×3 IMPLANT
MARKER SKIN DUAL TIP RULER LAB (MISCELLANEOUS) ×3 IMPLANT
NEEDLE HYPO 18GX1.5 BLUNT FILL (NEEDLE) ×3 IMPLANT
NEEDLE HYPO 21X1.5 SAFETY (NEEDLE) ×3 IMPLANT
NEEDLE SPNL 18GX3.5 QUINCKE PK (NEEDLE) ×3 IMPLANT
PACK ARTHRO LIMB DRAPE STRL (MISCELLANEOUS) ×3 IMPLANT
PAD ABD 5X9 TENDERSORB (GAUZE/BANDAGES/DRESSINGS) ×3 IMPLANT
PAD ARMBOARD 7.5X6 YLW CONV (MISCELLANEOUS) ×3 IMPLANT
PADDING CAST COTTON 6X4 STRL (CAST SUPPLIES) ×3 IMPLANT
PROBE BIPOLAR 50 DEGREE SUCT (MISCELLANEOUS) ×3 IMPLANT
SET ARTHROSCOPY INST (INSTRUMENTS) ×3 IMPLANT
SET BASIN LINEN APH (SET/KITS/TRAYS/PACK) ×3 IMPLANT
SUT ETHILON 3 0 FSL (SUTURE) ×3 IMPLANT
SYR 10ML LL (SYRINGE) ×3 IMPLANT
SYR 30ML LL (SYRINGE) ×6 IMPLANT
TUBE CONNECTING 12'X1/4 (SUCTIONS) ×2
TUBE CONNECTING 12X1/4 (SUCTIONS) ×4 IMPLANT
TUBING ARTHRO INFLOW-ONLY STRL (TUBING) ×3 IMPLANT
WAND ARTHRO PARAGON T2 (SURGICAL WAND) IMPLANT

## 2018-02-07 NOTE — Anesthesia Postprocedure Evaluation (Signed)
Anesthesia Post Note  Patient: Jessica Pacheco  Procedure(s) Performed: KNEE ARTHROSCOPY WITH PARTIAL LATERAL MENISECTOMY (Right Knee)  Anesthesia Type: General Level of consciousness: awake and alert and patient cooperative Pain management: pain level controlled Vital Signs Assessment: post-procedure vital signs reviewed and stable Respiratory status: spontaneous breathing, nonlabored ventilation and respiratory function stable Cardiovascular status: blood pressure returned to baseline Postop Assessment: no apparent nausea or vomiting and adequate PO intake Anesthetic complications: no     Last Vitals:  Vitals:   02/07/18 1045 02/07/18 1100  BP: 114/85 118/75  Pulse: (!) 104   Resp: 11   Temp:    SpO2: 94%     Last Pain:  Vitals:   02/07/18 1029  TempSrc:   PainSc: 7                  [place] Doig J

## 2018-02-07 NOTE — Discharge Instructions (Signed)
We did arthroscopic surgery on the right knee We found: Synovitis or inflammation in various portions of the knee mild to moderate this contributed to the appearance of a popliteal tendon and ACL tear and diminutive status respectively  However the ACL and popliteus tendon were intact  You had small chondral lesions on the patella as well as the medial femoral condyle and lateral femoral condyle  The previous meniscectomy which was done was intact  The lateral meniscus was torn in the posterior horn, body, and anterior horn and the tears were resected     Knee Arthroscopy, Care After This sheet gives you information about how to care for yourself after your procedure. Your health care provider may also give you more specific instructions. If you have problems or questions, contact your health care provider. What can I expect after the procedure? After the procedure, it is common to have:  Soreness.  Swelling.  Pain that can be relieved by taking pain medicine. Follow these instructions at home: Incision care   Follow instructions from your health care provider about how to take care of your incisions. Make sure you: ? Wash your hands with soap and water before you change your bandage (dressing). If soap and water are not available, use hand sanitizer. ? Change your dressing as told by your health care provider. ? Leave stitches (sutures), staples, skin glue, or adhesive strips in place. These skin closures may need to stay in place for 2 weeks or longer. If adhesive strip edges start to loosen and curl up, you may trim the loose edges. Do not remove adhesive strips completely unless your health care provider tells you to do that.  Check your incision areas every day for signs of infection. Check for: ? Redness. ? More swelling or pain. ? Fluid or blood. ? Warmth. ? Pus or a bad smell. Bathing  Do not take baths, swim, or use a hot tub until your health care provider  approves. Ask your health care provider if you may take showers. You may only be allowed to take sponge baths. Activity  Do not use your knee to support your body weight until your health care provider says that you can. Follow weight-bearing restrictions as told. Use crutches or other devices to help you move around (assistive devices) as directed.  Ask your health care provider what activities are safe for you during recovery, and what activities you need to avoid.  If physical therapy was prescribed, do exercises as directed. Doing exercises may help improve knee movement and flexibility (range of motion).  Do not lift anything that is heavier than 10 lb (4.5 kg), or the limit that you are told, until your health care provider says that it is safe. Driving  Do not drive until your health care provider approves. You may be able to drive after 1-3 weeks.  Do not drive or use heavy machinery while taking prescription pain medicine. Managing pain, stiffness, and swelling   If directed, put ice on the injured area: ? Put ice in a plastic bag or use the icing device (cold therapy unit) that you were given. Follow instructions from your health care provider about how to use the icing device. ? Place a towel between your skin and the bag or between your skin and the icing device. ? Leave the ice on for 20 minutes, 2-3 times a day.  Move your toes often to avoid stiffness and to lessen swelling.  Raise (elevate) the injured area  above the level of your heart while you are sitting or lying down. If you are taking blood thinners:  Before you take any medicines that contain aspirin or NSAIDs, talk with your health care provider. These medicines increase your risk for dangerous bleeding.  Take your medicine exactly as told, at the same time every day.  Avoid activities that could cause injury or bruising, and follow instructions about how to prevent falls.  Wear a medical alert bracelet or  carry a card that lists what medicines you take. General instructions  Take over-the-counter and prescription medicines only as told by your health care provider.  If you are taking prescription pain medicine, take actions to prevent or treat constipation. Your health care provider may recommend that you: ? Drink enough fluid to keep your urine pale yellow. ? Eat foods that are high in fiber, such as fresh fruits and vegetables, whole grains, and beans. ? Limit foods that are high in fat and processed sugars, such as fried or sweet foods. ? Take an over-the-counter or prescription medicines for constipation.  Do not use any products that contain nicotine or tobacco, such as cigarettes and e-cigarettes. These can delay incision or bone healing. If you need help quitting, ask your health care provider.  Wear compression stockings as told by your health care provider. These stockings help to prevent blood clots and reduce swelling in your legs.  Keep all follow-up visits as told by your health care provider. This is important. Contact a health care provider if you:  Have a fever.  Have severe pain.  Have redness around an incision.  Have more swelling.  Have fluid or blood coming from an incision.  Notice that an incision feels warm to the touch.  Notice pus or a bad smell coming from an incision.  Notice that an incision opens up.  Develop a rash. Get help right away if you:  Have difficulty breathing.  Have shortness of breath.  Have chest pain.  Develop pain in your lower leg or at the back of your knee.  Have numbness or tingling in your lower leg or your foot. Summary  Raise (elevate) the injured area above the level of your heart while you are sitting or lying down.  To help relieve pain and swelling, put ice on your leg for 20 minutes at a time, 2-3 times a day.  If you were prescribed a blood thinner, avoid activities that could cause injury or bruising, and  follow instructions about how to prevent falls.  If physical therapy was prescribed, do exercises as directed. Doing exercises may help improve range of motion. This information is not intended to replace advice given to you by your health care provider. Make sure you discuss any questions you have with your health care provider. Document Released: 08/26/2004 Document Revised: 12/20/2016 Document Reviewed: 12/20/2016 Elsevier Interactive Patient Education  2019 [employer]    General Anesthesia, Adult, Care After This sheet gives you information about how to care for yourself after your procedure. Your health care provider may also give you more specific instructions. If you have problems or questions, contact your health care provider. What can I expect after the procedure? After the procedure, the following side effects are common:  Pain or discomfort at the IV site.  Nausea.  Vomiting.  Sore throat.  Trouble concentrating.  Feeling cold or chills.  Weak or tired.  Sleepiness and fatigue.  Soreness and body aches. These side effects can affect parts  of the body that were not involved in surgery. Follow these instructions at home:  For at least 24 hours after the procedure:  Have a responsible adult stay with you. It is important to have someone help care for you until you are awake and alert.  Rest as needed.  Do not: ? Participate in activities in which you could fall or become injured. ? Drive. ? Use heavy machinery. ? Drink alcohol. ? Take sleeping pills or medicines that cause drowsiness. ? Make important decisions or sign legal documents. ? Take care of children on your own. Eating and drinking  Follow any instructions from your health care provider about eating or drinking restrictions.  When you feel hungry, start by eating small amounts of foods that are soft and easy to digest (bland), such as toast. Gradually return to your regular diet.  Drink  enough fluid to keep your urine pale yellow.  If you vomit, rehydrate by drinking water, juice, or clear broth. General instructions  If you have sleep apnea, surgery and certain medicines can increase your risk for breathing problems. Follow instructions from your health care provider about wearing your sleep device: ? Anytime you are sleeping, including during daytime naps. ? While taking prescription pain medicines, sleeping medicines, or medicines that make you drowsy.  Return to your normal activities as told by your health care provider. Ask your health care provider what activities are safe for you.  Take over-the-counter and prescription medicines only as told by your health care provider.  If you smoke, do not smoke without supervision.  Keep all follow-up visits as told by your health care provider. This is important. Contact a health care provider if:  You have nausea or vomiting that does not get better with medicine.  You cannot eat or drink without vomiting.  You have pain that does not get better with medicine.  You are unable to pass urine.  You develop a skin rash.  You have a fever.  You have redness around your IV site that gets worse. Get help right away if:  You have difficulty breathing.  You have chest pain.  You have blood in your urine or stool, or you vomit blood. Summary  After the procedure, it is common to have a sore throat or nausea. It is also common to feel tired.  Have a responsible adult stay with you for the first 24 hours after general anesthesia. It is important to have someone help care for you until you are awake and alert.  When you feel hungry, start by eating small amounts of foods that are soft and easy to digest (bland), such as toast. Gradually return to your regular diet.  Drink enough fluid to keep your urine pale yellow.  Return to your normal activities as told by your health care provider. Ask your health care provider  what activities are safe for you. This information is not intended to replace advice given to you by your health care provider. Make sure you discuss any questions you have with your health care provider. Document Released: 05/15/2000 Document Revised: 09/22/2016 Document Reviewed: 09/22/2016 Elsevier Interactive Patient Education  2019 [employer].

## 2018-02-07 NOTE — Op Note (Signed)
02/07/2018  10:32 AM  PATIENT:  Jessica Pacheco  42 y.o. female  PRE-OPERATIVE DIAGNOSIS:  torn medial menscus and lateral meniscus of the right knee  POST-OPERATIVE DIAGNOSIS:  LATERAL MENISCUS TEAR; ARTHRITIS; SYNOVITIS  PROCEDURE:  Procedure(s): KNEE ARTHROSCOPY WITH PARTIAL LATERAL MENISECTOMY (Right) 29881  FINDINGS:  Medial compartment small 5 x 3 superficial cartilage lesion previously noted resection intact remaining cartilaginous surfaces otherwise normal  Lateral compartment intact popliteus tendon, chondromalacia of the lateral femoral condyle and plateau mild with posterior horn body and anterior horn tear lateral meniscus  ACL intact PCL intact  Patellofemoral joint mild chondromalacia  Mild to moderate synovitis were seen in various portions of the joint specifically around the ACL popliteus tendon and some in the suprapatellar pouch.  This contributed to appearance of ACL abnormality on MRI and popliteus tendon abnormality on MRI  Knee arthroscopy dictation  The patient was identified in the preoperative holding area using 2 approved identification mechanisms. The chart was reviewed and updated. The surgical site was confirmed as RIGHT  knee and marked with an indelible marker.  The patient was taken to the operating room for anesthesia. After successful  GENERAQL anesthesia, ANCEF was used as IV antibiotics.  The patient was placed in the supine position with the (RIGHT ) the operative extremity in an arthroscopic leg holder and the opposite extremity in a padded leg holder.  The timeout was executed.  A lateral portal was established with an 11 blade and the scope was introduced into the joint. A diagnostic arthroscopy was performed in circumferential manner examining the entire knee joint. A medial portal was established and the diagnostic arthroscopy was repeated using a probe to palpate intra-articular structures as they were encountered.   The medial meniscus  was found to be small with a complete rim with no tear of the posterior horn.  I debrided the free edge but there was no tear superiorly or inferiorly  We turned our attention to the lateral meniscus and used a shaver to debride the free edges and tearing of the lateral meniscus in the posterior horn body and anterior horn we also used a 50 degree Linvatec wand to contour the meniscus until a stable rim.  I paid particular attention to the popliteus tendon which was thought to be torn on MRI but was obviously intact  Synovectomy was done primarily for visualization mainly in the lateral compartment we did some in the notch area and in other areas as needed.  The arthroscopic pump was placed on the wash mode and any excess debris was removed from the joint using suction.  60 cc of Marcaine with epinephrine was injected through the arthroscope.  The portals were closed with 3-0 nylon suture.  A sterile bandage, Ace wrap and Cryo/Cuff was placed and the Cryo/Cuff was activated. The patient was taken to the recovery room in stable condition.  SURGEON:  Surgeon(s) and Role:    * Vickki Hearing, MD - Primary  PHYSICIAN ASSISTANT:   ASSISTANTS: none   ANESTHESIA:   general  EBL:  0 mL   BLOOD ADMINISTERED:none  DRAINS: none   LOCAL MEDICATIONS USED:  MARCAINE     SPECIMEN:  No Specimen  DISPOSITION OF SPECIMEN:  N/A  COUNTS:  YES  TOURNIQUET:  * Missing tourniquet times found for documented tourniquets in log: [id number] *  DICTATION: .Dragon Dictation  PLAN OF CARE: Discharge to home after PACU  PATIENT DISPOSITION:  PACU - hemodynamically stable.   Delay start  of Pharmacological VTE agent (>24hrs) due to surgical blood loss or risk of bleeding: not applicable

## 2018-02-07 NOTE — Brief Op Note (Signed)
02/07/2018  10:26 AM  PATIENT:  Jessica Pacheco  42 y.o. female  PRE-OPERATIVE DIAGNOSIS:  torn medial menscus and lateral meniscus of the right knee  POST-OPERATIVE DIAGNOSIS:  LATERAL MENISCUS TEAR; ARTHRITIS; SYNOVITIS  PROCEDURE:  Procedure(s): KNEE ARTHROSCOPY WITH PARTIAL LATERAL MENISECTOMY (Right) 29881  FINDINGS:  Medial compartment small 5 x 3 superficial cartilage lesion previously noted resection intact remaining cartilaginous surfaces otherwise normal  Lateral compartment intact popliteus tendon, chondromalacia of the lateral femoral condyle and plateau mild with posterior horn body and anterior horn tear lateral meniscus  ACL intact PCL intact  Patellofemoral joint mild chondromalacia  Mild to moderate synovitis were seen in various portions of the joint specifically around the ACL popliteus tendon and some in the suprapatellar pouch.  This contributed to appearance of ACL abnormality on MRI and popliteus tendon abnormality on MRI  Knee arthroscopy dictation  The patient was identified in the preoperative holding area using 2 approved identification mechanisms. The chart was reviewed and updated. The surgical site was confirmed as RIGHT  knee and marked with an indelible marker.  The patient was taken to the operating room for anesthesia. After successful  GENERAQL anesthesia, ANCEF was used as IV antibiotics.  The patient was placed in the supine position with the (RIGHT ) the operative extremity in an arthroscopic leg holder and the opposite extremity in a padded leg holder.  The timeout was executed.  A lateral portal was established with an 11 blade and the scope was introduced into the joint. A diagnostic arthroscopy was performed in circumferential manner examining the entire knee joint. A medial portal was established and the diagnostic arthroscopy was repeated using a probe to palpate intra-articular structures as they were encountered.   The medial meniscus  was found to be small with a complete rim with no tear of the posterior horn.  I debrided the free edge but there was no tear superiorly or inferiorly  We turned our attention to the lateral meniscus and used a shaver to debride the free edges and tearing of the lateral meniscus in the posterior horn body and anterior horn we also used a 50 degree Linvatec wand to contour the meniscus until a stable rim.  I paid particular attention to the popliteus tendon which was thought to be torn on MRI but was obviously intact  Synovectomy was done primarily for visualization mainly in the lateral compartment we did some in the notch area and in other areas as needed.  The arthroscopic pump was placed on the wash mode and any excess debris was removed from the joint using suction.  60 cc of Marcaine with epinephrine was injected through the arthroscope.  The portals were closed with 3-0 nylon suture.  A sterile bandage, Ace wrap and Cryo/Cuff was placed and the Cryo/Cuff was activated. The patient was taken to the recovery room in stable condition.  SURGEON:  Surgeon(s) and Role:    * Vickki Hearing, MD - Primary  PHYSICIAN ASSISTANT:   ASSISTANTS: none   ANESTHESIA:   general  EBL:  0 mL   BLOOD ADMINISTERED:none  DRAINS: none   LOCAL MEDICATIONS USED:  MARCAINE     SPECIMEN:  No Specimen  DISPOSITION OF SPECIMEN:  N/A  COUNTS:  YES  TOURNIQUET:  * Missing tourniquet times found for documented tourniquets in log: [id number] *  DICTATION: .Dragon Dictation  PLAN OF CARE: Discharge to home after PACU  PATIENT DISPOSITION:  PACU - hemodynamically stable.   Delay start  of Pharmacological VTE agent (>24hrs) due to surgical blood loss or risk of bleeding: not applicable

## 2018-02-07 NOTE — Brief Op Note (Signed)
02/07/2018  10:32 AM  PATIENT:  Jessica Pacheco  42 y.o. female  PRE-OPERATIVE DIAGNOSIS:  torn medial menscus and lateral meniscus of the right knee  POST-OPERATIVE DIAGNOSIS:  LATERAL MENISCUS TEAR; ARTHRITIS; SYNOVITIS  PROCEDURE:  Procedure(s): KNEE ARTHROSCOPY WITH PARTIAL LATERAL MENISECTOMY (Right) 29881  FINDINGS:  Medial compartment small 5 x 3 superficial cartilage lesion previously noted resection intact remaining cartilaginous surfaces otherwise normal  Lateral compartment intact popliteus tendon, chondromalacia of the lateral femoral condyle and plateau mild with posterior horn body and anterior horn tear lateral meniscus  ACL intact PCL intact  Patellofemoral joint mild chondromalacia  Mild to moderate synovitis were seen in various portions of the joint specifically around the ACL popliteus tendon and some in the suprapatellar pouch.  This contributed to appearance of ACL abnormality on MRI and popliteus tendon abnormality on MRI  Knee arthroscopy dictation  The patient was identified in the preoperative holding area using 2 approved identification mechanisms. The chart was reviewed and updated. The surgical site was confirmed as RIGHT  knee and marked with an indelible marker.  The patient was taken to the operating room for anesthesia. After successful  GENERAQL anesthesia, ANCEF was used as IV antibiotics.  The patient was placed in the supine position with the (RIGHT ) the operative extremity in an arthroscopic leg holder and the opposite extremity in a padded leg holder.  The timeout was executed.  A lateral portal was established with an 11 blade and the scope was introduced into the joint. A diagnostic arthroscopy was performed in circumferential manner examining the entire knee joint. A medial portal was established and the diagnostic arthroscopy was repeated using a probe to palpate intra-articular structures as they were encountered.   The medial meniscus  was found to be small with a complete rim with no tear of the posterior horn.  I debrided the free edge but there was no tear superiorly or inferiorly  We turned our attention to the lateral meniscus and used a shaver to debride the free edges and tearing of the lateral meniscus in the posterior horn body and anterior horn we also used a 50 degree Linvatec wand to contour the meniscus until a stable rim.  I paid particular attention to the popliteus tendon which was thought to be torn on MRI but was obviously intact  Synovectomy was done primarily for visualization mainly in the lateral compartment we did some in the notch area and in other areas as needed.  The arthroscopic pump was placed on the wash mode and any excess debris was removed from the joint using suction.  60 cc of Marcaine with epinephrine was injected through the arthroscope.  The portals were closed with 3-0 nylon suture.  A sterile bandage, Ace wrap and Cryo/Cuff was placed and the Cryo/Cuff was activated. The patient was taken to the recovery room in stable condition.  SURGEON:  Surgeon(s) and Role:    * Marlane Hirschmann E, MD - Primary  PHYSICIAN ASSISTANT:   ASSISTANTS: none   ANESTHESIA:   general  EBL:  0 mL   BLOOD ADMINISTERED:none  DRAINS: none   LOCAL MEDICATIONS USED:  MARCAINE     SPECIMEN:  No Specimen  DISPOSITION OF SPECIMEN:  N/A  COUNTS:  YES  TOURNIQUET:  * Missing tourniquet times found for documented tourniquets in log: [id number] *  DICTATION: .Dragon Dictation  PLAN OF CARE: Discharge to home after PACU  PATIENT DISPOSITION:  PACU - hemodynamically stable.   Delay start   of Pharmacological VTE agent (>24hrs) due to surgical blood loss or risk of bleeding: not applicable

## 2018-02-07 NOTE — Anesthesia Procedure Notes (Signed)
Procedure Name: LMA Insertion Date/Time: 02/07/2018 9:29 AM Performed by: Franco Nones, CRNA Pre-anesthesia Checklist: Patient identified, Patient being monitored, Emergency Drugs available, Timeout performed and Suction available Patient Re-evaluated:Patient Re-evaluated prior to induction Oxygen Delivery Method: Circle System Utilized Preoxygenation: Pre-oxygenation with 100% oxygen Induction Type: IV induction Ventilation: Mask ventilation without difficulty LMA: LMA inserted LMA Size: 4.0 Number of attempts: 1 Placement Confirmation: positive ETCO2 and breath sounds checked- equal and bilateral Tube secured with: Tape Dental Injury: Teeth and Oropharynx as per pre-operative assessment

## 2018-02-07 NOTE — Transfer of Care (Signed)
Immediate Anesthesia Transfer of Care Note  Patient: Jessica Pacheco  Procedure(s) Performed: KNEE ARTHROSCOPY WITH PARTIAL LATERAL MENISECTOMY (Right Knee)  Patient Location: PACU  Anesthesia Type:General  Level of Consciousness: awake, alert  and patient cooperative  Airway & Oxygen Therapy: Patient Spontanous Breathing  Post-op Assessment: Report given to RN and Post -op Vital signs reviewed and stable  Post vital signs: Reviewed and stable  Last Vitals:  Vitals Value Taken Time  BP 155/103 02/07/2018 10:26 AM  Temp    Pulse 117 02/07/2018 10:30 AM  Resp 20 02/07/2018 10:30 AM  SpO2 97 % 02/07/2018 10:30 AM  Vitals shown include unvalidated device data.  Last Pain:  Vitals:   02/07/18 0859  TempSrc: Oral  PainSc:       Patients Stated Pain Goal: 9 (02/07/18 0805)  Complications: No apparent anesthesia complications

## 2018-02-07 NOTE — Anesthesia Preprocedure Evaluation (Signed)
Anesthesia Evaluation  Patient identified by MRN, date of birth, ID band Patient awake    Reviewed: Allergy & Precautions, H&P , NPO status , Patient's Chart, lab work & pertinent test results  History of Anesthesia Complications (+) PONV and history of anesthetic complications  Airway Mallampati: II  TM Distance: >3 FB Neck ROM: full    Dental no notable dental hx.    Pulmonary neg pulmonary ROS,    Pulmonary exam normal breath sounds clear to auscultation       Cardiovascular Exercise Tolerance: Good hypertension, negative cardio ROS   Rhythm:regular Rate:Normal     Neuro/Psych negative neurological ROS  negative psych ROS   GI/Hepatic Neg liver ROS, GERD  ,  Endo/Other  Hypothyroidism   Renal/GU negative Renal ROS  negative genitourinary   Musculoskeletal   Abdominal   Peds  Hematology negative hematology ROS (+)   Anesthesia Other Findings   Reproductive/Obstetrics negative OB ROS                             Anesthesia Physical Anesthesia Plan  ASA: II  Anesthesia Plan: General   Post-op Pain Management:    Induction:   PONV Risk Score and Plan:   Airway Management Planned:   Additional Equipment:   Intra-op Plan:   Post-operative Plan:   Informed Consent: I have reviewed the patients History and Physical, chart, labs and discussed the procedure including the risks, benefits and alternatives for the proposed anesthesia with the patient or authorized representative who has indicated his/her understanding and acceptance.   Dental Advisory Given  Plan Discussed with: CRNA  Anesthesia Plan Comments:         Anesthesia Quick Evaluation

## 2018-02-07 NOTE — Interval H&P Note (Signed)
History and Physical Interval Note:  02/07/2018 9:07 AM  Jessica Pacheco  has presented today for surgery, with the diagnosis of torn medial menscus and lateral meniscus of the right knee  The various methods of treatment have been discussed with the patient and family. After consideration of risks, benefits and other options for treatment, the patient has consented to  Procedure(s): KNEE ARTHROSCOPY WITH MEDIAL MENISCECTOMY AND LATERAL MENISCECTOMY (Right) as a surgical intervention .  The patient's history has been reviewed, patient examined, no change in status, stable for surgery.  I have reviewed the patient's chart and labs.  Questions were answered to the patient's satisfaction.     Fuller Canada

## 2018-02-08 ENCOUNTER — Encounter (HOSPITAL_COMMUNITY): Payer: Self-pay | Admitting: Orthopedic Surgery

## 2018-02-11 ENCOUNTER — Encounter: Payer: Self-pay | Admitting: Orthopedic Surgery

## 2018-02-11 ENCOUNTER — Ambulatory Visit (INDEPENDENT_AMBULATORY_CARE_PROVIDER_SITE_OTHER): Payer: 59 | Admitting: Orthopedic Surgery

## 2018-02-11 DIAGNOSIS — Z9889 Other specified postprocedural states: Secondary | ICD-10-CM

## 2018-02-11 NOTE — Progress Notes (Signed)
GLOBAL PERIOD POST OP APPT   POD # 4  Encounter Diagnosis  Name Primary?  . S/P right knee arthroscopy 02/07/18     Knee looks good but it is swollen her knee bend is 90 degrees of flexion and she has a little bit trouble extending but on cueing can get it all the way down  Stitches were taken out patient is advised to do knee extension exercises flexion exercises and come back in about 2 weeks  02/07/2018  10:32 AM  PATIENT:  Jessica Pacheco  42 y.o. female  PRE-OPERATIVE DIAGNOSIS:  torn medial menscus and lateral meniscus of the right knee  POST-OPERATIVE DIAGNOSIS:  LATERAL MENISCUS TEAR; ARTHRITIS; SYNOVITIS  PROCEDURE:  Procedure(s): KNEE ARTHROSCOPY WITH PARTIAL LATERAL MENISECTOMY (Right) 29881  FINDINGS:  Medial compartment small 5 x 3 superficial cartilage lesion previously noted resection intact remaining cartilaginous surfaces otherwise normal  Lateral compartment intact popliteus tendon, chondromalacia of the lateral femoral condyle and plateau mild with posterior horn body and anterior horn tear lateral meniscus  ACL intact PCL intact  Patellofemoral joint mild chondromalacia  Mild to moderate synovitis were seen in various portions of the joint specifically around the ACL popliteus tendon and some in the suprapatellar pouch.  This contributed to appearance of ACL abnormality on MRI and popliteus tendon abnormality on MRI

## 2018-02-11 NOTE — Patient Instructions (Signed)
Knee straightening exercises  Cryo/Cuff more often  Knee bending exercises

## 2018-02-18 MED FILL — ORILISSA 150 MG TABS: 150 | 28 days supply | Qty: 28 | Fill #4

## 2018-02-18 MED FILL — LEFLUNOMIDE 20 MG TABLET: 20 | 30 days supply | Qty: 30 | Fill #1

## 2018-02-27 ENCOUNTER — Encounter: Payer: Self-pay | Admitting: Orthopedic Surgery

## 2018-02-27 ENCOUNTER — Ambulatory Visit (INDEPENDENT_AMBULATORY_CARE_PROVIDER_SITE_OTHER): Payer: 59 | Admitting: Orthopedic Surgery

## 2018-02-27 VITALS — BP 128/100 | HR 106 | Ht 66.5 in | Wt 233.0 lb

## 2018-02-27 DIAGNOSIS — Z9889 Other specified postprocedural states: Secondary | ICD-10-CM

## 2018-02-27 NOTE — Patient Instructions (Signed)
Continue ice and exercises      

## 2018-02-27 NOTE — Progress Notes (Signed)
POSTOP VISIT  POD # 23  Chief Complaint  Patient presents with  . Routine Post Op    right knee arthroscopy 02/07/18    C/o pain and swelling   Right knee  Small to medium sized knee effusion   0-120 rom   Cane for ambulation   02/07/2018  10:32 AM  PATIENT:  Jessica Pacheco  43 y.o. female  PRE-OPERATIVE DIAGNOSIS:  torn medial menscus and lateral meniscus of the right knee  POST-OPERATIVE DIAGNOSIS:  LATERAL MENISCUS TEAR; ARTHRITIS; SYNOVITIS  PROCEDURE:  Procedure(s): KNEE ARTHROSCOPY WITH PARTIAL LATERAL MENISECTOMY (Right) 29881  FINDINGS:  Medial compartment small 5 x 3 superficial cartilage lesion previously noted resection intact remaining cartilaginous surfaces otherwise normal  Lateral compartment intact popliteus tendon, chondromalacia of the lateral femoral condyle and plateau mild with posterior horn body and anterior horn tear lateral meniscus  ACL intact PCL intact  Patellofemoral joint mild chondromalacia  Mild to moderate synovitis were seen in various portions of the joint specifically around the ACL popliteus tendon and some in the suprapatellar pouch.  This contributed to appearance of ACL abnormality on MRI and popliteus tendon abnormality on MRI   Encounter Diagnosis  Name Primary?  . S/P right knee arthroscopy 02/07/18 Yes    Knee swelling not unexpected due to RA and synovitis   Continue ice and elevation and exercises    Postoperative plan (Work, WB, No orders of the defined types were placed in this encounter. ,FU)  3 weeks

## 2018-03-13 MED FILL — CELECOXIB 200 MG CAP: 200 | 90 days supply | Qty: 90 | Fill #0

## 2018-03-15 ENCOUNTER — Other Ambulatory Visit: Payer: Self-pay | Admitting: Pharmacist

## 2018-03-15 MED ORDER — TOCILIZUMAB 162 MG/0.9ML ~~LOC~~ SOSY: PREFILLED_SYRINGE | SUBCUTANEOUS | 3 refills | Status: DC

## 2018-03-18 MED FILL — ORILISSA 150 MG TABS: 150 | 28 days supply | Qty: 28 | Fill #5

## 2018-03-18 MED FILL — ACTEMRA 162 MG/0.9 ML SYRN: 162 | 28 days supply | Qty: 2 | Fill #0

## 2018-03-20 ENCOUNTER — Encounter: Payer: Self-pay | Admitting: Orthopedic Surgery

## 2018-03-20 ENCOUNTER — Ambulatory Visit (INDEPENDENT_AMBULATORY_CARE_PROVIDER_SITE_OTHER): Payer: 59 | Admitting: Orthopedic Surgery

## 2018-03-20 VITALS — BP 147/96 | HR 103 | Ht 66.5 in | Wt 233.0 lb

## 2018-03-20 DIAGNOSIS — Z9889 Other specified postprocedural states: Secondary | ICD-10-CM

## 2018-03-20 NOTE — Progress Notes (Signed)
Chief Complaint  Patient presents with  . Post-op Follow-up    right knee arthroscopy 02/07/18     43 years old history rheumatoid arthritis status post knee arthroscopy 6 weeks ago had lateral meniscectomy and synovectomy  Still complains of stiffness and pain with walking for any time more than an hour  Using a cane she is back on all her rheumatology medications  She complains of a popping sensation on flexion extension has pain with terminal flexion  02/07/2018  10:32 AM  PATIENT:  Jessica Pacheco  43 y.o. female  PRE-OPERATIVE DIAGNOSIS:  torn medial menscus and lateral meniscus of the right knee  POST-OPERATIVE DIAGNOSIS:  LATERAL MENISCUS TEAR; ARTHRITIS; SYNOVITIS  PROCEDURE:  Procedure(s): KNEE ARTHROSCOPY WITH PARTIAL LATERAL MENISECTOMY (Right) 29881  FINDINGS:  Medial compartment small 5 x 3 superficial cartilage lesion previously noted resection intact remaining cartilaginous surfaces otherwise normal  Lateral compartment intact popliteus tendon, chondromalacia of the lateral femoral condyle and plateau mild with posterior horn body and anterior horn tear lateral meniscus  ACL intact PCL intact  Patellofemoral joint mild chondromalacia  Mild to moderate synovitis were seen in various portions of the joint specifically around the ACL popliteus tendon and some in the suprapatellar pouch.  This contributed to appearance of ACL abnormality on MRI and popliteus tendon abnormality on MRI  Exam shows a quiet knee there is no effusion she does have pain at terminal extension which is about 120 degrees she has full extension she has a good straight leg raise she is walking with a cane  Recommend follow-up on the 24th return to work on the 26th from the arthroscopy standpoint  Encounter Diagnosis  Name Primary?  . S/P right knee arthroscopy 02/07/18 Yes

## 2018-03-28 MED FILL — LEFLUNOMIDE 20 MG TABLET: 20 | 30 days supply | Qty: 30 | Fill #2

## 2018-04-02 DIAGNOSIS — E039 Hypothyroidism, unspecified: Secondary | ICD-10-CM | POA: Diagnosis not present

## 2018-04-09 DIAGNOSIS — I10 Essential (primary) hypertension: Secondary | ICD-10-CM | POA: Diagnosis not present

## 2018-04-09 DIAGNOSIS — M0609 Rheumatoid arthritis without rheumatoid factor, multiple sites: Secondary | ICD-10-CM | POA: Diagnosis not present

## 2018-04-09 DIAGNOSIS — Z6838 Body mass index (BMI) 38.0-38.9, adult: Secondary | ICD-10-CM | POA: Diagnosis not present

## 2018-04-09 DIAGNOSIS — Z79899 Other long term (current) drug therapy: Secondary | ICD-10-CM | POA: Diagnosis not present

## 2018-04-09 DIAGNOSIS — E039 Hypothyroidism, unspecified: Secondary | ICD-10-CM | POA: Diagnosis not present

## 2018-04-09 MED FILL — HYDROCHLOROTHIAZIDE 25 MG T: 25 | 90 days supply | Qty: 90 | Fill #0 | Status: TO

## 2018-04-09 MED FILL — AMLODIPINE BESYLATE 10 MG T: 10 | 90 days supply | Qty: 90 | Fill #1 | Status: TO

## 2018-04-09 MED FILL — ACTEMRA 162 MG/0.9 ML SYRN: 162 | 28 days supply | Qty: 2 | Fill #1

## 2018-04-09 MED FILL — LEVOTHYROXINE 112 MCG TAB: 112 | 90 days supply | Qty: 90 | Fill #1 | Status: TO

## 2018-04-15 ENCOUNTER — Ambulatory Visit (INDEPENDENT_AMBULATORY_CARE_PROVIDER_SITE_OTHER): Payer: 59 | Admitting: Orthopedic Surgery

## 2018-04-15 ENCOUNTER — Ambulatory Visit (INDEPENDENT_AMBULATORY_CARE_PROVIDER_SITE_OTHER): Payer: 59 | Admitting: Pharmacist

## 2018-04-15 ENCOUNTER — Encounter: Payer: Self-pay | Admitting: Pharmacist

## 2018-04-15 ENCOUNTER — Encounter: Payer: Self-pay | Admitting: Orthopedic Surgery

## 2018-04-15 VITALS — BP 140/89 | HR 115 | Ht 66.5 in | Wt 233.0 lb

## 2018-04-15 DIAGNOSIS — Z79899 Other long term (current) drug therapy: Secondary | ICD-10-CM

## 2018-04-15 DIAGNOSIS — Z9889 Other specified postprocedural states: Secondary | ICD-10-CM

## 2018-04-15 NOTE — Progress Notes (Signed)
   S: Patient presents for review of their specialty medication therapy.  Patient is currently taking Actemra for rheumatoid arthritis. Patient is managed by Dr. Cardell Peach for this.   She has failed Enbrel, Humira, Orencia, and Xeljanz.   Adherence: denies any missed doses  Efficacy: it is working well for her and she denies any adverse effects. Just saw the rheumatologist the other day and things are going well.   Dosing: 162 mg every other week.   Dose adjustments: Renal: no dose adjustments (has not been studied) Hepatic: no dose adjustments (has not been studied)  Drug-drug interactions:none   Screening: TB test: completed per patient Hepatitis: completed per patient  Monitoring: S/sx of infection: denies, did have a cold recently.  Increased LFTs: slightly elevated ALT. Injection site reaction: denies CBC: WNL, except HCT 47.8   O:    See CareEverywhere for labs.      A/P: 1. Medication review: Patient currently on Actemra for rheumatoid arthritis and it is working well for her and she denies any adverse effects. Tocilizumab is an antagonist of the interleukin-6 (IL-6) receptor. Endogenous IL-6 is induced by inflammatory stimuli and mediates a variety of immunological responses. Inhibition of IL-6 receptors by tocilizumab leads to a reduction in cytokine and acute phase reactant production. Possible adverse effects include: increase risk of infection, malignancy, hyperlipidemia, injection site reaction, GI perforation, and thrombocytopenia/neutropenia. Patient to closely follow with rheumatologist. No recommendations for any changes.    Alvino Blood, PharmD, BCPS, BCACP, CPP Clinical Pharmacist Practitioner  [phone]        '

## 2018-04-15 NOTE — Progress Notes (Signed)
POSTOP VISIT  POD # 28  Chief Complaint  Patient presents with  . Post-op Follow-up    02/07/18 right knee scope   Nataija comes in today for her routine follow-up appointment she has pain along the lateral side of her knee she still using a cane  She has full extension normal passive range of motion with full flexion tenderness in the ITB along the knee  Recommend injection she approved  Sterile conditions alcohol and ethyl chloride were used followed by injection of 40 mg of Depo-Medrol 3 cc 1% lidocaine in the area of maximal tenderness  Patient to follow-up in a month  Prior history is noted below  Encounter Diagnosis  Name Primary?  . S/P right knee arthroscopy 02/07/18 Yes     02/07/2018  10:32 AM  PATIENT:  [name]  43 y.o. female  PRE-OPERATIVE DIAGNOSIS:  torn medial menscus and lateral meniscus of the right knee  POST-OPERATIVE DIAGNOSIS:  LATERAL MENISCUS TEAR; ARTHRITIS; SYNOVITIS  PROCEDURE:  Procedure(s): KNEE ARTHROSCOPY WITH PARTIAL LATERAL MENISECTOMY (Right) 29881  FINDINGS:  Medial compartment small 5 x 3 superficial cartilage lesion previously noted resection intact remaining cartilaginous surfaces otherwise normal  Lateral compartment intact popliteus tendon, chondromalacia of the lateral femoral condyle and plateau mild with posterior horn body and anterior horn tear lateral meniscus  ACL intact PCL intact  Patellofemoral joint mild chondromalacia  Mild to moderate synovitis were seen in various portions of the joint specifically around the ACL popliteus tendon and some in the suprapatellar pouch.  This contributed to appearance of ACL abnormality on MRI and popliteus tendon abnormality on MRI   Encounter Diagnosis  Name Primary?  . S/P right knee arthroscopy 02/07/18 Yes    Postoperative plan (Work, WB, No orders of the defined types were placed in this encounter. ,FU)  Continue home exercise program Use ice twice a day And  max was twice a day to treat the tendinitis

## 2018-04-17 MED FILL — ORILISSA 150 MG TABS: 150 | 28 days supply | Qty: 28 | Fill #6

## 2018-04-26 MED FILL — LEFLUNOMIDE 20 MG TABLET: 20 | 30 days supply | Qty: 30 | Fill #3

## 2018-04-28 IMAGING — CT CT ABD-PELV W/ CM
2 of 4 series · 15 of 46 positions shown, 17 images · IV contrast (Isovue)
Comparison: 09/15/2013

CLINICAL DATA: RIGHT-side pelvic pain for 3 weeks, history of
cervical polyp post D and Celsius January 2016, endometriosis of
the uterus, rheumatoid arthritis, kidney stones

EXAM:
CT ABDOMEN AND PELVIS WITH CONTRAST
TECHNIQUE: Multidetector CT imaging of the abdomen and pelvis was performed
using the standard protocol following bolus administration of
intravenous contrast. Sagittal and coronal MPR images reconstructed
from axial data set.
CONTRAST:  100mL [id number] IOPAMIDOL ([id number]) INJECTION 61% IV.
Dilute oral contrast.

[Series 2: axial st · axial · 0.78mm/px · z∈[+904,+1334]mm · 12 of 96 slices shown, 14 images]
[im 5/96  soft-tissue]
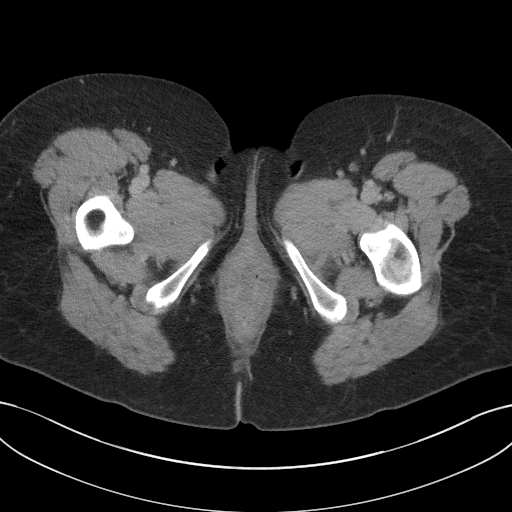
[im 5/96  bone]
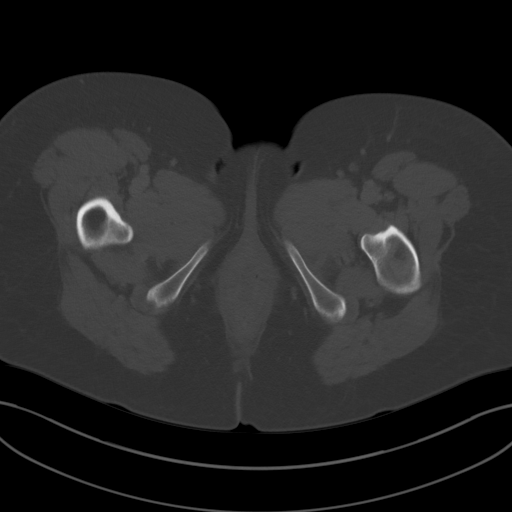
[im 13/96  soft-tissue]
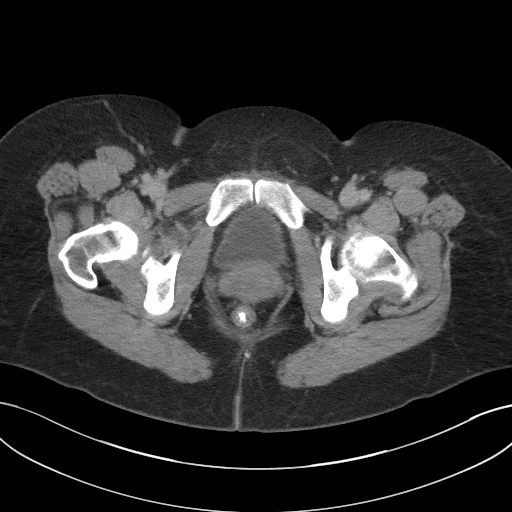
[im 21/96  soft-tissue]
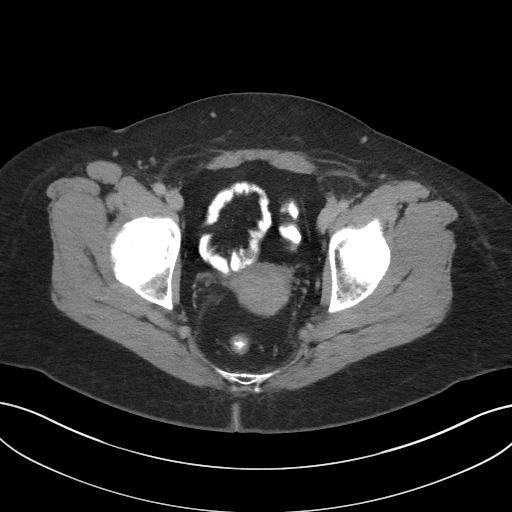
[im 29/96  soft-tissue]
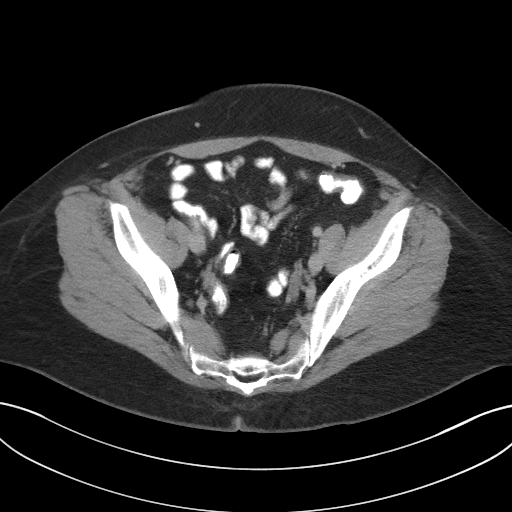
[im 38/96  soft-tissue]
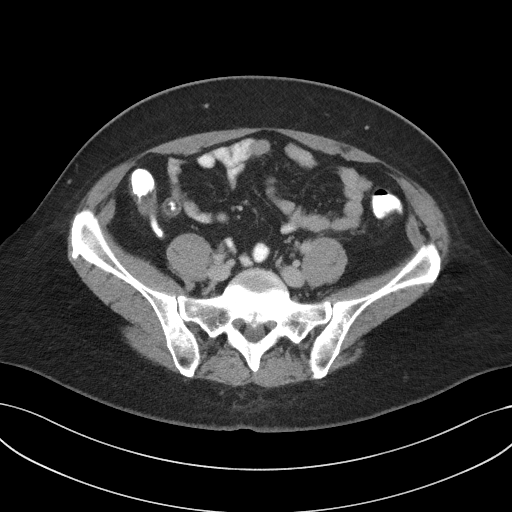
[im 46/96  soft-tissue]
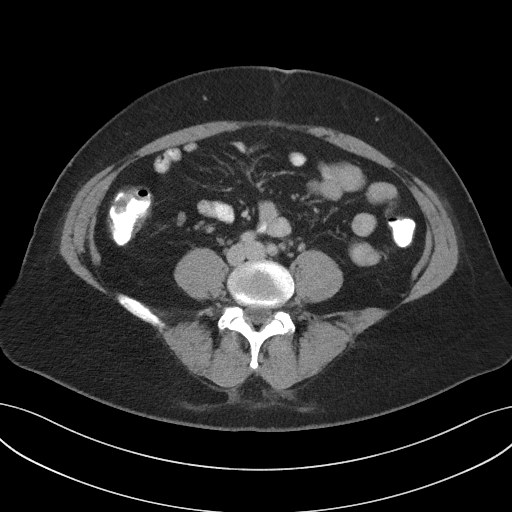
[im 50/96  soft-tissue]
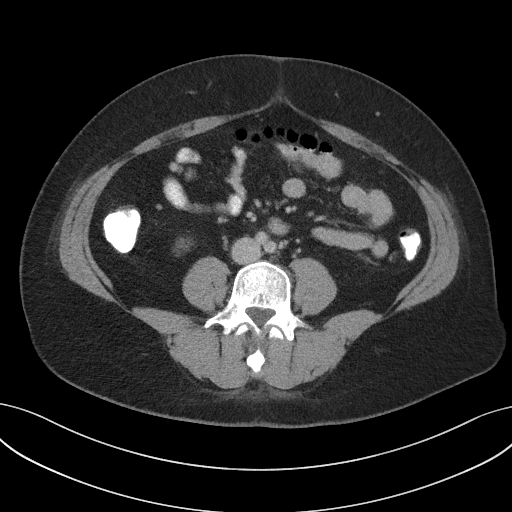
[im 58/96  soft-tissue]
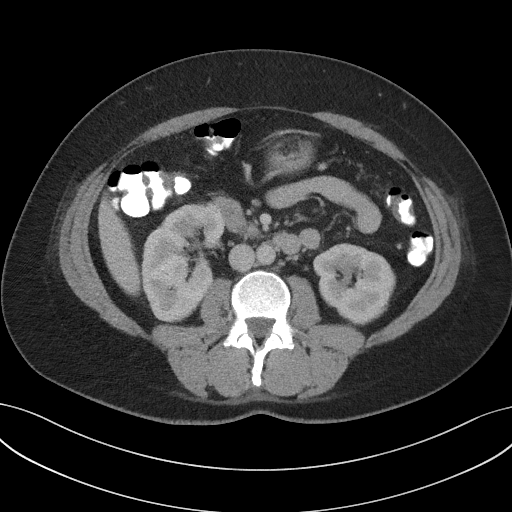
[im 67/96  soft-tissue]
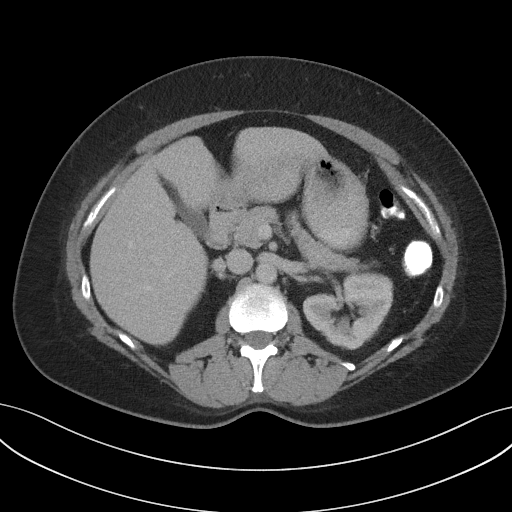
[im 67/96  bone]
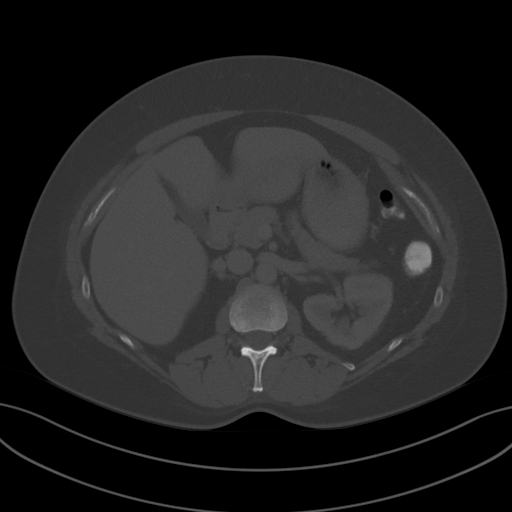
[im 75/96  soft-tissue]
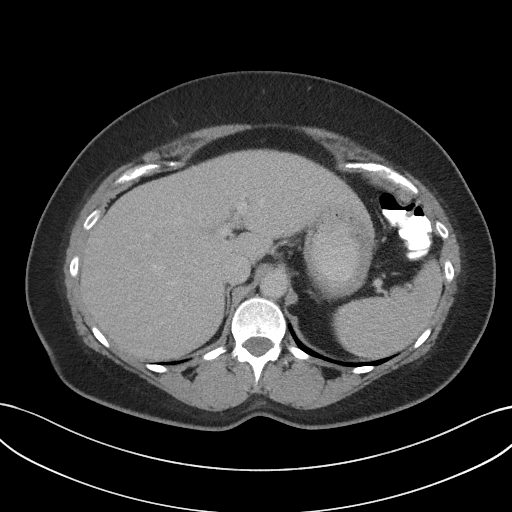
[im 83/96  soft-tissue]
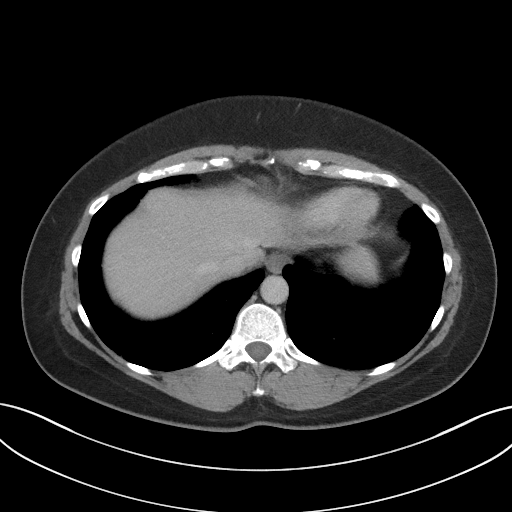
[im 91/96  soft-tissue]
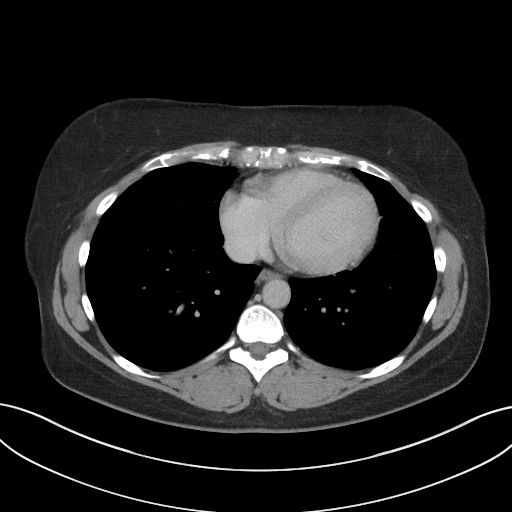

[Series 4: coronal st · coronal · 0.75mm/px · 3 of 100 slices shown]
[im 34/100  soft-tissue]
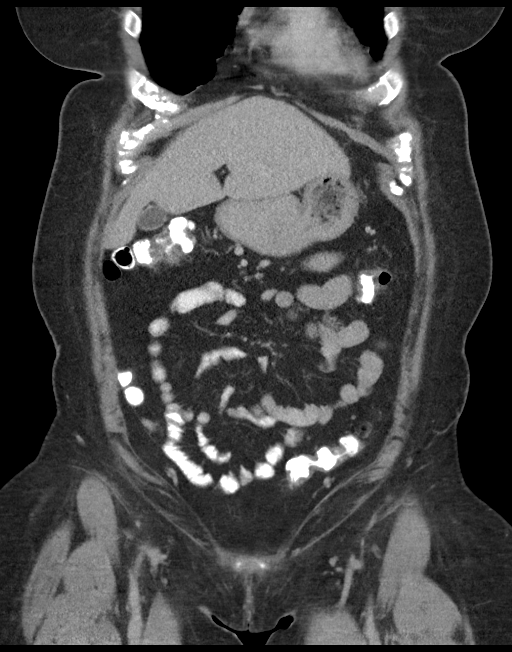
[im 45/100  soft-tissue]
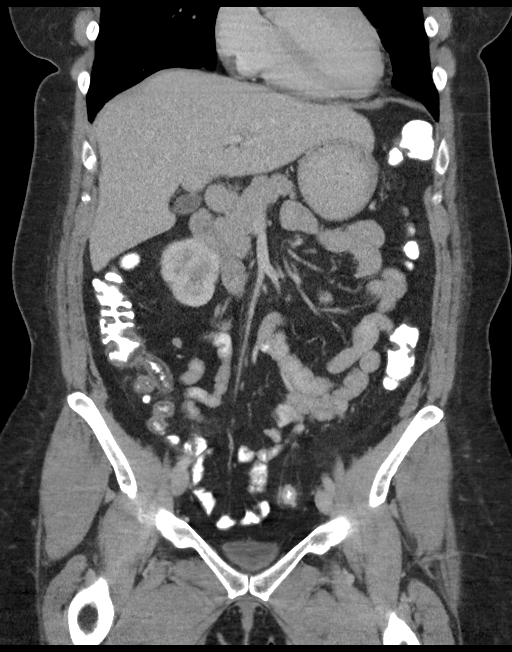
[im 56/100  soft-tissue]
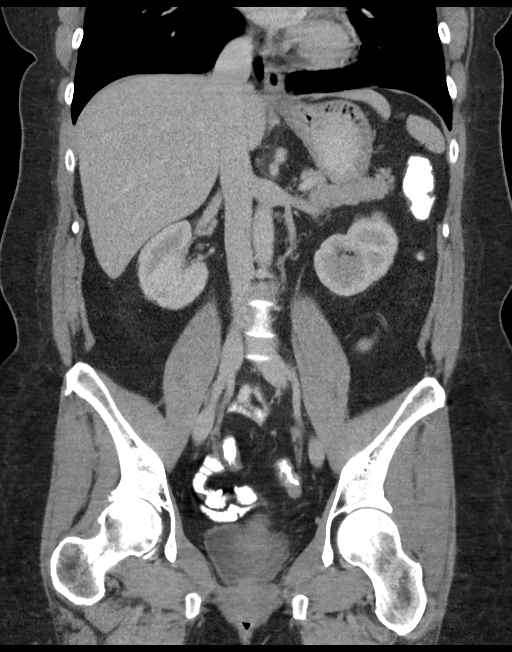

[15 of 46 positions shown; findings below may reference images not displayed]

FINDINGS: Lower chest: Lung bases clear

Hepatobiliary: Small low-attenuation lesion within RIGHT lobe liver
near gallbladder fossa, 8 mm greatest size, question tiny cyst,
stable. Gallbladder and liver otherwise normal appearance

Pancreas: Normal appearance

Spleen: Normal appearance

Adrenals/Urinary Tract: Adrenal glands normal appearance. Multiple
tiny nonobstructing RIGHT renal calculi. Kidneys otherwise normal
appearance. No renal mass, hydronephrosis or hydroureter. Bladder
unremarkable

Stomach/Bowel: Normal appendix. Mild fibrofatty thickening of the
terminal ileum through proximal ascending colon without surrounding
infiltration; this could reflect a prior inflammatory process.
Stomach and small bowel loops otherwise unremarkable.

Vascular/Lymphatic: Aorta normal caliber. No adenopathy. Normal
sized lymph nodes in mesentery medial to RIGHT colon while largest 9
mm short axis.

Reproductive: Normal appearing uterus and ovaries.

Other: No free air or free fluid. LEFT inguinal hernia containing
fat. Mildly dilated RIGHT internal inguinal ring without discrete
hernia.

Musculoskeletal: Joint space narrowing and minimal spur formation at
RIGHT hip joint. BILATERAL spondylolysis of L3 without
spondylolisthesis.
IMPRESSION: Mild fibrofatty wall thickening of the terminal ileum to proximal
ascending colon, nonspecific, without surrounding edema/acute
inflammation; this could be the result of a prior inflammatory
process or enteritis.

LEFT inguinal hernia containing fat.

Tiny nonobstructing RIGHT renal calculi.

BILATERAL spondylolysis L3 without spondylolisthesis.

Arthritic changes of the hips, greater on RIGHT.

## 2018-05-08 MED FILL — ACTEMRA 162 MG/0.9 ML SYRN: 162 | 28 days supply | Qty: 2 | Fill #2

## 2018-05-09 DIAGNOSIS — I10 Essential (primary) hypertension: Secondary | ICD-10-CM | POA: Diagnosis not present

## 2018-05-13 ENCOUNTER — Encounter: Payer: Self-pay | Admitting: Orthopedic Surgery

## 2018-05-13 ENCOUNTER — Other Ambulatory Visit: Payer: Self-pay

## 2018-05-13 ENCOUNTER — Ambulatory Visit (INDEPENDENT_AMBULATORY_CARE_PROVIDER_SITE_OTHER): Payer: 59 | Admitting: Orthopedic Surgery

## 2018-05-13 VITALS — BP 129/83 | HR 106 | Ht 66.5 in | Wt 233.0 lb

## 2018-05-13 DIAGNOSIS — M069 Rheumatoid arthritis, unspecified: Secondary | ICD-10-CM

## 2018-05-13 DIAGNOSIS — Z9889 Other specified postprocedural states: Secondary | ICD-10-CM | POA: Diagnosis not present

## 2018-05-13 DIAGNOSIS — M25561 Pain in right knee: Secondary | ICD-10-CM

## 2018-05-13 DIAGNOSIS — G8929 Other chronic pain: Secondary | ICD-10-CM

## 2018-05-13 NOTE — Progress Notes (Signed)
Chief Complaint  Patient presents with  . Routine Post Op    02/07/18 right knee arthroscopy felt a pop yesterday while twisting    43 years old history rheumatoid arthritis on appropriate medication presents for reevaluation after injection of the lateral soft tissues.  She had a popping episode yesterday when she was turning  System review Review of Systems  Constitutional: Negative for chills and fever.  Skin: Negative.  Negative for rash.  Neurological: Negative for sensory change.    BP 129/83   Pulse (!) 106   Ht 5' 6.5" (1.689 m)   Wt 233 lb (105.7 kg)   BMI 37.04 kg/m   Right knee looks quiet no warmth no rash periarticular tenderness seems diffuse.  Still has lateral soft tissue tenderness iliotibial band lateral collateral ligament consistent with findings at scope and MRI with synovitis  She has an excellent straight leg raise no extensor lag, her knee feels stable in the anterior posterior plane in the coronal plane.  Neurovascular exam remains intact skin looks normal.  She is ambulatory with a cane but no limp  Encounter Diagnoses  Name Primary?  . S/P right knee arthroscopy 02/07/18 Yes  . Chronic pain of right knee   . Rheumatoid arthritis involving right knee, unspecified rheumatoid factor presence (HCC)     POD # 95  PROCEDURE:  Procedure(s): KNEE ARTHROSCOPY WITH PARTIAL LATERAL MENISECTOMY (Right) 29881  FINDINGS:  Medial compartment small 5 x 3 superficial cartilage lesion previously noted resection intact remaining cartilaginous surfaces otherwise normal  Lateral compartment intact popliteus tendon, chondromalacia of the lateral femoral condyle and plateau mild with posterior horn body and anterior horn tear lateral meniscus  ACL intact PCL intact  Patellofemoral joint mild chondromalacia  Mild to moderate synovitis were seen in various portions of the joint specifically around the ACL popliteus tendon and some in the suprapatellar pouch.   This contributed to appearance of ACL abnormality on MRI and popliteus tendon abnormality on MRI  Economy hinge brace    Plan: F/U 1 year   X-rays in 1 year   Wear brace full time

## 2018-05-16 MED FILL — ORILISSA 150 MG TABS: 150 | 28 days supply | Qty: 28 | Fill #0

## 2018-05-20 ENCOUNTER — Telehealth (INDEPENDENT_AMBULATORY_CARE_PROVIDER_SITE_OTHER): Payer: Self-pay | Admitting: Radiology

## 2018-05-20 MED FILL — LEFLUNOMIDE 20 MG TABLET: 20 | 30 days supply | Qty: 30 | Fill #0 | Status: TO

## 2018-05-20 NOTE — Telephone Encounter (Signed)
Patient called and said that the brace is hurting her worse than before she started wearing it.  She says she now has pain in other places that she did not before.  She wants to speak to you about this. Please call her, thanks.

## 2018-05-21 NOTE — Telephone Encounter (Signed)
I gave patient instructions on calling, told her to return the brace when the COVID has cleared the area. She agrees

## 2018-09-24 ENCOUNTER — Other Ambulatory Visit (HOSPITAL_COMMUNITY): Payer: Self-pay | Admitting: Obstetrics and Gynecology

## 2018-09-24 DIAGNOSIS — Z1231 Encounter for screening mammogram for malignant neoplasm of breast: Secondary | ICD-10-CM

## 2018-09-26 ENCOUNTER — Other Ambulatory Visit: Payer: Self-pay

## 2018-09-26 ENCOUNTER — Ambulatory Visit (HOSPITAL_COMMUNITY)
Admission: RE | Admit: 2018-09-26 | Discharge: 2018-09-26 | Disposition: A | Discharge status: Home / Self Care | Payer: BC Managed Care – PPO | Source: Ambulatory Visit | Attending: Obstetrics and Gynecology | Admitting: Obstetrics and Gynecology

## 2018-09-26 DIAGNOSIS — Z1231 Encounter for screening mammogram for malignant neoplasm of breast: Secondary | ICD-10-CM | POA: Insufficient documentation

## 2019-02-09 ENCOUNTER — Ambulatory Visit
Admission: EM | Admit: 2019-02-09 | Discharge: 2019-02-09 | Disposition: A | Discharge status: Home / Self Care | Payer: Commercial Managed Care - PPO | Attending: Emergency Medicine | Admitting: Emergency Medicine

## 2019-02-09 ENCOUNTER — Other Ambulatory Visit: Payer: Self-pay

## 2019-02-09 DIAGNOSIS — R059 Cough, unspecified: Secondary | ICD-10-CM

## 2019-02-09 DIAGNOSIS — U071 COVID-19: Secondary | ICD-10-CM

## 2019-02-09 DIAGNOSIS — R05 Cough: Secondary | ICD-10-CM

## 2019-02-09 LAB — POC SARS CORONAVIRUS 2 AG -  ED: SARS Coronavirus 2 Ag: POSITIVE — AB

## 2019-02-09 MED ORDER — FLUTICASONE PROPIONATE 50 MCG/ACT NA SUSP: 1.0000 | Freq: Every day | NASAL | 0 refills | Status: DC

## 2019-02-09 MED ORDER — BENZONATATE 100 MG PO CAPS: 100.0000 mg | ORAL_CAPSULE | Freq: Three times a day (TID) | ORAL | 0 refills | Status: DC

## 2019-02-09 NOTE — Discharge Instructions (Addendum)
Point-of-care COVID-19 test was positive  In the meantime: You should remain isolated in your home for 10 days from symptom onset  Get plenty of rest and push fluids Tessalon Perles prescribed for cough Flonase prescribed for nasal congestion and runny nose Use medications daily for symptom relief Use OTC medications like ibuprofen or tylenol as needed fever or pain Call or go to the ED if you have any new or worsening symptoms such as fever, worsening cough, shortness of breath, chest tightness, chest pain, turning blue, changes in mental status, etc..Marland Kitchen

## 2019-02-09 NOTE — ED Triage Notes (Signed)
Pt presents with sinus symptoms and cough for past 3 days

## 2019-02-09 NOTE — ED Provider Notes (Signed)
[facility]    CSN: [id number] Arrival date & time: 02/09/19  1433      History   Chief Complaint No chief complaint on file.   HPI Jessica Pacheco is a 43 y.o. female.   Jessica Pacheco 43 years old female presented to urgent care for complaint of cough for the past 3 days.  Denies sick exposure to COVID, flu or strep.  Denies recent travel.  Denies aggravating or alleviating symptoms.  Denies previous COVID infection.   Denies fever, chills, fatigue, nasal congestion, rhinorrhea, sore throat,SOB, wheezing, chest pain, nausea, vomiting, changes in bowel or bladder habits.    The history is provided by the patient. No language interpreter was used.    Past Medical History:  Diagnosis Date  . Chronic right lower quadrant pain   . DDD (degenerative disc disease), cervical    c6-7  . GERD (gastroesophageal reflux disease)    WATCHES DIET  . History of hypoglycemia   . History of kidney stones    passed spontaneously  . Hypertension   . Hypothyroidism   . OA (osteoarthritis)    knees , hips, feet  . PONV (postoperative nausea and vomiting)   . Rheumatoid arthritis(714.0) dx  2001  multiple sites   rheumotologist-  dr Molly Maduro gay w/ [facility]--- currently treated w/ oral arava and iv actemra  . SUI (stress urinary incontinence), female     Patient Active Problem List   Diagnosis Date Noted  . S/P right knee arthroscopy 02/07/18 02/07/2018  . Meniscus, lateral, derangement, right   . Synovitis of right knee   . Arthritis of right knee   . Osteoarthritis, multiple sites 01/14/2018  . Rheumatoid arthritis of multiple sites with negative rheumatoid factor (HCC) 05/08/2016  . Primary osteoarthritis of both knees 08/30/2015  . Primary osteoarthritis of right hip 08/30/2015  . Primary osteoarthritis of left foot 06/30/2014  . Change in stool 07/27/2011  . Contraception 01/02/2011  . Rheumatoid arthritis(714.0) 02/21/1999    Past Surgical History:  Procedure  Laterality Date  . D & C HYSTERECTOMY /  MYOSURE RESECTION OF POLYP  2017     dr Marcelle Overlie  . KNEE ARTHROSCOPY Right 1999  . KNEE ARTHROSCOPY WITH MEDIAL MENISECTOMY Right 02/07/2018   Procedure: KNEE ARTHROSCOPY WITH PARTIAL LATERAL MENISECTOMY;  Surgeon: Vickki Hearing, MD;  Location: [facility];  Service: Orthopedics;  Laterality: Right;  . LAPAROSCOPY N/A 02/16/2017   Procedure: LAPAROSCOPY DIAGNOSTIC;  Surgeon: Richarda Overlie, MD;  Location: [facility];  Service: Gynecology;  Laterality: N/A;  . WISDOM TOOTH EXTRACTION      OB History   No obstetric history on file.      Home Medications    Prior to Admission medications   Medication Sig Start Date End Date Taking? Authorizing Provider  amLODipine (NORVASC) 10 MG tablet Take 10 mg by mouth daily.  01/07/18   [provider]  benzonatate (TESSALON) 100 MG capsule Take 1 capsule (100 mg total) by mouth every 8 (eight) hours. 02/09/19   Marcea Rojek, Zachery Dakins, FNP  celecoxib (CELEBREX) 200 MG capsule Take 200 mg by mouth daily as needed for moderate pain.  01/01/18   [provider]  Elagolix Sodium (ORILISSA) 150 MG TABS Take 150 mg by mouth daily.  03/14/17   [provider]  fluticasone (FLONASE) 50 MCG/ACT nasal spray Place 1 spray into both nostrils daily for 14 days. 02/09/19 02/23/19  Fredia Chittenden, Zachery Dakins, FNP  hydrochlorothiazide (HYDRODIURIL) 25 MG tablet  04/09/18   [provider]  leflunomide (ARAVA) 20 MG tablet Take 20 mg by mouth at bedtime.     [provider]  levothyroxine (SYNTHROID, LEVOTHROID) 112 MCG tablet Take 112 mcg by mouth daily before breakfast.  01/07/18   [provider]  loratadine (CLARITIN) 10 MG tablet Take 10 mg by mouth daily.    [provider]  Tocilizumab (ACTEMRA) 162 MG/0.9ML SOSY Inject 162 mg into skin every 14 days 03/15/18   Tresa Garter, MD    Family History History reviewed. No pertinent family  history.  Social History Social History   Tobacco Use  . Smoking status: Never Smoker  . Smokeless tobacco: Never Used  Substance Use Topics  . Alcohol use: No  . Drug use: No     Allergies   Patient has no known allergies.   Review of Systems Review of Systems  Constitutional: Negative.   HENT: Negative.   Respiratory: Negative.   Cardiovascular: Negative.   Gastrointestinal: Negative.   Neurological: Negative.   ROS: All other are negatives   Physical Exam Triage Vital Signs ED Triage Vitals  Enc Vitals Group     BP 02/09/19 1444 128/89     Pulse Rate 02/09/19 1444 90     Resp 02/09/19 1444 16     Temp 02/09/19 1444 98.8 F (37.1 C)     Temp Source 02/09/19 1444 Oral     SpO2 02/09/19 1444 92 %     Weight --      Height --      Head Circumference --      Peak Flow --      Pain Score 02/09/19 1448 0     Pain Loc --      Pain Edu? --      Excl. in [place]? --    No data found.  Updated Vital Signs BP 128/89 (BP Location: Right Arm)   Pulse 90   Temp 98.8 F (37.1 C) (Oral)   Resp 16   SpO2 92%   Visual Acuity Right Eye Distance:   Left Eye Distance:   Bilateral Distance:    Right Eye Near:   Left Eye Near:    Bilateral Near:     Physical Exam Vitals and nursing note reviewed.  Constitutional:      General: She is not in acute distress.    Appearance: Normal appearance. She is normal weight. She is not ill-appearing or toxic-appearing.  HENT:     Head: Normocephalic.     Right Ear: Tympanic membrane, ear canal and external ear normal. There is no impacted cerumen.     Left Ear: Ear canal and external ear normal. There is no impacted cerumen.     Nose: Nose normal. No congestion.     Mouth/Throat:     Mouth: Mucous membranes are moist.     Pharynx: No oropharyngeal exudate or posterior oropharyngeal erythema.  Cardiovascular:     Rate and Rhythm: Normal rate and regular rhythm.     Pulses: Normal pulses.     Heart sounds: Normal heart  sounds. No murmur.  Pulmonary:     Effort: Pulmonary effort is normal. No respiratory distress.     Breath sounds: No wheezing or rhonchi.  Chest:     Chest wall: No tenderness.  Abdominal:     General: Abdomen is flat. Bowel sounds are normal. There is no distension.     Palpations: There is no mass.  Skin:  Capillary Refill: Capillary refill takes less than 2 seconds.  Neurological:     Mental Status: She is alert and oriented to person, place, and time.      UC Treatments / Results  Labs (all labs ordered are listed, but only abnormal results are displayed) Labs Reviewed  POC SARS CORONAVIRUS 2 AG -  ED - Abnormal; Notable for the following components:      Result Value   SARS Coronavirus 2 Ag Positive (*)    All other components within normal limits    EKG   Radiology No results found.  Procedures Procedures (including critical care time)  Medications Ordered in UC Medications - No data to display  Initial Impression / Assessment and Plan / UC Course  I have reviewed the triage vital signs and the nursing notes.  Pertinent labs & imaging results that were available during my care of the patient were reviewed by me and considered in my medical decision making (see chart for details).   Patient stable for discharge. Point-of-care COVID-19 test was positive.  Advised patient to quarantine.  To go to ED for worsening symptoms.  Patient verbalized understanding of the plan of care.  Patient verbalized understanding of the plan of care.   Final Clinical Impressions(s) / UC Diagnoses   Final diagnoses:  Cough  COVID-19 virus infection     Discharge Instructions     Point-of-care COVID-19 test was positive  In the meantime: You should remain isolated in your home for 10 days from symptom onset  Get plenty of rest and push fluids Tessalon Perles prescribed for cough Flonase prescribed for nasal congestion and runny nose Use medications daily for symptom  relief Use OTC medications like ibuprofen or tylenol as needed fever or pain Call or go to the ED if you have any new or worsening symptoms such as fever, worsening cough, shortness of breath, chest tightness, chest pain, turning blue, changes in mental status, etc...     ED Prescriptions    Medication Sig Dispense Auth. Provider   benzonatate (TESSALON) 100 MG capsule Take 1 capsule (100 mg total) by mouth every 8 (eight) hours. 21 capsule Horacio Werth S, FNP   fluticasone (FLONASE) 50 MCG/ACT nasal spray Place 1 spray into both nostrils daily for 14 days. 16 g Durward Parcel, FNP     PDMP not reviewed this encounter.   Durward Parcel, FNP 02/09/19 1534

## 2019-02-26 IMAGING — DX DG CERVICAL SPINE 2 OR 3 VIEWS
3 series · 3 of 3 positions shown · non-contrast
Comparison: None

CLINICAL DATA: Rheumatoid arthritis, preoperative clearance

EXAM:
CERVICAL SPINE - 2-3 VIEW

[c-spine lat (1 of 3)]
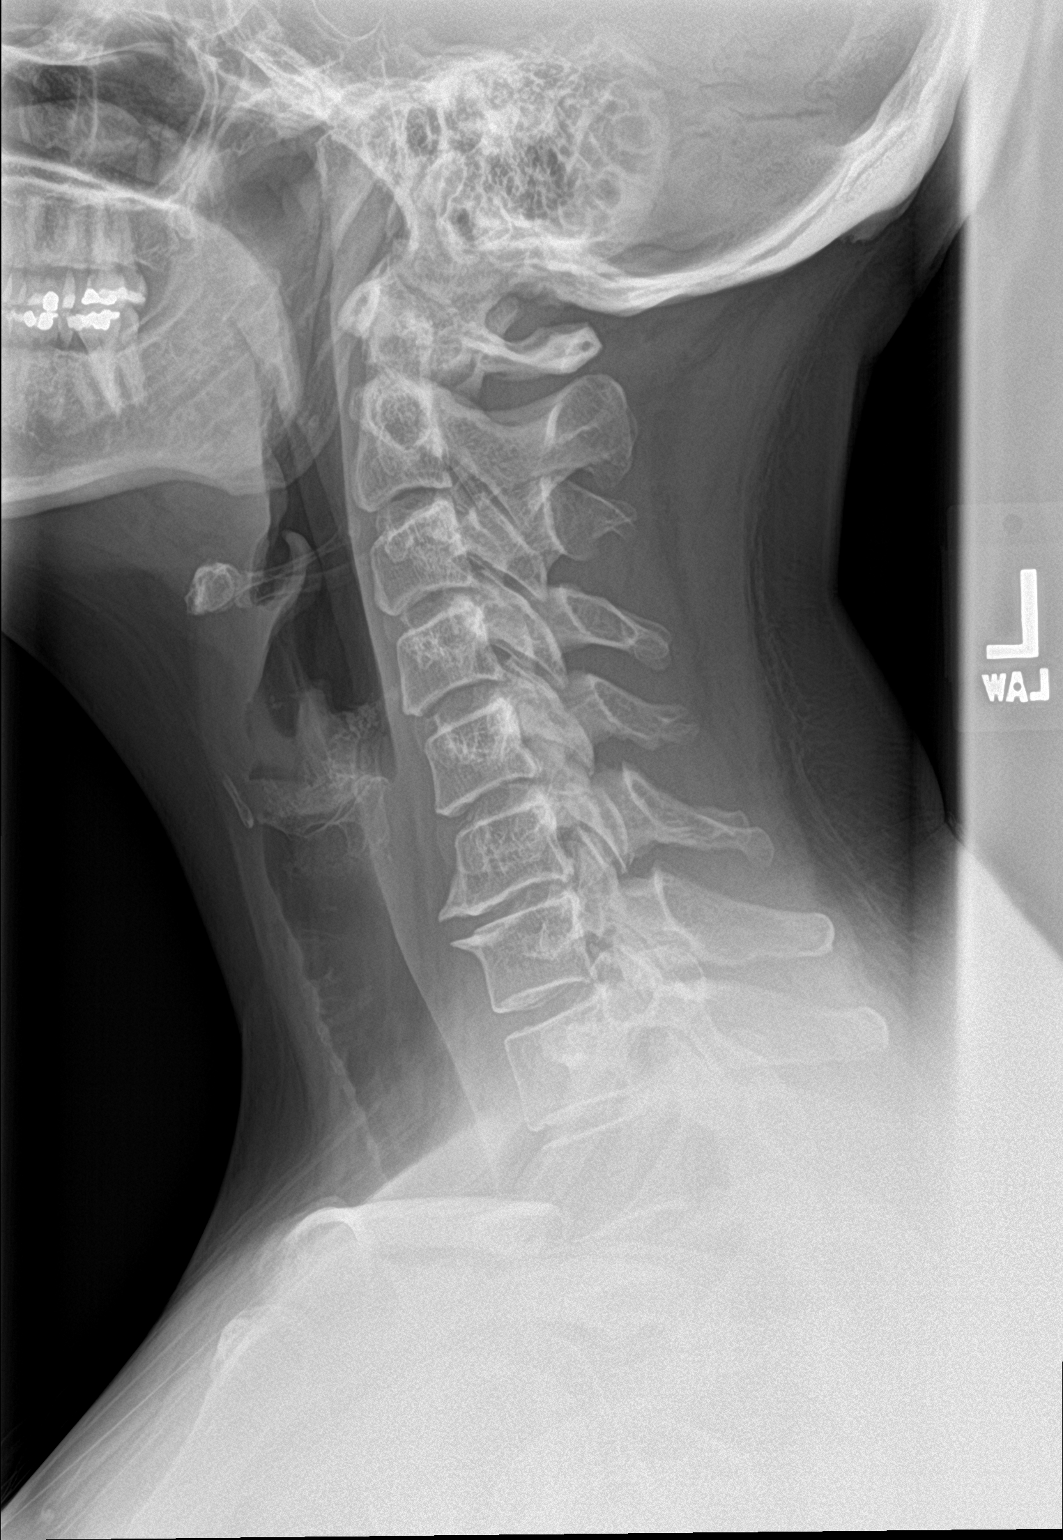

[c-spine lat (2 of 3)]
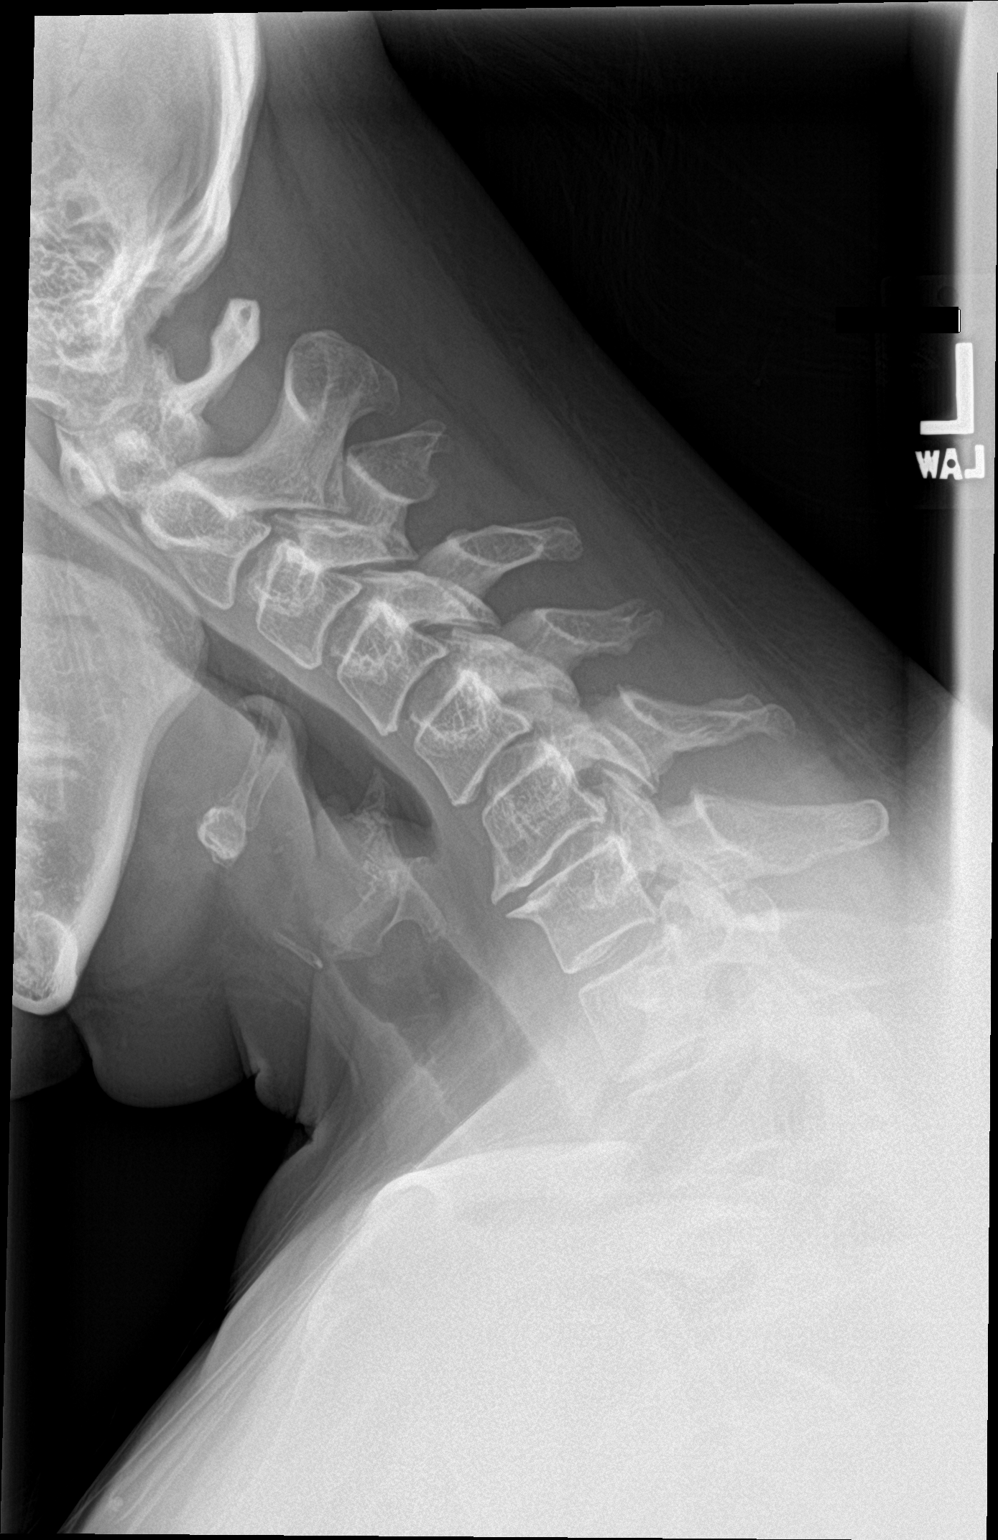

[c-spine lat (3 of 3)]
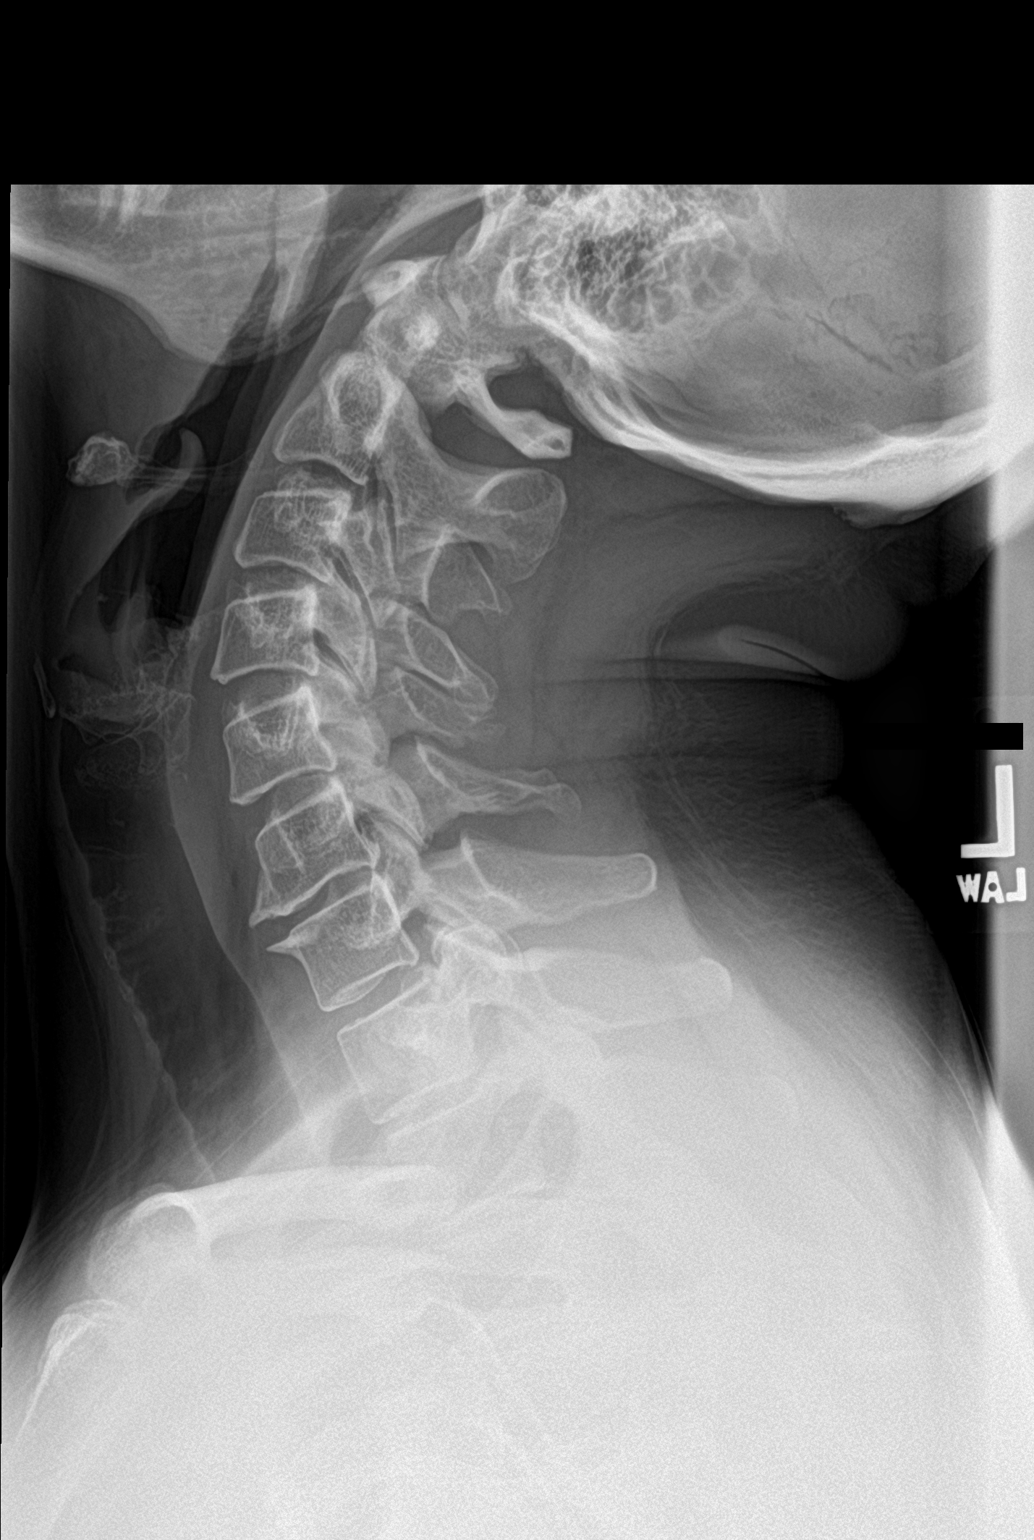

[3 of 3 positions shown; findings below may reference images not displayed]

FINDINGS: Lateral neutral, flexion, and extension views.

Prevertebral soft tissues normal thickness.

Osseous mineralization normal.

Mild disc space narrowing with endplate spur formation at C6-C7.

Vertebral body and disc space heights otherwise maintained.

No fracture, subluxation or bone destruction.

Predental space is normal in thickness at neutral position and
remains unchanged with flexion and extension.

No abnormal motion with flexion or extension.
IMPRESSION: Degenerative disc disease changes at C6-C7.

Otherwise negative exam, with no abnormal motion identified with
flexion or extension.

## 2019-05-14 ENCOUNTER — Encounter: Payer: Self-pay | Admitting: Orthopedic Surgery

## 2019-05-14 ENCOUNTER — Ambulatory Visit (INDEPENDENT_AMBULATORY_CARE_PROVIDER_SITE_OTHER): Payer: Commercial Managed Care - PPO | Admitting: Orthopedic Surgery

## 2019-05-14 ENCOUNTER — Other Ambulatory Visit: Payer: Self-pay

## 2019-05-14 ENCOUNTER — Ambulatory Visit: Payer: Commercial Managed Care - PPO

## 2019-05-14 VITALS — Temp 97.3°F | Ht 66.5 in | Wt 213.0 lb

## 2019-05-14 DIAGNOSIS — Z9889 Other specified postprocedural states: Secondary | ICD-10-CM

## 2019-05-14 DIAGNOSIS — M25561 Pain in right knee: Secondary | ICD-10-CM | POA: Diagnosis not present

## 2019-05-14 DIAGNOSIS — N801 Endometriosis of ovary: Secondary | ICD-10-CM | POA: Insufficient documentation

## 2019-05-14 DIAGNOSIS — G8929 Other chronic pain: Secondary | ICD-10-CM

## 2019-05-14 DIAGNOSIS — G43909 Migraine, unspecified, not intractable, without status migrainosus: Secondary | ICD-10-CM | POA: Insufficient documentation

## 2019-05-14 DIAGNOSIS — E039 Hypothyroidism, unspecified: Secondary | ICD-10-CM | POA: Insufficient documentation

## 2019-05-14 DIAGNOSIS — N80109 Endometriosis of ovary, unspecified side, unspecified depth: Secondary | ICD-10-CM | POA: Insufficient documentation

## 2019-05-14 DIAGNOSIS — I10 Essential (primary) hypertension: Secondary | ICD-10-CM | POA: Insufficient documentation

## 2019-05-14 DIAGNOSIS — M069 Rheumatoid arthritis, unspecified: Secondary | ICD-10-CM

## 2019-05-14 NOTE — Progress Notes (Signed)
Chief Complaint  Patient presents with  . Knee Pain    right/     This is a 1 year follow-up for 44 year old female with rheumatoid arthritis being followed by rheumatology  She had a right knee arthroscopy last year  PROCEDURE:  Procedure(s): KNEE ARTHROSCOPY WITH PARTIAL LATERAL MENISECTOMY (Right) 29881   FINDINGS:  Medial compartment small 5 x 3 superficial cartilage lesion previously noted resection intact remaining cartilaginous surfaces otherwise normal   Lateral compartment intact popliteus tendon, chondromalacia of the lateral femoral condyle and plateau mild with posterior horn body and anterior horn tear lateral meniscus   ACL intact PCL intact   Patellofemoral joint mild chondromalacia   Mild to moderate synovitis were seen in various portions of the joint specifically around the ACL popliteus tendon and some in the suprapatellar pouch.  This contributed to appearance of ACL abnormality on MRI and popliteus tendon abnormality on MRI    Jessica Pacheco does complain of occasional catching and popping right knee  Overall doing well.  She does use a cane.  She is on some new rheumatoid medications which we have recorded  She is on a new birth control pill as well  Her exam shows no warmth of the joint no swelling that I can detect she has good range of motion of 220 degrees full extension good straight leg raise  X-ray shows slight increase in narrowing in the lateral joint margin but still has a 6 degree femur tip angle  Encounter Diagnoses  Name Primary?  . Chronic pain of right knee Yes  . S/P right knee arthroscopy 02/07/18   . Rheumatoid arthritis, involving unspecified site, unspecified whether rheumatoid factor present (HCC)     Recommend 1 year follow-up, intermittent follow-up for joint aspiration injection when needed

## 2019-06-20 ENCOUNTER — Ambulatory Visit: Payer: Commercial Managed Care - PPO | Attending: Internal Medicine

## 2019-06-20 DIAGNOSIS — Z23 Encounter for immunization: Secondary | ICD-10-CM

## 2019-06-20 NOTE — Progress Notes (Signed)
   Covid-19 Vaccination Clinic  Name:  Jessica Pacheco    MRN: [id number] DOB: 11-24-1975  06/20/2019  [name] was observed post Covid-19 immunization for 15 minutes without incident. She was provided with Vaccine Information Sheet and instruction to access the V-Safe system.   [name] was instructed to call 911 with any severe reactions post vaccine: Marland Kitchen Difficulty breathing  . Swelling of face and throat  . A fast heartbeat  . A bad rash all over body  . Dizziness and weakness   Immunizations Administered    Name Date Dose VIS Date Route   Moderna COVID-19 Vaccine 06/20/2019  8:20 AM 0.5 mL 01/2019 Intramuscular   Manufacturer: Moderna   Lot: [id number]   NDC: 45848-350-75

## 2019-06-25 ENCOUNTER — Other Ambulatory Visit (HOSPITAL_COMMUNITY): Payer: Self-pay | Admitting: Internal Medicine

## 2019-06-25 DIAGNOSIS — M25551 Pain in right hip: Secondary | ICD-10-CM

## 2019-06-25 DIAGNOSIS — G8929 Other chronic pain: Secondary | ICD-10-CM

## 2019-06-25 DIAGNOSIS — M1611 Unilateral primary osteoarthritis, right hip: Secondary | ICD-10-CM

## 2019-06-26 ENCOUNTER — Ambulatory Visit (HOSPITAL_COMMUNITY)
Admission: RE | Admit: 2019-06-26 | Discharge: 2019-06-26 | Disposition: A | Discharge status: Home / Self Care | Payer: Commercial Managed Care - PPO | Source: Ambulatory Visit | Attending: Internal Medicine | Admitting: Internal Medicine

## 2019-06-26 ENCOUNTER — Other Ambulatory Visit: Payer: Self-pay

## 2019-06-26 DIAGNOSIS — K6289 Other specified diseases of anus and rectum: Secondary | ICD-10-CM | POA: Insufficient documentation

## 2019-06-26 DIAGNOSIS — G8929 Other chronic pain: Secondary | ICD-10-CM | POA: Insufficient documentation

## 2019-06-26 DIAGNOSIS — M25551 Pain in right hip: Secondary | ICD-10-CM | POA: Diagnosis present

## 2019-06-26 DIAGNOSIS — M1611 Unilateral primary osteoarthritis, right hip: Secondary | ICD-10-CM | POA: Insufficient documentation

## 2019-07-18 ENCOUNTER — Other Ambulatory Visit: Payer: Self-pay

## 2019-07-18 ENCOUNTER — Encounter: Payer: Self-pay | Admitting: Orthopedic Surgery

## 2019-07-18 ENCOUNTER — Ambulatory Visit (INDEPENDENT_AMBULATORY_CARE_PROVIDER_SITE_OTHER): Payer: Commercial Managed Care - PPO | Admitting: Orthopedic Surgery

## 2019-07-18 VITALS — BP 131/97 | HR 86 | Ht 66.5 in | Wt 219.5 lb

## 2019-07-18 DIAGNOSIS — M7918 Myalgia, other site: Secondary | ICD-10-CM | POA: Diagnosis not present

## 2019-07-18 DIAGNOSIS — G8929 Other chronic pain: Secondary | ICD-10-CM

## 2019-07-18 DIAGNOSIS — M5441 Lumbago with sciatica, right side: Secondary | ICD-10-CM | POA: Diagnosis not present

## 2019-07-18 NOTE — Progress Notes (Signed)
Chief Complaint  Patient presents with  . Hip Pain    R/ hurting for the last couple of weeks   44 year old female rheumatoid arthritis on Actemra Celebrex with history of hyper both thyroidism and hypertension 5-year history of chronic intermittent right hip pain which became worse in March approaching 10 out of 10 pain trouble walking in the grocery store requiring a boggy difficulty going up and down the stairs and getting it below cars.  She did take some opioid pain medication her pain is currently 5 out of 10 complains of pain in the groin and in the right buttock near the ischium.  Review of Systems  Constitutional: Negative for weight loss.  Musculoskeletal: Positive for back pain and joint pain.  All other systems reviewed and are negative.   Past Medical History:  Diagnosis Date  . Chronic right lower quadrant pain   . DDD (degenerative disc disease), cervical    c6-7  . GERD (gastroesophageal reflux disease)    WATCHES DIET  . History of hypoglycemia   . History of kidney stones    passed spontaneously  . Hypertension   . Hypothyroidism   . OA (osteoarthritis)    knees , hips, feet  . PONV (postoperative nausea and vomiting)   . Rheumatoid arthritis(714.0) dx  2001  multiple sites   rheumotologist-  dr Molly Maduro gay w/ [facility]--- currently treated w/ oral arava and iv actemra  . SUI (stress urinary incontinence), female    Past Surgical History:  Procedure Laterality Date  . D & C HYSTERECTOMY /  MYOSURE RESECTION OF POLYP  2017     dr Marcelle Overlie  . KNEE ARTHROSCOPY Right 1999  . KNEE ARTHROSCOPY WITH MEDIAL MENISECTOMY Right 02/07/2018   Procedure: KNEE ARTHROSCOPY WITH PARTIAL LATERAL MENISECTOMY;  Surgeon: Vickki Hearing, MD;  Location: [facility];  Service: Orthopedics;  Laterality: Right;  . LAPAROSCOPY N/A 02/16/2017   Procedure: LAPAROSCOPY DIAGNOSTIC;  Surgeon: Richarda Overlie, MD;  Location: [facility];  Service: Gynecology;  Laterality: N/A;  .  WISDOM TOOTH EXTRACTION      BP (!) 131/97   Pulse 86   Ht 5' 6.5" (1.689 m)   Wt 219 lb 8 oz (99.6 kg)   BMI 34.90 kg/m   Jessica Pacheco has a cane but she has to use it in the right hand secondary to ulnar neuropathy in the left hand  She has 125 degrees of hip flexion with pain at terminal hip flexion she has mild discomfort with internal rotation in the groin and then she has lower back pain on the right with pain to palpation over the right ischium with no numbness or tingling and negative straight leg raises and otherwise neuro vascular function intact  Outside image shows a right hip arthritis with decreased joint space at the's or seal area and also inferiorly nearly grade 4 left hip looks normal  Recommend 1 month of physical therapy to try to address the lumbar pain the hip pain seems to be tolerable and function is somewhat compromised but not completely  Encounter Diagnoses  Name Primary?  . Right buttock pain   . Chronic right-sided low back pain with right-sided sciatica Yes

## 2019-07-18 NOTE — Patient Instructions (Signed)
You will get a call from [facility] regarding the physical therapy

## 2019-07-24 ENCOUNTER — Ambulatory Visit: Payer: Commercial Managed Care - PPO | Attending: Internal Medicine

## 2019-07-24 DIAGNOSIS — Z23 Encounter for immunization: Secondary | ICD-10-CM

## 2019-07-24 NOTE — Progress Notes (Signed)
   Covid-19 Vaccination Clinic  Name:  Jessica Pacheco    MRN: [id number] DOB: 1975-04-13  07/24/2019  [name] was observed post Covid-19 immunization for 15 minutes without incident. She was provided with Vaccine Information Sheet and instruction to access the V-Safe system.   [name] was instructed to call 911 with any severe reactions post vaccine: Marland Kitchen Difficulty breathing  . Swelling of face and throat  . A fast heartbeat  . A bad rash all over body  . Dizziness and weakness   Immunizations Administered    Name Date Dose VIS Date Route   Moderna COVID-19 Vaccine 07/24/2019  8:48 AM 0.5 mL 01/2019 Intramuscular   Manufacturer: Moderna   Lot: [id number]   NDC: 61612-240-01

## 2019-07-30 ENCOUNTER — Encounter (HOSPITAL_COMMUNITY): Payer: Self-pay | Admitting: Physical Therapy

## 2019-07-30 ENCOUNTER — Ambulatory Visit (HOSPITAL_COMMUNITY): Payer: Commercial Managed Care - PPO | Attending: Orthopedic Surgery | Admitting: Physical Therapy

## 2019-07-30 ENCOUNTER — Other Ambulatory Visit: Payer: Self-pay

## 2019-07-30 DIAGNOSIS — M6281 Muscle weakness (generalized): Secondary | ICD-10-CM

## 2019-07-30 DIAGNOSIS — R29898 Other symptoms and signs involving the musculoskeletal system: Secondary | ICD-10-CM

## 2019-07-30 DIAGNOSIS — M545 Low back pain, unspecified: Secondary | ICD-10-CM

## 2019-07-30 DIAGNOSIS — R2689 Other abnormalities of gait and mobility: Secondary | ICD-10-CM | POA: Diagnosis present

## 2019-07-30 NOTE — Therapy (Signed)
[facility] [address] [place], [place], [address] Phone: [phone]   Fax:  [phone]  Physical Therapy Evaluation  Patient Details  Name: Jessica Pacheco MRN: [id number] Date of Birth: 07-07-1975 Referring Provider (PT): Fuller Canada MD   Encounter Date: 07/30/2019  PT End of Session - 07/30/19 1203    Visit Number  1    Number of Visits  12    Date for PT Re-Evaluation  09/10/19    Authorization Type  [employer] (no auth 30 VL combined)    Authorization - Number of Visits  30    Progress Note Due on Visit  10    PT Start Time  1033    PT Stop Time  1116    PT Time Calculation (min)  43 min    Activity Tolerance  Patient tolerated treatment well;Patient limited by pain    Behavior During Therapy  [facility] for tasks assessed/performed       Past Medical History:  Diagnosis Date  . Chronic right lower quadrant pain   . DDD (degenerative disc disease), cervical    c6-7  . GERD (gastroesophageal reflux disease)    WATCHES DIET  . History of hypoglycemia   . History of kidney stones    passed spontaneously  . Hypertension   . Hypothyroidism   . OA (osteoarthritis)    knees , hips, feet  . PONV (postoperative nausea and vomiting)   . Rheumatoid arthritis(714.0) dx  2001  multiple sites   rheumotologist-  dr Molly Maduro gay w/ [facility]--- currently treated w/ oral arava and iv actemra  . SUI (stress urinary incontinence), female     Past Surgical History:  Procedure Laterality Date  . D & C HYSTERECTOMY /  MYOSURE RESECTION OF POLYP  2017     dr Marcelle Overlie  . KNEE ARTHROSCOPY Right 1999  . KNEE ARTHROSCOPY WITH MEDIAL MENISECTOMY Right 02/07/2018   Procedure: KNEE ARTHROSCOPY WITH PARTIAL LATERAL MENISECTOMY;  Surgeon: Vickki Hearing, MD;  Location: [facility];  Service: Orthopedics;  Laterality: Right;  . LAPAROSCOPY N/A 02/16/2017   Procedure: LAPAROSCOPY DIAGNOSTIC;  Surgeon: Richarda Overlie, MD;  [facility];  Service: Gynecology;  Laterality: N/A;  . WISDOM TOOTH EXTRACTION      There were no vitals filed for this visit.   Subjective Assessment - 07/30/19 1033    Subjective  Patient is a 44 y.o. female who presents to physical therapy with c/o low back pain and R hip symptoms. Patient states she is having R hip and back pain which has now progressed to L hip as well. Symptoms begin in March with insidious onset. She has history of RA and OA. She is not sure of aggravating factors besides stairs and walking. She states symptoms are not as bad with decreased walking. She has not found relieving factors. Patient states her main goal for PT is to decrease pain. Her hips are bone on bone and she will need hip replacement soon. She has history of back pain.    Limitations  Sitting;Walking;House hold activities    Patient Stated Goals  decrease pain    Currently in Pain?  Yes    Pain Score  6    worst 8/10   Pain Location  Back         [facility] PT Assessment - 07/30/19 0001      Assessment   Medical Diagnosis  LBP and bilateral hip pain  Referring Provider (PT)  Fuller Canada MD    Onset Date/Surgical Date  05/11/19    Next MD Visit  June 28    Prior Therapy  2000 for knee      Precautions   Precautions  None      Restrictions   Weight Bearing Restrictions  No      Balance Screen   Has the patient fallen in the past 6 months  No    Has the patient had a decrease in activity level because of a fear of falling?   No    Is the patient reluctant to leave their home because of a fear of falling?   No      Prior Function   Level of Independence  Independent    Vocation  On disability    Vocation Requirements  Radiology      Cognition   Overall Cognitive Status  Within Functional Limits for tasks assessed      Observation/Other Assessments   Observations  Ambulates with SPC, antalgic gait on RLE, SPC in R hand    Focus on Therapeutic Outcomes (FOTO)   56% limited       Sensation   Light Touch  Appears Intact      Posture/Postural Control   Posture/Postural Control  Postural limitations    Posture Comments  slouched in chair      ROM / Strength   AROM / PROM / Strength  AROM;Strength      AROM   AROM Assessment Site  Lumbar    Lumbar Flexion  25% limited    Lumbar Extension  25% limited pain in back    Lumbar - Right Side Bend  0% limited sore in back favors flexion    Lumbar - Left Side Bend  0% limited sore in back favors flexion    Lumbar - Right Rotation  0% limited    Lumbar - Left Rotation  0% limited      Strength   Strength Assessment Site  Hip;Knee;Ankle    Right/Left Hip  Right;Left    Right Hip Flexion  4/5    Right Hip Extension  4/5   pain in buttock    Right Hip ABduction  4/5    Left Hip Flexion  4/5    Left Hip Extension  4+/5    Left Hip ABduction  4/5    Right/Left Knee  Right;Left    Right Knee Flexion  5/5    Right Knee Extension  5/5    Left Knee Flexion  5/5    Left Knee Extension  5/5    Right/Left Ankle  Right;Left    Right Ankle Dorsiflexion  5/5    Left Ankle Dorsiflexion  5/5      Palpation   Spinal mobility  Hypomobile thoracic and lumbar, pain L4 and L5  concordant pain in back not, hips    Palpation comment  TTP R greater trochanter, R piformis, R glutes, lumbar paraspinals overactive and tender R>L, L piriformis tenderness, Decreased R glute med/min muscle mass       Transfers   Five time sit to stand comments   23.09 seconds    Comments  slow, labored without form deviations      Ambulation/Gait   Ambulation/Gait  Yes    Ambulation/Gait Assistance  6: Modified independent (Device/Increase time)    Ambulation Distance (Feet)  245 Feet    Assistive device  Straight cane    Gait Pattern  Step-through pattern;Lateral trunk lean to right;Poor foot clearance - left;Poor foot clearance - right;Wide base of support   bilateral LE held in ER   Ambulation Surface  Level;Indoor    Gait velocity  decreased      Gait Comments  2 MWT, slow, labored                  Objective measurements completed on examination: See above findings.              PT Education - 07/30/19 1041    Education Details  Patient educated on exam findings, POC, scope of PT, adjusting cane height properly, LBP, low back and hip contribution to symptoms, muscle weakness, imaging, continuing movement and ADL.    Person(s) Educated  Patient    Methods  Explanation;Demonstration    Comprehension  Verbalized understanding;Returned demonstration       PT Short Term Goals - 07/30/19 1209      PT SHORT TERM GOAL #1   Title  Patient will be independent with HEP in order to improve functional outcomes.    Time  3    Period  Weeks    Status  New    Target Date  08/20/19      PT SHORT TERM GOAL #2   Title  Patient will report at least 25% improvement in symptoms for improved quality of life.    Time  3    Period  Weeks    Status  New    Target Date  08/20/19        PT Long Term Goals - 07/30/19 1210      PT LONG TERM GOAL #1   Title  Patient will report at least 75% improvement in symptoms for improved quality of life.    Time  6    Period  Weeks    Status  New    Target Date  09/10/19      PT LONG TERM GOAL #2   Title  Patient will improve FOTO score by at least 5 points in order to indicate improved tolerance to activity.    Time  6    Period  Weeks    Status  New    Target Date  09/10/19      PT LONG TERM GOAL #3   Title  Patient will be able to complete 5x STS in under 11.4 seconds in order to reduce the risk of falls.    Time  6    Period  Weeks    Status  New    Target Date  09/10/19             Plan - 07/30/19 1205    Clinical Impression Statement  Patient is a 44 y.o. female who presents to physical therapy with c/o low back pain and bilateral hip symptoms. She presents with pain limited deficits in Lumbar and hip strength, ROM, endurance, postural impairments,  spinal mobility, gait, decreased muscle mass, overactive lumbar musculature, and functional mobility with ADL. She is having to modify and restrict ADL as indicated by FOTO score as well as subjective information and objective measures which is affecting overall participation. Patient will benefit from skilled physical therapy in order to improve function and reduce impairment.    Personal Factors and Comorbidities  Comorbidity 3+;Age;Fitness;Behavior Pattern;Past/Current Experience;Time since onset of injury/illness/exacerbation    Comorbidities  RA, OA, HTN, obesity, sedentary lifestyle    Examination-Activity Limitations  Bed Mobility;Bend;Caring for Others;Carry;Lift;Locomotion Level;Sit;Squat;Stairs;Stand;Transfers  Examination-Participation Restrictions  Cleaning;Community Activity;Yard Work;Volunteer;Shop;Meal Prep;Laundry    Stability/Clinical Decision Making  Evolving/Moderate complexity    Clinical Decision Making  Moderate    Rehab Potential  Fair    PT Frequency  2x / week    PT Duration  6 weeks    PT Treatment/Interventions  ADLs/Self Care Home Management;Aquatic Therapy;Biofeedback;Cryotherapy;Electrical Stimulation;Iontophoresis 4mg /ml Dexamethasone;Moist Heat;Ultrasound;Traction;DME Instruction;Gait training;Stair training;Functional mobility training;Therapeutic activities;Therapeutic exercise;Balance training;Neuromuscular re-education;Patient/family education;Orthotic Fit/Training;Manual techniques;Manual lymph drainage;Compression bandaging;Scar mobilization;Passive range of motion;Dry needling;Energy conservation;Splinting;Taping;Spinal Manipulations;Joint Manipulations    PT Next Visit Plan  initiate HEP, possibly begin manual for pain relief, begin core and hip strengthing as able with emphesis on extensors and abductors    PT Home Exercise Plan  initiate next session    Consulted and Agree with Plan of Care  Patient       Patient will benefit from skilled therapeutic  intervention in order to improve the following deficits and impairments:  Abnormal gait, Decreased activity tolerance, Decreased balance, Decreased mobility, Decreased knowledge of use of DME, Decreased range of motion, Decreased strength, Difficulty walking, Hypomobility, Impaired flexibility, Impaired perceived functional ability, Increased muscle spasms, Improper body mechanics, Postural dysfunction, Pain, Obesity  Visit Diagnosis: Low back pain, unspecified back pain laterality, unspecified chronicity, unspecified whether sciatica present  Muscle weakness (generalized)  Other abnormalities of gait and mobility  Other symptoms and signs involving the musculoskeletal system     Problem List Patient Active Problem List   Diagnosis Date Noted  . Hypertensive disorder 05/14/2019  . Hypothyroidism 05/14/2019  . Migraine 05/14/2019  . Ovarian endometriosis 05/14/2019  . S/P right knee arthroscopy 02/07/18 02/07/2018  . Meniscus, lateral, derangement, right   . Synovitis of right knee   . Arthritis of right knee   . Osteoarthritis, multiple sites 01/14/2018  . Rheumatoid arthritis of multiple sites with negative rheumatoid factor (HCC) 05/08/2016  . Primary osteoarthritis of both knees 08/30/2015  . Primary osteoarthritis of right hip 08/30/2015  . Primary osteoarthritis of left foot 06/30/2014  . Change in stool 07/27/2011  . Contraception 01/02/2011  . Rheumatoid arthritis(714.0) 02/21/1999    12:12 PM, 07/30/19 09/29/19 PT, DPT Physical Therapist at [facility]    [facility] [address] [place], [place], [place] Phone: [phone]   Fax:  [phone]  Name: Jessica Pacheco MRN: [name] Date of Birth: 09-15-1975

## 2019-07-31 ENCOUNTER — Ambulatory Visit (HOSPITAL_COMMUNITY): Payer: Commercial Managed Care - PPO

## 2019-07-31 ENCOUNTER — Encounter (HOSPITAL_COMMUNITY): Payer: Self-pay

## 2019-07-31 DIAGNOSIS — R29898 Other symptoms and signs involving the musculoskeletal system: Secondary | ICD-10-CM

## 2019-07-31 DIAGNOSIS — R2689 Other abnormalities of gait and mobility: Secondary | ICD-10-CM

## 2019-07-31 DIAGNOSIS — M6281 Muscle weakness (generalized): Secondary | ICD-10-CM

## 2019-07-31 DIAGNOSIS — M545 Low back pain, unspecified: Secondary | ICD-10-CM

## 2019-07-31 NOTE — Therapy (Signed)
[place], [place], [address] Phone: [phone]   Fax:  [phone]  Physical Therapy Treatment  Patient Details  Name: Jessica Pacheco MRN: [id number] Date of Birth: Jul 13, 1975 Referring Provider (PT): Arther Abbott MD   Encounter Date: 07/31/2019   PT End of Session - 07/31/19 1318    Visit Number 2    Number of Visits 12    Date for PT Re-Evaluation 09/10/19    Authorization Type [employer] (no auth 30 VL combined)    Authorization - Number of Visits 30    Progress Note Due on Visit 10    PT Start Time 0623    PT Stop Time 1355    PT Time Calculation (min) 42 min    Activity Tolerance Patient tolerated treatment well;No increased pain   Reports pain reduced to 3/10 at EOS   Behavior During Therapy [facility] for tasks assessed/performed           Past Medical History:  Diagnosis Date  . Chronic right lower quadrant pain   . DDD (degenerative disc disease), cervical    c6-7  . GERD (gastroesophageal reflux disease)    WATCHES DIET  . History of hypoglycemia   . History of kidney stones    passed spontaneously  . Hypertension   . Hypothyroidism   . OA (osteoarthritis)    knees , hips, feet  . PONV (postoperative nausea and vomiting)   . Rheumatoid arthritis(714.0) dx  2001  multiple sites   rheumotologist-  dr Herbie [place] gay w/ [facility]--- currently treated w/ oral arava and iv actemra  . SUI (stress urinary incontinence), female     Past Surgical History:  Procedure Laterality Date  . D & C HYSTERECTOMY /  MYOSURE RESECTION OF POLYP  2017     dr Matthew Saras  . KNEE ARTHROSCOPY Right 1999  . KNEE ARTHROSCOPY WITH MEDIAL MENISECTOMY Right 02/07/2018   Procedure: KNEE ARTHROSCOPY WITH PARTIAL LATERAL MENISECTOMY;  Surgeon: Carole Civil, MD;  Location: [facility];  Service: Orthopedics;  Laterality: Right;  . LAPAROSCOPY N/A 02/16/2017   Procedure: LAPAROSCOPY DIAGNOSTIC;  Surgeon: Molli Posey,  MD;  Location: [facility];  Service: Gynecology;  Laterality: N/A;  . WISDOM TOOTH EXTRACTION      There were no vitals filed for this visit.   Subjective Assessment - 07/31/19 1315    Subjective Patient stated she has intermittent LBP and Rt lateral sacral/gluteal, pain scale 5/10.    Patient Stated Goals decrease pain    Currently in Pain? Yes    Pain Score 5     Pain Location Back    Pain Orientation Lower;Right;Lateral    Pain Descriptors / Indicators Sore    Pain Type Acute pain    Pain Onset More than a month ago    Pain Frequency Intermittent    Aggravating Factors  walking, stairs    Pain Relieving Factors not sure, pain meds    Effect of Pain on Daily Activities moderate                             [facility] Adult PT Treatment/Exercise - 07/31/19 0001      Posture/Postural Control   Posture/Postural Control Postural limitations    Posture Comments slouched in chair      Exercises   Exercises Lumbar      Lumbar Exercises: Stretches   Single Knee to Chest  Stretch 2 reps;30 seconds    Lower Trunk Rotation 5 reps;10 seconds    Figure 4 Stretch 2 reps;30 seconds    Figure 4 Stretch Limitations supine     Other Lumbar Stretch Exercise butterfly stretch for groin pain 2x 30"      Lumbar Exercises: Supine   Ab Set 10 reps;3 seconds    AB Set Limitations paired with exhalation    Clam 10 reps;5 seconds    Clam Limitations RTB cueing for stabilty    Bridge 10 reps    Bridge Limitations with ab set                  PT Education - 07/31/19 1324    Education Details Reviewed goals, educated importance of HEP compliance for maximal benefits that was establisehd this session.    Person(s) Educated Patient    Methods Explanation;Demonstration;Verbal cues;Handout    Comprehension Verbalized understanding;Returned demonstration            PT Short Term Goals - 07/30/19 1209      PT SHORT TERM GOAL #1   Title Patient will be  independent with HEP in order to improve functional outcomes.    Time 3    Period Weeks    Status New    Target Date 08/20/19      PT SHORT TERM GOAL #2   Title Patient will report at least 25% improvement in symptoms for improved quality of life.    Time 3    Period Weeks    Status New    Target Date 08/20/19             PT Long Term Goals - 07/30/19 1210      PT LONG TERM GOAL #1   Title Patient will report at least 75% improvement in symptoms for improved quality of life.    Time 6    Period Weeks    Status New    Target Date 09/10/19      PT LONG TERM GOAL #2   Title Patient will improve FOTO score by at least 5 points in order to indicate improved tolerance to activity.    Time 6    Period Weeks    Status New    Target Date 09/10/19      PT LONG TERM GOAL #3   Title Patient will be able to complete 5x STS in under 11.4 seconds in order to reduce the risk of falls.    Time 6    Period Weeks    Status New    Target Date 09/10/19                 Plan - 07/31/19 1345    Clinical Impression Statement Reviewed goals, educated importance of HEP compliance and established exercise program this session.  Pt educated on importance of core and gluteal strengthening to reduce compensation with lumbar muscuature.  Some difficulty with core activation, encouraged to pair wth exhalation that improved mechanics.  Added stretches for lumbar and hip mobility.  EOS pt reports pain reduced to 3/10.  Pt given printout and folder with HEP.  Through session pt was limited by SOB with mask on face.  Fan in room assisted.    Personal Factors and Comorbidities Comorbidity 3+;Age;Fitness;Behavior Pattern;Past/Current Experience;Time since onset of injury/illness/exacerbation    Comorbidities RA, OA, HTN, obesity, sedentary lifestyle    Examination-Activity Limitations Bed Mobility;Bend;Caring for Others;Carry;Lift;Locomotion Level;Sit;Squat;Stairs;Stand;Transfers     Examination-Participation Restrictions Cleaning;Community Activity;Yard Work;Volunteer;Shop;Meal  Prep;Laundry    Stability/Clinical Decision Making Evolving/Moderate complexity    Clinical Decision Making Moderate    Rehab Potential Fair    PT Duration 6 weeks    PT Treatment/Interventions ADLs/Self Care Home Management;Aquatic Therapy;Biofeedback;Cryotherapy;Electrical Stimulation;Iontophoresis 4mg /ml Dexamethasone;Moist Heat;Ultrasound;Traction;DME Instruction;Gait training;Stair training;Functional mobility training;Therapeutic activities;Therapeutic exercise;Balance training;Neuromuscular re-education;Patient/family education;Orthotic Fit/Training;Manual techniques;Manual lymph drainage;Compression bandaging;Scar mobilization;Passive range of motion;Dry needling;Energy conservation;Splinting;Taping;Spinal Manipulations;Joint Manipulations    PT Next Visit Plan Review HEP compliance, possibly begin manual for pain relief, begin core and hip strengthing as able with emphesis on extensors and abductors    PT Home Exercise Plan 07/31/19: ab set, bridge, LTR, supine piriformis st           Patient will benefit from skilled therapeutic intervention in order to improve the following deficits and impairments:  Abnormal gait, Decreased activity tolerance, Decreased balance, Decreased mobility, Decreased knowledge of use of DME, Decreased range of motion, Decreased strength, Difficulty walking, Hypomobility, Impaired flexibility, Impaired perceived functional ability, Increased muscle spasms, Improper body mechanics, Postural dysfunction, Pain, Obesity  Visit Diagnosis: Low back pain, unspecified back pain laterality, unspecified chronicity, unspecified whether sciatica present  Muscle weakness (generalized)  Other abnormalities of gait and mobility  Other symptoms and signs involving the musculoskeletal system     Problem List Patient Active Problem List   Diagnosis Date Noted  . Hypertensive  disorder 05/14/2019  . Hypothyroidism 05/14/2019  . Migraine 05/14/2019  . Ovarian endometriosis 05/14/2019  . S/P right knee arthroscopy 02/07/18 02/07/2018  . Meniscus, lateral, derangement, right   . Synovitis of right knee   . Arthritis of right knee   . Osteoarthritis, multiple sites 01/14/2018  . Rheumatoid arthritis of multiple sites with negative rheumatoid factor (HCC) 05/08/2016  . Primary osteoarthritis of both knees 08/30/2015  . Primary osteoarthritis of right hip 08/30/2015  . Primary osteoarthritis of left foot 06/30/2014  . Change in stool 07/27/2011  . Contraception 01/02/2011  . Rheumatoid arthritis(714.0) 02/21/1999   04/21/1999, LPTA/CLT; CBIS [phone]  [phone] 07/31/2019, 2:09 PM  [place] [facility] [address] [place], [place], [place] Phone: [phone]   Fax:  [phone]  Name: ANJA NEUZIL MRN: [name] Date of Birth: [birth year]

## 2019-07-31 NOTE — Patient Instructions (Addendum)
Isometric Abdominal    Lying on back with knees bent. Inhale swelling belly then exhale as you tighten abdominal musculature.   Hold stomach tight for 5 seconds by counting outloud. Repeat 10 times per set. Do 2 sets per day.  http://orth.exer.us/1087   Copyright  VHI. All rights reserved.   Bridge    Lie back, legs bent. Inhale, pressing hips up. Keeping ribs in, lengthen lower back. Exhale, rolling down along spine from top. Repeat 10 times. Do 2 sessions per day.  http://pm.exer.us/55   Copyright  VHI. All rights reserved.   Lower Trunk Rotation Stretch    Keeping back flat and feet together, rotate knees to left side. Hold 10 seconds. Repeat 5 times per set. Do 2 sets per day.  http://orth.exer.us/123   Copyright  VHI. All rights reserved.    Piriformis Stretch, Supine    Lie supine, one ankle crossed onto opposite knee. Holding bottom leg behind knee, gently pull legs toward chest until stretch is felt in buttock of top leg. Hold 30 seconds. For deeper stretch gently push top knee away from body.  Repeat 3 times per session. Do 2 sessions per day.  Copyright  VHI. All rights reserved.

## 2019-08-05 ENCOUNTER — Other Ambulatory Visit: Payer: Self-pay

## 2019-08-05 ENCOUNTER — Ambulatory Visit (HOSPITAL_COMMUNITY): Payer: Commercial Managed Care - PPO | Admitting: Physical Therapy

## 2019-08-05 DIAGNOSIS — M545 Low back pain, unspecified: Secondary | ICD-10-CM

## 2019-08-05 DIAGNOSIS — M6281 Muscle weakness (generalized): Secondary | ICD-10-CM

## 2019-08-05 DIAGNOSIS — R2689 Other abnormalities of gait and mobility: Secondary | ICD-10-CM

## 2019-08-05 NOTE — Therapy (Signed)
[facility] [address] [place], [place], [address] Phone: [phone]   Fax:  [phone]  Physical Therapy Treatment  Patient Details  Name: Jessica Pacheco MRN: [id number] Date of Birth: [birth year] Referring Provider (PT): Fuller Canada MD   Encounter Date: 08/05/2019   PT End of Session - 08/05/19 1420    Visit Number 3    Number of Visits 12    Date for PT Re-Evaluation 09/10/19    Authorization Type [employer] (no auth 30 VL combined)    Authorization - Visit Number 3    Authorization - Number of Visits 30    Progress Note Due on Visit 10    PT Start Time 1400    PT Stop Time 1440    PT Time Calculation (min) 40 min    Activity Tolerance Patient tolerated treatment well;No increased pain   Reports pain reduced to 3/10 at EOS   Behavior During Therapy [facility] for tasks assessed/performed           Past Medical History:  Diagnosis Date  . Chronic right lower quadrant pain   . DDD (degenerative disc disease), cervical    c6-7  . GERD (gastroesophageal reflux disease)    WATCHES DIET  . History of hypoglycemia   . History of kidney stones    passed spontaneously  . Hypertension   . Hypothyroidism   . OA (osteoarthritis)    knees , hips, feet  . PONV (postoperative nausea and vomiting)   . Rheumatoid arthritis(714.0) dx  2001  multiple sites   rheumotologist-  dr Molly Maduro gay w/ [facility]--- currently treated w/ oral arava and iv actemra  . SUI (stress urinary incontinence), female     Past Surgical History:  Procedure Laterality Date  . D & C HYSTERECTOMY /  MYOSURE RESECTION OF POLYP  2017     dr Marcelle Overlie  . KNEE ARTHROSCOPY Right 1999  . KNEE ARTHROSCOPY WITH MEDIAL MENISECTOMY Right 02/07/2018   Procedure: KNEE ARTHROSCOPY WITH PARTIAL LATERAL MENISECTOMY;  Surgeon: Vickki Hearing, MD;  Location: [facility];  Service: Orthopedics;  Laterality: Right;  . LAPAROSCOPY N/A 02/16/2017   Procedure: LAPAROSCOPY  DIAGNOSTIC;  Surgeon: Richarda Overlie, MD;  Location: [facility];  Service: Gynecology;  Laterality: N/A;  . WISDOM TOOTH EXTRACTION      There were no vitals filed for this visit.   Subjective Assessment - 08/05/19 1406    Subjective States that her right hip is really bothering her going down her leg 4/10 and across her low back is 7/10. States pain in hip is now consistently sore, and across her back is a consistent ache. States that her exercises hurt during but afterwards she feels better. The worse exercise was the piriformis stretch and she had nausea with her TRA activation exercise.    Patient Stated Goals decrease pain    Pain Onset More than a month ago              [facility] PT Assessment - 08/05/19 0001      Assessment   Medical Diagnosis LBP and bilateral hip pain    Referring Provider (PT) Fuller Canada MD    Onset Date/Surgical Date 05/11/19                         [facility] Adult PT Treatment/Exercise - 08/05/19 0001      Lumbar Exercises: Stretches   Double Knee to Chest Stretch  Other (comment)   on green ball 3 minutes    Lower Trunk Rotation Other (comment)   on green ball, 4 minutes     Lumbar Exercises: Supine   Other Supine Lumbar Exercises diaphragmatic breathing - 6 minutes - PT tactile cues    Other Supine Lumbar Exercises butterfly stretch x6 5" holds timed with breathing.       Manual Therapy   Manual Therapy Manual Traction    Manual therapy comments performed independently of other interventions    Manual Traction PT performed lumbar traction on green ball - 6 minutes                  PT Education - 08/05/19 1437    Education Details Educated patient in intra-abdominal pressure and how breathing can change that with movement and with pain at rest.    Person(s) Educated Patient    Methods Explanation    Comprehension Verbalized understanding            PT Short Term Goals - 07/30/19 1209      PT SHORT  TERM GOAL #1   Title Patient will be independent with HEP in order to improve functional outcomes.    Time 3    Period Weeks    Status New    Target Date 08/20/19      PT SHORT TERM GOAL #2   Title Patient will report at least 25% improvement in symptoms for improved quality of life.    Time 3    Period Weeks    Status New    Target Date 08/20/19             PT Long Term Goals - 07/30/19 1210      PT LONG TERM GOAL #1   Title Patient will report at least 75% improvement in symptoms for improved quality of life.    Time 6    Period Weeks    Status New    Target Date 09/10/19      PT LONG TERM GOAL #2   Title Patient will improve FOTO score by at least 5 points in order to indicate improved tolerance to activity.    Time 6    Period Weeks    Status New    Target Date 09/10/19      PT LONG TERM GOAL #3   Title Patient will be able to complete 5x STS in under 11.4 seconds in order to reduce the risk of falls.    Time 6    Period Weeks    Status New    Target Date 09/10/19                 Plan - 08/05/19 1420    Clinical Impression Statement Patient tolerated session very well. Focused on unweight lumbar mobility and diaphragmatic breathing. Pain at 1/10 in low back and 2/10 in hip end of session. Added new exercises to HEP and patient interested in buying exercise ball for personal use at home. Will progress breathing exercises as tolerated.    Personal Factors and Comorbidities Comorbidity 3+;Age;Fitness;Behavior Pattern;Past/Current Experience;Time since onset of injury/illness/exacerbation    Comorbidities RA, OA, HTN, obesity, sedentary lifestyle    Examination-Activity Limitations Bed Mobility;Bend;Caring for Others;Carry;Lift;Locomotion Level;Sit;Squat;Stairs;Stand;Transfers    Examination-Participation Restrictions Cleaning;Community Activity;Yard Work;Volunteer;Shop;Meal Prep;Laundry    Stability/Clinical Decision Making Evolving/Moderate complexity     Rehab Potential Fair    PT Duration 6 weeks    PT Treatment/Interventions ADLs/Self Care Home Management;Aquatic Therapy;Biofeedback;Cryotherapy;Dealer  Stimulation;Iontophoresis 4mg /ml Dexamethasone;Moist Heat;Ultrasound;Traction;DME Instruction;Gait training;Stair training;Functional mobility training;Therapeutic activities;Therapeutic exercise;Balance training;Neuromuscular re-education;Patient/family education;Orthotic Fit/Training;Manual techniques;Manual lymph drainage;Compression bandaging;Scar mobilization;Passive range of motion;Dry needling;Energy conservation;Splinting;Taping;Spinal Manipulations;Joint Manipulations    PT Next Visit Plan Review HEP compliance, possibly begin manual for pain relief, begin core and hip strengthing as able with emphesis on extensors and abductors    PT Home Exercise Plan 07/31/19: ab set, bridge, LTR, supine piriformis st           Patient will benefit from skilled therapeutic intervention in order to improve the following deficits and impairments:  Abnormal gait, Decreased activity tolerance, Decreased balance, Decreased mobility, Decreased knowledge of use of DME, Decreased range of motion, Decreased strength, Difficulty walking, Hypomobility, Impaired flexibility, Impaired perceived functional ability, Increased muscle spasms, Improper body mechanics, Postural dysfunction, Pain, Obesity  Visit Diagnosis: Low back pain, unspecified back pain laterality, unspecified chronicity, unspecified whether sciatica present  Muscle weakness (generalized)  Other abnormalities of gait and mobility     Problem List Patient Active Problem List   Diagnosis Date Noted  . Hypertensive disorder 05/14/2019  . Hypothyroidism 05/14/2019  . Migraine 05/14/2019  . Ovarian endometriosis 05/14/2019  . S/P right knee arthroscopy 02/07/18 02/07/2018  . Meniscus, lateral, derangement, right   . Synovitis of right knee   . Arthritis of right knee   . Osteoarthritis,  multiple sites 01/14/2018  . Rheumatoid arthritis of multiple sites with negative rheumatoid factor (HCC) 05/08/2016  . Primary osteoarthritis of both knees 08/30/2015  . Primary osteoarthritis of right hip 08/30/2015  . Primary osteoarthritis of left foot 06/30/2014  . Change in stool 07/27/2011  . Contraception 01/02/2011  . Rheumatoid arthritis(714.0) 02/21/1999   3:49 PM, 08/05/19 08/07/19, DPT Physical Therapy with [facility]  [phone] office  [facility] [address] [place], [place], [place] Phone: [phone]   Fax:  [phone]  Name: KYTZIA GIENGER MRN: [name] Date of Birth: 14-Mar-1975

## 2019-08-07 ENCOUNTER — Encounter (HOSPITAL_COMMUNITY): Payer: Self-pay | Admitting: Physical Therapy

## 2019-08-07 ENCOUNTER — Other Ambulatory Visit: Payer: Self-pay

## 2019-08-07 ENCOUNTER — Ambulatory Visit (HOSPITAL_COMMUNITY): Payer: Commercial Managed Care - PPO | Admitting: Physical Therapy

## 2019-08-07 DIAGNOSIS — R2689 Other abnormalities of gait and mobility: Secondary | ICD-10-CM

## 2019-08-07 DIAGNOSIS — M545 Low back pain, unspecified: Secondary | ICD-10-CM

## 2019-08-07 DIAGNOSIS — R29898 Other symptoms and signs involving the musculoskeletal system: Secondary | ICD-10-CM

## 2019-08-07 DIAGNOSIS — M6281 Muscle weakness (generalized): Secondary | ICD-10-CM

## 2019-08-07 NOTE — Therapy (Signed)
[facility] [address] [place], [place], [address] Phone: [phone]   Fax:  [phone]  Physical Therapy Treatment  Patient Details  Name: Jessica Pacheco MRN: [id number] Date of Birth: 1975/07/08 Referring Provider (PT): Fuller Canada MD   Encounter Date: 08/07/2019   PT End of Session - 08/07/19 1127    Visit Number 4    Number of Visits 12    Date for PT Re-Evaluation 09/10/19    Authorization Type [employer] (no auth 30 VL combined)    Authorization - Visit Number 4    Authorization - Number of Visits 30    Progress Note Due on Visit 10    PT Start Time 1117    PT Stop Time 1158    PT Time Calculation (min) 41 min    Activity Tolerance Patient tolerated treatment well;No increased pain    Behavior During Therapy WFL for tasks assessed/performed           Past Medical History:  Diagnosis Date  . Chronic right lower quadrant pain   . DDD (degenerative disc disease), cervical    c6-7  . GERD (gastroesophageal reflux disease)    WATCHES DIET  . History of hypoglycemia   . History of kidney stones    passed spontaneously  . Hypertension   . Hypothyroidism   . OA (osteoarthritis)    knees , hips, feet  . PONV (postoperative nausea and vomiting)   . Rheumatoid arthritis(714.0) dx  2001  multiple sites   rheumotologist-  dr Molly Maduro gay w/ [facility]--- currently treated w/ oral arava and iv actemra  . SUI (stress urinary incontinence), female     Past Surgical History:  Procedure Laterality Date  . D & C HYSTERECTOMY /  MYOSURE RESECTION OF POLYP  2017     dr Marcelle Overlie  . KNEE ARTHROSCOPY Right 1999  . KNEE ARTHROSCOPY WITH MEDIAL MENISECTOMY Right 02/07/2018   Procedure: KNEE ARTHROSCOPY WITH PARTIAL LATERAL MENISECTOMY;  Surgeon: Vickki Hearing, MD;  Location: [facility];  Service: Orthopedics;  Laterality: Right;  . LAPAROSCOPY N/A 02/16/2017   Procedure: LAPAROSCOPY DIAGNOSTIC;  Surgeon: Richarda Overlie, MD;   Location: [facility];  Service: Gynecology;  Laterality: N/A;  . WISDOM TOOTH EXTRACTION      There were no vitals filed for this visit.   Subjective Assessment - 08/07/19 1113    Subjective Patient states her hip today is a 1-2/10 max and back is about a 5/10. Mostly stiffness in aching in back. Bending makes things worse. Some strengthen exercises were making her nauseous. She went and got a fitness ball because those exercise were helpful. She frequently does exercises and they help.    Patient Stated Goals decrease pain    Currently in Pain? Yes    Pain Score 5    stiff   Pain Location Hand    Pain Onset More than a month ago                             [facility] Adult PT Treatment/Exercise - 08/07/19 0001      Lumbar Exercises: Stretches   Double Knee to Chest Stretch Other (comment)   3 minutes on green ball   Lower Trunk Rotation Other (comment)   3 minutes on green ball   Prone on Elbows Stretch 30 seconds;5 reps    Press Ups 10 reps    Press Ups Limitations  3 second holds, 2 sets      Lumbar Exercises: Supine   Other Supine Lumbar Exercises iso hip add/abd 5x5 second holds      Lumbar Exercises: Quadruped   Other Quadruped Lumbar Exercises prone heel squeeze 10 x 10 second holds                  PT Education - 08/07/19 1121    Education Details Patient educated on HEP, mechanics of exercises, dosing of ext exercises    Person(s) Educated Patient    Methods Explanation;Demonstration;Handout    Comprehension Verbalized understanding;Returned demonstration            PT Short Term Goals - 07/30/19 1209      PT SHORT TERM GOAL #1   Title Patient will be independent with HEP in order to improve functional outcomes.    Time 3    Period Weeks    Status New    Target Date 08/20/19      PT SHORT TERM GOAL #2   Title Patient will report at least 25% improvement in symptoms for improved quality of life.    Time 3    Period  Weeks    Status New    Target Date 08/20/19             PT Long Term Goals - 07/30/19 1210      PT LONG TERM GOAL #1   Title Patient will report at least 75% improvement in symptoms for improved quality of life.    Time 6    Period Weeks    Status New    Target Date 09/10/19      PT LONG TERM GOAL #2   Title Patient will improve FOTO score by at least 5 points in order to indicate improved tolerance to activity.    Time 6    Period Weeks    Status New    Target Date 09/10/19      PT LONG TERM GOAL #3   Title Patient will be able to complete 5x STS in under 11.4 seconds in order to reduce the risk of falls.    Time 6    Period Weeks    Status New    Target Date 09/10/19                 Plan - 08/07/19 1128    Clinical Impression Statement Patient tolerates POE with decrease in low back symptoms during and following. She requires verbal cueing and demonstration for press up and is able to complete with good mechanics following. Upon completing first set of press ups symptoms in low back decrease to 2/10 with no hip symptoms. She has 0/10 pain and stiffness following second set of press ups. Stiffness begins to increase minimally after several minutes seated.  She is able to complete previously done exercises with good mechanics and no cueing. Patient will continue to benefit from skilled physical therapy in order to improve function and reduce impairment.    Personal Factors and Comorbidities Comorbidity 3+;Age;Fitness;Behavior Pattern;Past/Current Experience;Time since onset of injury/illness/exacerbation    Comorbidities RA, OA, HTN, obesity, sedentary lifestyle    Examination-Activity Limitations Bed Mobility;Bend;Caring for Others;Carry;Lift;Locomotion Level;Sit;Squat;Stairs;Stand;Transfers    Examination-Participation Restrictions Cleaning;Community Activity;Yard Work;Volunteer;Shop;Meal Prep;Laundry    Stability/Clinical Decision Making Evolving/Moderate complexity     Rehab Potential Fair    PT Frequency 2x / week    PT Duration 6 weeks    PT Treatment/Interventions ADLs/Self Care Home Management;Aquatic Therapy;Biofeedback;Cryotherapy;Dealer  Stimulation;Iontophoresis 4mg /ml Dexamethasone;Moist Heat;Ultrasound;Traction;DME Instruction;Gait training;Stair training;Functional mobility training;Therapeutic activities;Therapeutic exercise;Balance training;Neuromuscular re-education;Patient/family education;Orthotic Fit/Training;Manual techniques;Manual lymph drainage;Compression bandaging;Scar mobilization;Passive range of motion;Dry needling;Energy conservation;Splinting;Taping;Spinal Manipulations;Joint Manipulations    PT Next Visit Plan Review HEP compliance, possibly begin manual for pain relief, begin core and hip strengthing as able with emphesis on extensors and abductors; assess response to ext exercises    PT Home Exercise Plan 07/31/19: ab set, bridge, LTR, supine piriformis st 6/17 POE, press up           Patient will benefit from skilled therapeutic intervention in order to improve the following deficits and impairments:  Abnormal gait, Decreased activity tolerance, Decreased balance, Decreased mobility, Decreased knowledge of use of DME, Decreased range of motion, Decreased strength, Difficulty walking, Hypomobility, Impaired flexibility, Impaired perceived functional ability, Increased muscle spasms, Improper body mechanics, Postural dysfunction, Pain, Obesity  Visit Diagnosis: Low back pain, unspecified back pain laterality, unspecified chronicity, unspecified whether sciatica present  Muscle weakness (generalized)  Other abnormalities of gait and mobility  Other symptoms and signs involving the musculoskeletal system     Problem List Patient Active Problem List   Diagnosis Date Noted  . Hypertensive disorder 05/14/2019  . Hypothyroidism 05/14/2019  . Migraine 05/14/2019  . Ovarian endometriosis 05/14/2019  . S/P right knee  arthroscopy 02/07/18 02/07/2018  . Meniscus, lateral, derangement, right   . Synovitis of right knee   . Arthritis of right knee   . Osteoarthritis, multiple sites 01/14/2018  . Rheumatoid arthritis of multiple sites with negative rheumatoid factor (HCC) 05/08/2016  . Primary osteoarthritis of both knees 08/30/2015  . Primary osteoarthritis of right hip 08/30/2015  . Primary osteoarthritis of left foot 06/30/2014  . Change in stool 07/27/2011  . Contraception 01/02/2011  . Rheumatoid arthritis(714.0) 02/21/1999    12:03 PM, 08/07/19 08/09/19 PT, DPT Physical Therapist at [facility]  [place] [facility] [address] [place], [place], [place] Phone: [phone]   Fax:  [phone]  Name: ARSHI DUARTE MRN: [name] Date of Birth: 1976-01-18

## 2019-08-07 NOTE — Patient Instructions (Signed)
Access Code: [email or web address] URL: [email or web address] Date: 08/07/2019 Prepared by: [facility] Kriti Katayama  Exercises [employer] Up on Elbows - 5 x daily - 7 x weekly - 1 sets - 5 reps - 30 seconds hold Prone Press Up - 5 x daily - 7 x weekly - 2 sets - 10 reps

## 2019-08-11 ENCOUNTER — Encounter (HOSPITAL_COMMUNITY): Payer: Self-pay | Admitting: Physical Therapy

## 2019-08-11 ENCOUNTER — Ambulatory Visit (HOSPITAL_COMMUNITY): Payer: Commercial Managed Care - PPO | Admitting: Physical Therapy

## 2019-08-11 ENCOUNTER — Other Ambulatory Visit: Payer: Self-pay

## 2019-08-11 DIAGNOSIS — R29898 Other symptoms and signs involving the musculoskeletal system: Secondary | ICD-10-CM

## 2019-08-11 DIAGNOSIS — M545 Low back pain, unspecified: Secondary | ICD-10-CM

## 2019-08-11 DIAGNOSIS — M6281 Muscle weakness (generalized): Secondary | ICD-10-CM

## 2019-08-11 DIAGNOSIS — R2689 Other abnormalities of gait and mobility: Secondary | ICD-10-CM

## 2019-08-11 NOTE — Therapy (Signed)
[place], [place], [address] Phone: [phone]   Fax:  [phone]  Physical Therapy Treatment  Patient Details  Name: Jessica Pacheco MRN: [id number] Date of Birth: Aug 11, 1975 Referring Provider (PT): Arther Abbott MD   Encounter Date: 08/11/2019   PT End of Session - 08/11/19 1257    Visit Number 5    Number of Visits 12    Date for PT Re-Evaluation 09/10/19    Authorization Type [employer] (no auth 30 VL combined)    Authorization - Visit Number 5    Authorization - Number of Visits 30    Progress Note Due on Visit 10    PT Start Time 1300    PT Stop Time 1340    PT Time Calculation (min) 40 min    Activity Tolerance Patient tolerated treatment well;No increased pain    Behavior During Therapy WFL for tasks assessed/performed           Past Medical History:  Diagnosis Date  . Chronic right lower quadrant pain   . DDD (degenerative disc disease), cervical    c6-7  . GERD (gastroesophageal reflux disease)    WATCHES DIET  . History of hypoglycemia   . History of kidney stones    passed spontaneously  . Hypertension   . Hypothyroidism   . OA (osteoarthritis)    knees , hips, feet  . PONV (postoperative nausea and vomiting)   . Rheumatoid arthritis(714.0) dx  2001  multiple sites   rheumotologist-  dr Herbie [place] gay w/ [facility]--- currently treated w/ oral arava and iv actemra  . SUI (stress urinary incontinence), female     Past Surgical History:  Procedure Laterality Date  . D & C HYSTERECTOMY /  MYOSURE RESECTION OF POLYP  2017     dr Matthew Saras  . KNEE ARTHROSCOPY Right 1999  . KNEE ARTHROSCOPY WITH MEDIAL MENISECTOMY Right 02/07/2018   Procedure: KNEE ARTHROSCOPY WITH PARTIAL LATERAL MENISECTOMY;  Surgeon: Carole Civil, MD;  Location: [facility];  Service: Orthopedics;  Laterality: Right;  . LAPAROSCOPY N/A 02/16/2017   Procedure: LAPAROSCOPY DIAGNOSTIC;  Surgeon: Molli Posey, MD;   Location: [facility];  Service: Gynecology;  Laterality: N/A;  . WISDOM TOOTH EXTRACTION      There were no vitals filed for this visit.   Subjective Assessment - 08/11/19 1256    Subjective Patient states she was doing good until she was cleaning this morning. Her arms are tired and sore from press ups. She is curious about sleeping positions and ways to complete extension exercises. pain is about 5 in her low back, 3/10 in her hip.    Patient Stated Goals decrease pain    Currently in Pain? Yes    Pain Score 5     Pain Onset More than a month ago              [facility] PT Assessment - 08/11/19 0001      Assessment   Medical Diagnosis LBP and bilateral hip pain    Referring Provider (PT) Arther Abbott MD    Onset Date/Surgical Date 05/11/19      Observation/Other Assessments   Focus on Therapeutic Outcomes (FOTO)  57% limited                         [facility] Adult PT Treatment/Exercise - 08/11/19 0001      Lumbar Exercises: Stretches   Standing  Extension 10 reps;5 seconds    Standing Extension Limitations 2x10 without UE support, 2x10 at wall    Prone on Elbows Stretch 30 seconds;5 reps    Prone on Elbows Stretch Limitations with HoB elevated    Press Ups 10 reps    Press Ups Limitations 3 second holds, 2 sets      Lumbar Exercises: Standing   Other Standing Lumbar Exercises palof press green band 2x10 bilateral                  PT Education - 08/11/19 1257    Education Details Patient educated on HEP, mechanics of exercises    Person(s) Educated Patient    Methods Explanation;Demonstration    Comprehension Verbalized understanding;Returned demonstration            PT Short Term Goals - 07/30/19 1209      PT SHORT TERM GOAL #1   Title Patient will be independent with HEP in order to improve functional outcomes.    Time 3    Period Weeks    Status New    Target Date 08/20/19      PT SHORT TERM GOAL #2   Title Patient  will report at least 25% improvement in symptoms for improved quality of life.    Time 3    Period Weeks    Status New    Target Date 08/20/19             PT Long Term Goals - 07/30/19 1210      PT LONG TERM GOAL #1   Title Patient will report at least 75% improvement in symptoms for improved quality of life.    Time 6    Period Weeks    Status New    Target Date 09/10/19      PT LONG TERM GOAL #2   Title Patient will improve FOTO score by at least 5 points in order to indicate improved tolerance to activity.    Time 6    Period Weeks    Status New    Target Date 09/10/19      PT LONG TERM GOAL #3   Title Patient will be able to complete 5x STS in under 11.4 seconds in order to reduce the risk of falls.    Time 6    Period Weeks    Status New    Target Date 09/10/19                 Plan - 08/11/19 1257    Clinical Impression Statement Discussed sleeping positions and ways to complete press up without so much UE effort. Patient educated on completing press ups before getting out of bed to help with stiffness. She is also educated on propping up on pillows to assist with press up mechanics. She tolerates standing extension exercises unsupported and at wall with decrease in symptoms following. Patient educated not compensating or being too intense with extension exercises overpressure. Patient able to complete paloff exercise with min verbal cueing for mechanics and min verbal cueing for core activation. Patient will benefit from skilled physical therapy in order to improve function and reduce impairment.    Personal Factors and Comorbidities Comorbidity 3+;Age;Fitness;Behavior Pattern;Past/Current Experience;Time since onset of injury/illness/exacerbation    Comorbidities RA, OA, HTN, obesity, sedentary lifestyle    Examination-Activity Limitations Bed Mobility;Bend;Caring for Others;Carry;Lift;Locomotion Level;Sit;Squat;Stairs;Stand;Transfers     Examination-Participation Restrictions Cleaning;Community Activity;Yard Work;Volunteer;Shop;Meal Prep;Laundry    Stability/Clinical Decision Making Evolving/Moderate complexity  Rehab Potential Fair    PT Frequency 2x / week    PT Duration 6 weeks    PT Treatment/Interventions ADLs/Self Care Home Management;Aquatic Therapy;Biofeedback;Cryotherapy;Electrical Stimulation;Iontophoresis 4mg /ml Dexamethasone;Moist Heat;Ultrasound;Traction;DME Instruction;Gait training;Stair training;Functional mobility training;Therapeutic activities;Therapeutic exercise;Balance training;Neuromuscular re-education;Patient/family education;Orthotic Fit/Training;Manual techniques;Manual lymph drainage;Compression bandaging;Scar mobilization;Passive range of motion;Dry needling;Energy conservation;Splinting;Taping;Spinal Manipulations;Joint Manipulations    PT Next Visit Plan Review HEP compliance, possibly begin manual for pain relief, begin core and hip strengthing as able with emphesis on extensors and abductors; assess response to ext exercises    PT Home Exercise Plan 07/31/19: ab set, bridge, LTR, supine piriformis st 6/17 POE, press up           Patient will benefit from skilled therapeutic intervention in order to improve the following deficits and impairments:  Abnormal gait, Decreased activity tolerance, Decreased balance, Decreased mobility, Decreased knowledge of use of DME, Decreased range of motion, Decreased strength, Difficulty walking, Hypomobility, Impaired flexibility, Impaired perceived functional ability, Increased muscle spasms, Improper body mechanics, Postural dysfunction, Pain, Obesity  Visit Diagnosis: Low back pain, unspecified back pain laterality, unspecified chronicity, unspecified whether sciatica present  Muscle weakness (generalized)  Other abnormalities of gait and mobility  Other symptoms and signs involving the musculoskeletal system     Problem List Patient Active Problem  List   Diagnosis Date Noted  . Hypertensive disorder 05/14/2019  . Hypothyroidism 05/14/2019  . Migraine 05/14/2019  . Ovarian endometriosis 05/14/2019  . S/P right knee arthroscopy 02/07/18 02/07/2018  . Meniscus, lateral, derangement, right   . Synovitis of right knee   . Arthritis of right knee   . Osteoarthritis, multiple sites 01/14/2018  . Rheumatoid arthritis of multiple sites with negative rheumatoid factor (HCC) 05/08/2016  . Primary osteoarthritis of both knees 08/30/2015  . Primary osteoarthritis of right hip 08/30/2015  . Primary osteoarthritis of left foot 06/30/2014  . Change in stool 07/27/2011  . Contraception 01/02/2011  . Rheumatoid arthritis(714.0) 02/21/1999    1:47 PM, 08/11/19 08/13/19 PT, DPT Physical Therapist at [facility]  [facility] [address] [place], [place], [place] Phone: [phone]   Fax:  [phone]  Name: Jessica Pacheco MRN: [name] Date of Birth: 1975/02/28

## 2019-08-14 ENCOUNTER — Ambulatory Visit (HOSPITAL_COMMUNITY): Payer: Commercial Managed Care - PPO | Admitting: Physical Therapy

## 2019-08-14 ENCOUNTER — Encounter (HOSPITAL_COMMUNITY): Payer: Self-pay | Admitting: Physical Therapy

## 2019-08-14 ENCOUNTER — Other Ambulatory Visit: Payer: Self-pay

## 2019-08-14 DIAGNOSIS — R29898 Other symptoms and signs involving the musculoskeletal system: Secondary | ICD-10-CM

## 2019-08-14 DIAGNOSIS — R2689 Other abnormalities of gait and mobility: Secondary | ICD-10-CM

## 2019-08-14 DIAGNOSIS — M545 Low back pain, unspecified: Secondary | ICD-10-CM

## 2019-08-14 DIAGNOSIS — M6281 Muscle weakness (generalized): Secondary | ICD-10-CM

## 2019-08-14 NOTE — Therapy (Signed)
[facility] [address] [place], [place], [address] Phone: [phone]   Fax:  [phone]  Physical Therapy Treatment  Patient Details  Name: Jessica Pacheco MRN: [id number] Date of Birth: 09/25/1975 Referring Provider (PT): Fuller Canada MD   Encounter Date: 08/14/2019   PT End of Session - 08/14/19 1312    Visit Number 6    Number of Visits 12    Date for PT Re-Evaluation 09/10/19    Authorization Type [employer] (no auth 30 VL combined)    Authorization - Visit Number 6    Authorization - Number of Visits 30    Progress Note Due on Visit 10    PT Start Time 1307    PT Stop Time 1345    PT Time Calculation (min) 38 min    Activity Tolerance Patient tolerated treatment well;No increased pain    Behavior During Therapy WFL for tasks assessed/performed           Past Medical History:  Diagnosis Date  . Chronic right lower quadrant pain   . DDD (degenerative disc disease), cervical    c6-7  . GERD (gastroesophageal reflux disease)    WATCHES DIET  . History of hypoglycemia   . History of kidney stones    passed spontaneously  . Hypertension   . Hypothyroidism   . OA (osteoarthritis)    knees , hips, feet  . PONV (postoperative nausea and vomiting)   . Rheumatoid arthritis(714.0) dx  2001  multiple sites   rheumotologist-  dr Molly Maduro gay w/ [facility]--- currently treated w/ oral arava and iv actemra  . SUI (stress urinary incontinence), female     Past Surgical History:  Procedure Laterality Date  . D & C HYSTERECTOMY /  MYOSURE RESECTION OF POLYP  2017     dr Marcelle Overlie  . KNEE ARTHROSCOPY Right 1999  . KNEE ARTHROSCOPY WITH MEDIAL MENISECTOMY Right 02/07/2018   Procedure: KNEE ARTHROSCOPY WITH PARTIAL LATERAL MENISECTOMY;  Surgeon: Vickki Hearing, MD;  Location: [facility];  Service: Orthopedics;  Laterality: Right;  . LAPAROSCOPY N/A 02/16/2017   Procedure: LAPAROSCOPY DIAGNOSTIC;  Surgeon: Richarda Overlie, MD;   Location: [facility];  Service: Gynecology;  Laterality: N/A;  . WISDOM TOOTH EXTRACTION      There were no vitals filed for this visit.   Subjective Assessment - 08/14/19 1307    Subjective Patient states that Tuesday she was in a lot of pain and nothing she did would help. Yesterday she was good with everything. Today is was stiff when she first got up and she did the standing extensions. She was stressed on the way here and that started bothering her back and hip.    Patient Stated Goals decrease pain    Currently in Pain? Yes    Pain Score 4     Pain Location Back    Pain Onset More than a month ago                             [facility] Adult PT Treatment/Exercise - 08/14/19 0001      Lumbar Exercises: Standing   Row 10 reps    Theraband Level (Row) Level 3 (Green)    Row Limitations 2 sets    Shoulder Extension 10 reps    Theraband Level (Shoulder Extension) Level 3 (Green)    Shoulder Extension Limitations 2 sets    Other Standing Lumbar  Exercises palof press green band 2x10 bilateral      Lumbar Exercises: Seated   Other Seated Lumbar Exercises seated ab sets on yoga ball 10x 5 second holds, yoga ball heel lifts 2x10 bilateral    Other Seated Lumbar Exercises seated abdominal contraction with ball squeeze (flexion isometrics) 10x 10 seconds      Lumbar Exercises: Supine   Ab Set 10 reps;3 seconds    AB Set Limitations paired with exhalation    Bent Knee Raise 10 reps    Bent Knee Raise Limitations 3 sets with ab set                  PT Education - 08/14/19 1312    Education Details Patient educated on HEP, mechanics of exercises    Person(s) Educated Patient    Methods Explanation;Demonstration    Comprehension Verbalized understanding;Returned demonstration            PT Short Term Goals - 07/30/19 1209      PT SHORT TERM GOAL #1   Title Patient will be independent with HEP in order to improve functional outcomes.     Time 3    Period Weeks    Status New    Target Date 08/20/19      PT SHORT TERM GOAL #2   Title Patient will report at least 25% improvement in symptoms for improved quality of life.    Time 3    Period Weeks    Status New    Target Date 08/20/19             PT Long Term Goals - 07/30/19 1210      PT LONG TERM GOAL #1   Title Patient will report at least 75% improvement in symptoms for improved quality of life.    Time 6    Period Weeks    Status New    Target Date 09/10/19      PT LONG TERM GOAL #2   Title Patient will improve FOTO score by at least 5 points in order to indicate improved tolerance to activity.    Time 6    Period Weeks    Status New    Target Date 09/10/19      PT LONG TERM GOAL #3   Title Patient will be able to complete 5x STS in under 11.4 seconds in order to reduce the risk of falls.    Time 6    Period Weeks    Status New    Target Date 09/10/19                 Plan - 08/14/19 1313    Clinical Impression Statement Reviewed HEP with patient and she was performing palof press with core rotation instead of stable core. Patient performs with good mechanics and core activation following cueing. Patient able to complete rows and extensions for postural strengthening with core activation. Patient able to perform seated abdominal contractions on fitness ball and is able to perform heel raises while seated on it with good posture and ab set. She is unable to complete marches secondary to impaired seated balance. Abdominals are fatigued at end of session. Patient will continue to benefit from skilled physical therapy in order to reduce impairment and improve function.    Personal Factors and Comorbidities Comorbidity 3+;Age;Fitness;Behavior Pattern;Past/Current Experience;Time since onset of injury/illness/exacerbation    Comorbidities RA, OA, HTN, obesity, sedentary lifestyle    Examination-Activity Limitations Bed Mobility;Bend;Caring for  Others;Carry;Lift;Locomotion  Level;Sit;Squat;Stairs;Stand;Transfers    Examination-Participation Restrictions Cleaning;Community Activity;Yard Work;Volunteer;Shop;Meal Prep;Laundry    Stability/Clinical Decision Making Evolving/Moderate complexity    Rehab Potential Fair    PT Frequency 2x / week    PT Duration 6 weeks    PT Treatment/Interventions ADLs/Self Care Home Management;Aquatic Therapy;Biofeedback;Cryotherapy;Electrical Stimulation;Iontophoresis 4mg /ml Dexamethasone;Moist Heat;Ultrasound;Traction;DME Instruction;Gait training;Stair training;Functional mobility training;Therapeutic activities;Therapeutic exercise;Balance training;Neuromuscular re-education;Patient/family education;Orthotic Fit/Training;Manual techniques;Manual lymph drainage;Compression bandaging;Scar mobilization;Passive range of motion;Dry needling;Energy conservation;Splinting;Taping;Spinal Manipulations;Joint Manipulations    PT Next Visit Plan Review HEP compliance, possibly begin manual for pain relief, begin core and hip strengthing as able with emphesis on extensors and abductors; assess response to ext exercises    PT Home Exercise Plan 07/31/19: ab set, bridge, LTR, supine piriformis st 6/17 POE, press up 6/24 seated ball marches, ab crunch with swiss ball iso           Patient will benefit from skilled therapeutic intervention in order to improve the following deficits and impairments:  Abnormal gait, Decreased activity tolerance, Decreased balance, Decreased mobility, Decreased knowledge of use of DME, Decreased range of motion, Decreased strength, Difficulty walking, Hypomobility, Impaired flexibility, Impaired perceived functional ability, Increased muscle spasms, Improper body mechanics, Postural dysfunction, Pain, Obesity  Visit Diagnosis: Low back pain, unspecified back pain laterality, unspecified chronicity, unspecified whether sciatica present  Muscle weakness (generalized)  Other abnormalities of gait  and mobility  Other symptoms and signs involving the musculoskeletal system     Problem List Patient Active Problem List   Diagnosis Date Noted  . Hypertensive disorder 05/14/2019  . Hypothyroidism 05/14/2019  . Migraine 05/14/2019  . Ovarian endometriosis 05/14/2019  . S/P right knee arthroscopy 02/07/18 02/07/2018  . Meniscus, lateral, derangement, right   . Synovitis of right knee   . Arthritis of right knee   . Osteoarthritis, multiple sites 01/14/2018  . Rheumatoid arthritis of multiple sites with negative rheumatoid factor (HCC) 05/08/2016  . Primary osteoarthritis of both knees 08/30/2015  . Primary osteoarthritis of right hip 08/30/2015  . Primary osteoarthritis of left foot 06/30/2014  . Change in stool 07/27/2011  . Contraception 01/02/2011  . Rheumatoid arthritis(714.0) 02/21/1999    1:47 PM, 08/14/19 08/16/19 PT, DPT Physical Therapist at [facility]   [facility] [address] [place], [place], [place] Phone: [phone]   Fax:  [phone]  Name: Jessica Pacheco MRN: [name] Date of Birth: 1976-02-07

## 2019-08-14 NOTE — Patient Instructions (Signed)
Access Code: [email or web address] URL: [email or web address] Date: 08/14/2019 Prepared by: [facility] Fidela Cieslak  Exercises Standing Shoulder Row with Anchored Resistance - 1 x daily - 7 x weekly - 2 sets - 10 reps Shoulder Extension with Resistance - 1 x daily - 7 x weekly - 2 sets - 10 reps Swiss Ball March - 1 x daily - 7 x weekly - 3 sets - 10 reps Seated Abdominal Press into [employer] - 1 x daily - 7 x weekly - 1 sets - 10 reps - 10 second hold

## 2019-08-18 ENCOUNTER — Encounter: Payer: Self-pay | Admitting: Orthopedic Surgery

## 2019-08-18 ENCOUNTER — Other Ambulatory Visit: Payer: Self-pay

## 2019-08-18 ENCOUNTER — Ambulatory Visit (INDEPENDENT_AMBULATORY_CARE_PROVIDER_SITE_OTHER): Payer: Commercial Managed Care - PPO | Admitting: Orthopedic Surgery

## 2019-08-18 VITALS — BP 152/88 | HR 89 | Ht 66.5 in | Wt 223.4 lb

## 2019-08-18 DIAGNOSIS — M0609 Rheumatoid arthritis without rheumatoid factor, multiple sites: Secondary | ICD-10-CM

## 2019-08-18 DIAGNOSIS — M1611 Unilateral primary osteoarthritis, right hip: Secondary | ICD-10-CM | POA: Diagnosis not present

## 2019-08-18 NOTE — Progress Notes (Signed)
Chief Complaint  Patient presents with  . Back Pain    R side/hip about a 3 on pain scale/doing exercises      Current Outpatient Medications:  .  amLODipine (NORVASC) 10 MG tablet, Take 10 mg by mouth daily. , Disp: , Rfl: 4 .  celecoxib (CELEBREX) 200 MG capsule, Take 200 mg by mouth daily as needed for moderate pain. , Disp: , Rfl: 2 .  escitalopram (LEXAPRO) 20 MG tablet, Take 20 mg by mouth daily., Disp: , Rfl:  .  hydrochlorothiazide (HYDRODIURIL) 25 MG tablet, , Disp: , Rfl:  .  levothyroxine (SYNTHROID) 100 MCG tablet, Take by mouth., Disp: , Rfl:  .  loratadine (CLARITIN) 10 MG tablet, Take 10 mg by mouth daily., Disp: , Rfl:  .  norethindrone-ethinyl estradiol (LOESTRIN) 1-20 MG-MCG tablet, Take 1 tablet by mouth daily., Disp: , Rfl:  .  Tocilizumab (ACTEMRA) 162 MG/0.9ML SOSY, Inject 162 mg into skin every 14 days, Disp: 1.8 mL, Rfl: 3   LAST VISIT: 44 year old female rheumatoid arthritis on Actemra Celebrex with history of hyper both thyroidism and hypertension 5-year history of chronic intermittent right hip pain which became worse in March approaching 10 out of 10 pain trouble walking in the grocery store requiring a boggy difficulty going up and down the stairs and getting it below cars.  She did take some opioid pain medication her pain is currently 5 out of 10 complains of pain in the groin and in the right buttock near the ischium.   THIS VISIT: She has gone to physical therapy now her pain is down to a 3 out of 10  We discussed the regenerate X and Durolane as possible injections for the hip but those are off label uses.  She would be a candidate for knee injection with Durolane, as far as the stem cells go she would have to go to a stem cell center which may be an out-of-pocket cost  Encounter Diagnoses  Name Primary?  . Rheumatoid arthritis of multiple sites with negative rheumatoid factor (HCC)   . Primary osteoarthritis of right hip Yes     Recommend x-ray in 6  months

## 2019-08-20 ENCOUNTER — Ambulatory Visit (HOSPITAL_COMMUNITY): Payer: Commercial Managed Care - PPO | Admitting: Physical Therapy

## 2019-08-20 ENCOUNTER — Encounter (HOSPITAL_COMMUNITY): Payer: Self-pay | Admitting: Physical Therapy

## 2019-08-20 ENCOUNTER — Other Ambulatory Visit: Payer: Self-pay

## 2019-08-20 DIAGNOSIS — R29898 Other symptoms and signs involving the musculoskeletal system: Secondary | ICD-10-CM

## 2019-08-20 DIAGNOSIS — M545 Low back pain, unspecified: Secondary | ICD-10-CM

## 2019-08-20 DIAGNOSIS — R2689 Other abnormalities of gait and mobility: Secondary | ICD-10-CM

## 2019-08-20 DIAGNOSIS — M6281 Muscle weakness (generalized): Secondary | ICD-10-CM

## 2019-08-20 NOTE — Therapy (Signed)
[facility] [address] [place], [place], [address] Phone: [phone]   Fax:  [phone]  Physical Therapy Treatment  Patient Details  Name: Jessica Pacheco MRN: [id number] Date of Birth: 11/29/1975 Referring Provider (PT): Fuller Canada MD   Encounter Date: 08/20/2019   PT End of Session - 08/20/19 1115    Visit Number 7    Number of Visits 12    Date for PT Re-Evaluation 09/10/19    Authorization Type [employer] (no auth 30 VL combined)    Authorization - Visit Number 7    Authorization - Number of Visits 30    Progress Note Due on Visit 10    PT Start Time 1116    PT Stop Time 1156    PT Time Calculation (min) 40 min    Activity Tolerance Patient tolerated treatment well;No increased pain    Behavior During Therapy WFL for tasks assessed/performed           Past Medical History:  Diagnosis Date  . Chronic right lower quadrant pain   . DDD (degenerative disc disease), cervical    c6-7  . GERD (gastroesophageal reflux disease)    WATCHES DIET  . History of hypoglycemia   . History of kidney stones    passed spontaneously  . Hypertension   . Hypothyroidism   . OA (osteoarthritis)    knees , hips, feet  . PONV (postoperative nausea and vomiting)   . Rheumatoid arthritis(714.0) dx  2001  multiple sites   rheumotologist-  dr Molly Maduro gay w/ [facility]--- currently treated w/ oral arava and iv actemra  . SUI (stress urinary incontinence), female     Past Surgical History:  Procedure Laterality Date  . D & C HYSTERECTOMY /  MYOSURE RESECTION OF POLYP  2017     dr Marcelle Overlie  . KNEE ARTHROSCOPY Right 1999  . KNEE ARTHROSCOPY WITH MEDIAL MENISECTOMY Right 02/07/2018   Procedure: KNEE ARTHROSCOPY WITH PARTIAL LATERAL MENISECTOMY;  Surgeon: Vickki Hearing, MD;  Location: [facility];  Service: Orthopedics;  Laterality: Right;  . LAPAROSCOPY N/A 02/16/2017   Procedure: LAPAROSCOPY DIAGNOSTIC;  Surgeon: Richarda Overlie, MD;   Location: [facility];  Service: Gynecology;  Laterality: N/A;  . WISDOM TOOTH EXTRACTION      There were no vitals filed for this visit.   Subjective Assessment - 08/20/19 1115    Subjective Patient states she has been feeling decent. She feels good with the new stuff we did. Her abdominals were sore after last time.    Patient Stated Goals decrease pain    Currently in Pain? Yes    Pain Score 3     Pain Location Back    Pain Onset More than a month ago                             [facility] Adult PT Treatment/Exercise - 08/20/19 0001      Lumbar Exercises: Seated   Other Seated Lumbar Exercises pelvic tilts on on swiss ball 2x 20       Lumbar Exercises: Supine   Pelvic Tilt 20 reps    Pelvic Tilt Limitations 2 sets, with verbal cueing    Dead Bug 10 reps    Dead Bug Limitations 3 sets      Lumbar Exercises: Prone   Other Prone Lumbar Exercises plank on ball 20 seconds      Lumbar Exercises: Quadruped  Madcat/Old Horse 20 reps    Opposite Arm/Leg Raise Right arm/Left leg;Left arm/Right leg;10 reps    Other Quadruped Lumbar Exercises quadruped hip extension with core activitation 10x bilateral                  PT Education - 08/20/19 1115    Education Details Patient educated on HEP, mechanics of exercises, lumbar roll, different plank positions    Person(s) Educated Patient    Methods Explanation;Demonstration    Comprehension Verbalized understanding;Returned demonstration            PT Short Term Goals - 07/30/19 1209      PT SHORT TERM GOAL #1   Title Patient will be independent with HEP in order to improve functional outcomes.    Time 3    Period Weeks    Status New    Target Date 08/20/19      PT SHORT TERM GOAL #2   Title Patient will report at least 25% improvement in symptoms for improved quality of life.    Time 3    Period Weeks    Status New    Target Date 08/20/19             PT Long Term Goals -  07/30/19 1210      PT LONG TERM GOAL #1   Title Patient will report at least 75% improvement in symptoms for improved quality of life.    Time 6    Period Weeks    Status New    Target Date 09/10/19      PT LONG TERM GOAL #2   Title Patient will improve FOTO score by at least 5 points in order to indicate improved tolerance to activity.    Time 6    Period Weeks    Status New    Target Date 09/10/19      PT LONG TERM GOAL #3   Title Patient will be able to complete 5x STS in under 11.4 seconds in order to reduce the risk of falls.    Time 6    Period Weeks    Status New    Target Date 09/10/19                 Plan - 08/20/19 1116    Clinical Impression Statement Today's session with continued focus on core strengthening. She shows good motor control and ability to complete pelvic tilt following verbal cueing for mechanics. She is able to complete the lower extremity portion of dead bug with good mechanics but core muscles fatigue quickly. She is educated on taking rest breaks when she loses core activation and back begins to arch. Patient able to complete all higher level core strengthening exercises without increase in pain and good mechanics following. She performs pelvic tilts in all positions well and states how much she enjoys those ones. She is fatigued at end of session. Patient will benefit from skilled physical therapy in order to improve function and reduce impairment.    Personal Factors and Comorbidities Comorbidity 3+;Age;Fitness;Behavior Pattern;Past/Current Experience;Time since onset of injury/illness/exacerbation    Comorbidities RA, OA, HTN, obesity, sedentary lifestyle    Examination-Activity Limitations Bed Mobility;Bend;Caring for Others;Carry;Lift;Locomotion Level;Sit;Squat;Stairs;Stand;Transfers    Examination-Participation Restrictions Cleaning;Community Activity;Yard Work;Volunteer;Shop;Meal Prep;Laundry    Stability/Clinical Decision Making  Evolving/Moderate complexity    Rehab Potential Fair    PT Frequency 2x / week    PT Duration 6 weeks    PT Treatment/Interventions ADLs/Self Care Home Management;Aquatic  Therapy;Biofeedback;Cryotherapy;Electrical Stimulation;Iontophoresis 4mg /ml Dexamethasone;Moist Heat;Ultrasound;Traction;DME Instruction;Gait training;Stair training;Functional mobility training;Therapeutic activities;Therapeutic exercise;Balance training;Neuromuscular re-education;Patient/family education;Orthotic Fit/Training;Manual techniques;Manual lymph drainage;Compression bandaging;Scar mobilization;Passive range of motion;Dry needling;Energy conservation;Splinting;Taping;Spinal Manipulations;Joint Manipulations    PT Next Visit Plan continue core and possibly add some hip strengthing as able with emphesis on extensors and abductors    PT Home Exercise Plan 07/31/19: ab set, bridge, LTR, supine piriformis st 6/17 POE, press up 6/24 seated ball marches, ab crunch with swiss ball iso 6/30 cat camel, pelvic tilts, bird dog, dead bug           Patient will benefit from skilled therapeutic intervention in order to improve the following deficits and impairments:  Abnormal gait, Decreased activity tolerance, Decreased balance, Decreased mobility, Decreased knowledge of use of DME, Decreased range of motion, Decreased strength, Difficulty walking, Hypomobility, Impaired flexibility, Impaired perceived functional ability, Increased muscle spasms, Improper body mechanics, Postural dysfunction, Pain, Obesity  Visit Diagnosis: Low back pain, unspecified back pain laterality, unspecified chronicity, unspecified whether sciatica present  Muscle weakness (generalized)  Other abnormalities of gait and mobility  Other symptoms and signs involving the musculoskeletal system     Problem List Patient Active Problem List   Diagnosis Date Noted  . Hypertensive disorder 05/14/2019  . Hypothyroidism 05/14/2019  . Migraine 05/14/2019   . Ovarian endometriosis 05/14/2019  . S/P right knee arthroscopy 02/07/18 02/07/2018  . Meniscus, lateral, derangement, right   . Synovitis of right knee   . Arthritis of right knee   . Osteoarthritis, multiple sites 01/14/2018  . Rheumatoid arthritis of multiple sites with negative rheumatoid factor (HCC) 05/08/2016  . Primary osteoarthritis of both knees 08/30/2015  . Primary osteoarthritis of right hip 08/30/2015  . Primary osteoarthritis of left foot 06/30/2014  . Change in stool 07/27/2011  . Contraception 01/02/2011  . Rheumatoid arthritis(714.0) 02/21/1999    12:07 PM, 08/20/19 Wyman Songster PT, DPT Physical Therapist at [facility]  [facility] [address] [place], [place], [address] Phone: [phone]   Fax:  [phone]  Name: Jessica Pacheco MRN: [id number] Date of Birth: 01-Jul-1975

## 2019-08-20 NOTE — Patient Instructions (Signed)
Access Code: [email or web address] URL: [email or web address] Date: 08/20/2019 Prepared by: Greig Castilla Anieya Helman  Exercises Supine Dead Bug with Leg Extension - 1 x daily - 7 x weekly - 3 sets - 10 reps Pelvic Tilt on Swiss Ball - 1 x daily - 7 x weekly - 2 sets - 20 reps Prone Plank Elbows on Ball - 1 x daily - 7 x weekly - 3 reps - 30 second hold Cat-Camel - 1 x daily - 7 x weekly - 1 sets - 20 reps Bird Dog - 1 x daily - 7 x weekly - 2 sets - 10 reps

## 2019-08-22 ENCOUNTER — Encounter (HOSPITAL_COMMUNITY): Payer: Self-pay

## 2019-08-22 ENCOUNTER — Other Ambulatory Visit: Payer: Self-pay

## 2019-08-22 ENCOUNTER — Ambulatory Visit (HOSPITAL_COMMUNITY): Payer: Commercial Managed Care - PPO | Attending: Orthopedic Surgery

## 2019-08-22 DIAGNOSIS — M6281 Muscle weakness (generalized): Secondary | ICD-10-CM

## 2019-08-22 DIAGNOSIS — R2689 Other abnormalities of gait and mobility: Secondary | ICD-10-CM | POA: Diagnosis present

## 2019-08-22 DIAGNOSIS — M545 Low back pain, unspecified: Secondary | ICD-10-CM

## 2019-08-22 DIAGNOSIS — R29898 Other symptoms and signs involving the musculoskeletal system: Secondary | ICD-10-CM | POA: Diagnosis present

## 2019-08-22 NOTE — Therapy (Signed)
[facility] [address] [place], [place], [address] Phone: [phone]   Fax:  [phone]  Physical Therapy Treatment  Patient Details  Name: Jessica Pacheco MRN: [id number] Date of Birth: Aug 08, 1975 Referring Provider (PT): Fuller Canada MD   Encounter Date: 08/22/2019   PT End of Session - 08/22/19 1037    Visit Number 8    Number of Visits 12    Date for PT Re-Evaluation 09/10/19    Authorization Type [employer] (no auth 30 VL combined)    Authorization - Visit Number 8    Authorization - Number of Visits 30    Progress Note Due on Visit 10    PT Start Time 1003    PT Stop Time 1045    PT Time Calculation (min) 42 min    Activity Tolerance Patient tolerated treatment well;No increased pain    Behavior During Therapy WFL for tasks assessed/performed           Past Medical History:  Diagnosis Date  . Chronic right lower quadrant pain   . DDD (degenerative disc disease), cervical    c6-7  . GERD (gastroesophageal reflux disease)    WATCHES DIET  . History of hypoglycemia   . History of kidney stones    passed spontaneously  . Hypertension   . Hypothyroidism   . OA (osteoarthritis)    knees , hips, feet  . PONV (postoperative nausea and vomiting)   . Rheumatoid arthritis(714.0) dx  2001  multiple sites   rheumotologist-  dr Molly Maduro gay w/ [facility]--- currently treated w/ oral arava and iv actemra  . SUI (stress urinary incontinence), female     Past Surgical History:  Procedure Laterality Date  . D & C HYSTERECTOMY /  MYOSURE RESECTION OF POLYP  2017     dr Marcelle Overlie  . KNEE ARTHROSCOPY Right 1999  . KNEE ARTHROSCOPY WITH MEDIAL MENISECTOMY Right 02/07/2018   Procedure: KNEE ARTHROSCOPY WITH PARTIAL LATERAL MENISECTOMY;  Surgeon: Vickki Hearing, MD;  Location: [facility];  Service: Orthopedics;  Laterality: Right;  . LAPAROSCOPY N/A 02/16/2017   Procedure: LAPAROSCOPY DIAGNOSTIC;  Surgeon: Richarda Overlie, MD;   Location: [facility];  Service: Gynecology;  Laterality: N/A;  . WISDOM TOOTH EXTRACTION      There were no vitals filed for this visit.   Subjective Assessment - 08/22/19 1002    Subjective Pt reports quite a bit of abdominal soreness following last session, no reports of pain just sore and stiffness.    Patient Stated Goals decrease pain    Currently in Pain? No/denies    Pain Descriptors / Indicators Sore;Tightness                             [facility] Adult PT Treatment/Exercise - 08/22/19 0001      Lumbar Exercises: Standing   Functional Squats 10 reps    Functional Squats Limitations 3D hip excursion (chair behind for mechanics)      Lumbar Exercises: Supine   Pelvic Tilt 20 reps    Pelvic Tilt Limitations 2 sets, with verbal cueing    Dead Bug 5 reps    Dead Bug Limitations 4 sets      Lumbar Exercises: Sidelying   Other Sidelying Lumbar Exercises side planks 3x 10"      Lumbar Exercises: Prone   Other Prone Lumbar Exercises plank on ball 20 seconds x 3 sets  Lumbar Exercises: Quadruped   Madcat/Old Horse 15 reps    Opposite Arm/Leg Raise Right arm/Left leg;Left arm/Right leg;10 reps    Opposite Arm/Leg Raise Limitations 5" holds    Other Quadruped Lumbar Exercises quadruped firehydrant 2 sets 5 reps; 2# dowel rod on back                    PT Short Term Goals - 07/30/19 1209      PT SHORT TERM GOAL #1   Title Patient will be independent with HEP in order to improve functional outcomes.    Time 3    Period Weeks    Status New    Target Date 08/20/19      PT SHORT TERM GOAL #2   Title Patient will report at least 25% improvement in symptoms for improved quality of life.    Time 3    Period Weeks    Status New    Target Date 08/20/19             PT Long Term Goals - 07/30/19 1210      PT LONG TERM GOAL #1   Title Patient will report at least 75% improvement in symptoms for improved quality of life.     Time 6    Period Weeks    Status New    Target Date 09/10/19      PT LONG TERM GOAL #2   Title Patient will improve FOTO score by at least 5 points in order to indicate improved tolerance to activity.    Time 6    Period Weeks    Status New    Target Date 09/10/19      PT LONG TERM GOAL #3   Title Patient will be able to complete 5x STS in under 11.4 seconds in order to reduce the risk of falls.    Time 6    Period Weeks    Status New    Target Date 09/10/19                 Plan - 08/22/19 1038    Clinical Impression Statement Began session with 3D hip excursion for lumbar and hip mobility following reoprts of stiffness this session.  Continues sessoin focus with core and proximal strengthening.  Pt shows good motor control and mechanics iwht posterior pelvic tilt with no cueing required.  Added dowel rod across back wiht quadruped activities to improve core activation and reduce lumbar curvature.  Progressed to dynamic surface with palloff exercises.    Personal Factors and Comorbidities Comorbidity 3+;Age;Fitness;Behavior Pattern;Past/Current Experience;Time since onset of injury/illness/exacerbation    Comorbidities RA, OA, HTN, obesity, sedentary lifestyle    Examination-Activity Limitations Bed Mobility;Bend;Caring for Others;Carry;Lift;Locomotion Level;Sit;Squat;Stairs;Stand;Transfers    Examination-Participation Restrictions Cleaning;Community Activity;Yard Work;Volunteer;Shop;Meal Prep;Laundry    Stability/Clinical Decision Making Evolving/Moderate complexity    Clinical Decision Making Moderate    Rehab Potential Fair    PT Frequency 2x / week    PT Duration 6 weeks    PT Treatment/Interventions ADLs/Self Care Home Management;Aquatic Therapy;Biofeedback;Cryotherapy;Electrical Stimulation;Iontophoresis 4mg /ml Dexamethasone;Moist Heat;Ultrasound;Traction;DME Instruction;Gait training;Stair training;Functional mobility training;Therapeutic activities;Therapeutic  exercise;Balance training;Neuromuscular re-education;Patient/family education;Orthotic Fit/Training;Manual techniques;Manual lymph drainage;Compression bandaging;Scar mobilization;Passive range of motion;Dry needling;Energy conservation;Splinting;Taping;Spinal Manipulations;Joint Manipulations    PT Next Visit Plan continue core and possibly add some hip strengthing as able with emphesis on extensors and abductors    PT Home Exercise Plan 07/31/19: ab set, bridge, LTR, supine piriformis st 6/17 POE, press up 6/24 seated ball marches, ab  crunch with swiss ball iso 6/30 cat camel, pelvic tilts, bird dog, dead bug           Patient will benefit from skilled therapeutic intervention in order to improve the following deficits and impairments:  Abnormal gait, Decreased activity tolerance, Decreased balance, Decreased mobility, Decreased knowledge of use of DME, Decreased range of motion, Decreased strength, Difficulty walking, Hypomobility, Impaired flexibility, Impaired perceived functional ability, Increased muscle spasms, Improper body mechanics, Postural dysfunction, Pain, Obesity  Visit Diagnosis: Low back pain, unspecified back pain laterality, unspecified chronicity, unspecified whether sciatica present  Muscle weakness (generalized)  Other abnormalities of gait and mobility  Other symptoms and signs involving the musculoskeletal system     Problem List Patient Active Problem List   Diagnosis Date Noted  . Hypertensive disorder 05/14/2019  . Hypothyroidism 05/14/2019  . Migraine 05/14/2019  . Ovarian endometriosis 05/14/2019  . S/P right knee arthroscopy 02/07/18 02/07/2018  . Meniscus, lateral, derangement, right   . Synovitis of right knee   . Arthritis of right knee   . Osteoarthritis, multiple sites 01/14/2018  . Rheumatoid arthritis of multiple sites with negative rheumatoid factor (HCC) 05/08/2016  . Primary osteoarthritis of both knees 08/30/2015  . Primary osteoarthritis  of right hip 08/30/2015  . Primary osteoarthritis of left foot 06/30/2014  . Change in stool 07/27/2011  . Contraception 01/02/2011  . Rheumatoid arthritis(714.0) 02/21/1999   Becky Sax, LPTA/CLT; CBIS [phone]  Juel Burrow 08/22/2019, 10:56 AM  [facility] [address] [place], [place], [address] Phone: [phone]   Fax:  [phone]  Name: ALESSIA GONSALEZ MRN: [id number] Date of Birth: June 02, 1975

## 2019-08-26 ENCOUNTER — Ambulatory Visit (HOSPITAL_COMMUNITY): Payer: Commercial Managed Care - PPO | Admitting: Physical Therapy

## 2019-08-26 ENCOUNTER — Encounter (HOSPITAL_COMMUNITY): Payer: Self-pay | Admitting: Physical Therapy

## 2019-08-26 ENCOUNTER — Other Ambulatory Visit: Payer: Self-pay

## 2019-08-26 DIAGNOSIS — R29898 Other symptoms and signs involving the musculoskeletal system: Secondary | ICD-10-CM

## 2019-08-26 DIAGNOSIS — R2689 Other abnormalities of gait and mobility: Secondary | ICD-10-CM

## 2019-08-26 DIAGNOSIS — M545 Low back pain, unspecified: Secondary | ICD-10-CM

## 2019-08-26 DIAGNOSIS — M6281 Muscle weakness (generalized): Secondary | ICD-10-CM

## 2019-08-26 NOTE — Therapy (Signed)
[facility] [address] [place], [place], [address] Phone: [phone]   Fax:  [phone]  Physical Therapy Treatment  Patient Details  Name: Jessica Pacheco MRN: [id number] Date of Birth: 1975-05-23 Referring Provider (PT): Fuller Canada MD   Encounter Date: 08/26/2019   PT End of Session - 08/26/19 1347    Visit Number 9    Number of Visits 12    Date for PT Re-Evaluation 09/10/19    Authorization Type [employer] (no auth 30 VL combined)    Authorization - Visit Number 9    Authorization - Number of Visits 30    Progress Note Due on Visit 10    PT Start Time 1348    PT Stop Time 1428    PT Time Calculation (min) 40 min    Activity Tolerance Patient tolerated treatment well;No increased pain    Behavior During Therapy WFL for tasks assessed/performed           Past Medical History:  Diagnosis Date  . Chronic right lower quadrant pain   . DDD (degenerative disc disease), cervical    c6-7  . GERD (gastroesophageal reflux disease)    WATCHES DIET  . History of hypoglycemia   . History of kidney stones    passed spontaneously  . Hypertension   . Hypothyroidism   . OA (osteoarthritis)    knees , hips, feet  . PONV (postoperative nausea and vomiting)   . Rheumatoid arthritis(714.0) dx  2001  multiple sites   rheumotologist-  dr Molly Maduro gay w/ [facility]--- currently treated w/ oral arava and iv actemra  . SUI (stress urinary incontinence), female     Past Surgical History:  Procedure Laterality Date  . D & C HYSTERECTOMY /  MYOSURE RESECTION OF POLYP  2017     dr Marcelle Overlie  . KNEE ARTHROSCOPY Right 1999  . KNEE ARTHROSCOPY WITH MEDIAL MENISECTOMY Right 02/07/2018   Procedure: KNEE ARTHROSCOPY WITH PARTIAL LATERAL MENISECTOMY;  Surgeon: Vickki Hearing, MD;  Location: [facility];  Service: Orthopedics;  Laterality: Right;  . LAPAROSCOPY N/A 02/16/2017   Procedure: LAPAROSCOPY DIAGNOSTIC;  Surgeon: Richarda Overlie, MD;   [facility];  Service: Gynecology;  Laterality: N/A;  . WISDOM TOOTH EXTRACTION      There were no vitals filed for this visit.   Subjective Assessment - 08/26/19 1346    Subjective Patient states she was feeling good until Saturday when she vacuumed her church all day. Her back is better than it was Saturday. Exercises have been doing good. Her favorite is the dead bug exercise.    Patient Stated Goals decrease pain    Currently in Pain? Yes    Pain Score 4     Pain Location Back                             [facility] Adult PT Treatment/Exercise - 08/26/19 0001      Lumbar Exercises: Standing   Other Standing Lumbar Exercises hip hike 2x10 bilateral      Lumbar Exercises: Seated   Other Seated Lumbar Exercises seated on swiss ball ab set with marching/alternating arms 3x10 bilateral       Lumbar Exercises: Supine   Bridge with clamshell 10 reps    Bridge with Ball Squeeze Limitations 2 sets blue band      Lumbar Exercises: Quadruped   Madcat/Old Horse 20 reps  Other Quadruped Lumbar Exercises quadruped firehydrant 3 sets 5 reps                  PT Education - 08/26/19 1346    Education Details Patient educated on HEP, mechanics of exercises    Person(s) Educated Patient    Methods Explanation;Demonstration    Comprehension Verbalized understanding;Returned demonstration            PT Short Term Goals - 07/30/19 1209      PT SHORT TERM GOAL #1   Title Patient will be independent with HEP in order to improve functional outcomes.    Time 3    Period Weeks    Status New    Target Date 08/20/19      PT SHORT TERM GOAL #2   Title Patient will report at least 25% improvement in symptoms for improved quality of life.    Time 3    Period Weeks    Status New    Target Date 08/20/19             PT Long Term Goals - 07/30/19 1210      PT LONG TERM GOAL #1   Title Patient will report at least 75% improvement in  symptoms for improved quality of life.    Time 6    Period Weeks    Status New    Target Date 09/10/19      PT LONG TERM GOAL #2   Title Patient will improve FOTO score by at least 5 points in order to indicate improved tolerance to activity.    Time 6    Period Weeks    Status New    Target Date 09/10/19      PT LONG TERM GOAL #3   Title Patient will be able to complete 5x STS in under 11.4 seconds in order to reduce the risk of falls.    Time 6    Period Weeks    Status New    Target Date 09/10/19                 Plan - 08/26/19 1347    Clinical Impression Statement Patient able to complete seated marches and able to add alternating with improving seated balance and posture. Patient states hip hurt with firehydrant exercise, reviewed today and patient able to complete without symptoms with limiting hip and lumbar ROM. Patient able to complete bridge with clams with good mechanics following verbal and tactile cueing. No other c/o increased symptoms throughout session. Anticipate D/c next session. Patient will continue to benefit from skilled physical therapy in order to reduce impairment and improve function.    Personal Factors and Comorbidities Comorbidity 3+;Age;Fitness;Behavior Pattern;Past/Current Experience;Time since onset of injury/illness/exacerbation    Comorbidities RA, OA, HTN, obesity, sedentary lifestyle    Examination-Activity Limitations Bed Mobility;Bend;Caring for Others;Carry;Lift;Locomotion Level;Sit;Squat;Stairs;Stand;Transfers    Examination-Participation Restrictions Cleaning;Community Activity;Yard Work;Volunteer;Shop;Meal Prep;Laundry    Stability/Clinical Decision Making Evolving/Moderate complexity    Rehab Potential Fair    PT Frequency 2x / week    PT Duration 6 weeks    PT Treatment/Interventions ADLs/Self Care Home Management;Aquatic Therapy;Biofeedback;Cryotherapy;Electrical Stimulation;Iontophoresis 4mg /ml Dexamethasone;Moist  Heat;Ultrasound;Traction;DME Instruction;Gait training;Stair training;Functional mobility training;Therapeutic activities;Therapeutic exercise;Balance training;Neuromuscular re-education;Patient/family education;Orthotic Fit/Training;Manual techniques;Manual lymph drainage;Compression bandaging;Scar mobilization;Passive range of motion;Dry needling;Energy conservation;Splinting;Taping;Spinal Manipulations;Joint Manipulations    PT Next Visit Plan continue core and possibly add some hip strengthing as able with emphesis on extensors and abductors    PT Home Exercise Plan 07/31/19: ab set,  bridge, LTR, supine piriformis st 6/17 POE, press up 6/24 seated ball marches, ab crunch with swiss ball iso 6/30 cat camel, pelvic tilts, bird dog, dead bug 09-Sep-2022 bridge with clam, hip hike, fire hydrant, marches on ball           Patient will benefit from skilled therapeutic intervention in order to improve the following deficits and impairments:  Abnormal gait, Decreased activity tolerance, Decreased balance, Decreased mobility, Decreased knowledge of use of DME, Decreased range of motion, Decreased strength, Difficulty walking, Hypomobility, Impaired flexibility, Impaired perceived functional ability, Increased muscle spasms, Improper body mechanics, Postural dysfunction, Pain, Obesity  Visit Diagnosis: Low back pain, unspecified back pain laterality, unspecified chronicity, unspecified whether sciatica present  Muscle weakness (generalized)  Other abnormalities of gait and mobility  Other symptoms and signs involving the musculoskeletal system     Problem List Patient Active Problem List   Diagnosis Date Noted  . Hypertensive disorder 05/14/2019  . Hypothyroidism 05/14/2019  . Migraine 05/14/2019  . Ovarian endometriosis 05/14/2019  . S/P right knee arthroscopy 02/07/18 02/07/2018  . Meniscus, lateral, derangement, right   . Synovitis of right knee   . Arthritis of right knee   . Osteoarthritis,  multiple sites 01/14/2018  . Rheumatoid arthritis of multiple sites with negative rheumatoid factor (HCC) 05/08/2016  . Primary osteoarthritis of both knees 08/30/2015  . Primary osteoarthritis of right hip 08/30/2015  . Primary osteoarthritis of left foot 06/30/2014  . Change in stool 07/27/2011  . Contraception 01/02/2011  . Rheumatoid arthritis(714.0) 02/21/1999    2:32 PM, September 09, 2019 Wyman Songster PT, DPT Physical Therapist at [facility]  [facility] [address] [place], [place], [address] Phone: [phone]   Fax:  [phone]  Name: Jessica Pacheco MRN: [id number] Date of Birth: November 09, 1975

## 2019-08-28 ENCOUNTER — Ambulatory Visit (HOSPITAL_COMMUNITY): Payer: Commercial Managed Care - PPO | Admitting: Physical Therapy

## 2019-08-28 ENCOUNTER — Other Ambulatory Visit: Payer: Self-pay

## 2019-08-28 ENCOUNTER — Encounter (HOSPITAL_COMMUNITY): Payer: Self-pay | Admitting: Physical Therapy

## 2019-08-28 DIAGNOSIS — R2689 Other abnormalities of gait and mobility: Secondary | ICD-10-CM

## 2019-08-28 DIAGNOSIS — M545 Low back pain, unspecified: Secondary | ICD-10-CM

## 2019-08-28 DIAGNOSIS — M6281 Muscle weakness (generalized): Secondary | ICD-10-CM

## 2019-08-28 DIAGNOSIS — R29898 Other symptoms and signs involving the musculoskeletal system: Secondary | ICD-10-CM

## 2019-08-28 NOTE — Patient Instructions (Signed)
Access Code: [email or web address] URL: [email or web address] Date: 08/28/2019 Prepared by: Greig Castilla Imojean Yoshino  Exercises Standing Hip Hinge with Dowel - 1 x daily - 7 x weekly - 2 sets - 10 reps

## 2019-08-28 NOTE — Therapy (Signed)
[place], [place], [address] Phone: [phone]   Fax:  [phone]  Physical Therapy Treatment/ Progress Note/Discharge Summary  Patient Details  Name: Jessica Pacheco MRN: [id number] Date of Birth: Oct 30, 1975 Referring Provider (PT): Arther Abbott MD   Encounter Date: 08/28/2019  Progress Note   Reporting Period 07/30/19 to 08/28/19   See note below for Objective Data and Assessment of Progress/Goals  PHYSICAL THERAPY DISCHARGE SUMMARY  Visits from Start of Care: 10  Current functional level related to goals / functional outcomes: See below   Remaining deficits: See below   Education / Equipment: See below  Plan: Patient agrees to discharge.  Patient goals were met. Patient is being discharged due to meeting the stated rehab goals.  ?????        PT End of Session - 08/28/19 1437    Visit Number 10    Number of Visits 12    Date for PT Re-Evaluation 09/10/19    Authorization Type [employer] (no auth 30 VL combined)    Authorization - Visit Number 10    Authorization - Number of Visits 30    Progress Note Due on Visit 10    PT Start Time 5400    PT Stop Time 1512    PT Time Calculation (min) 39 min    Activity Tolerance Patient tolerated treatment well    Behavior During Therapy WFL for tasks assessed/performed           Past Medical History:  Diagnosis Date  . Chronic right lower quadrant pain   . DDD (degenerative disc disease), cervical    c6-7  . GERD (gastroesophageal reflux disease)    WATCHES DIET  . History of hypoglycemia   . History of kidney stones    passed spontaneously  . Hypertension   . Hypothyroidism   . OA (osteoarthritis)    knees , hips, feet  . PONV (postoperative nausea and vomiting)   . Rheumatoid arthritis(714.0) dx  2001  multiple sites   rheumotologist-  dr Herbie [place] gay w/ [facility]--- currently treated w/ oral arava and iv actemra  . SUI (stress  urinary incontinence), female     Past Surgical History:  Procedure Laterality Date  . D & C HYSTERECTOMY /  MYOSURE RESECTION OF POLYP  2017     dr Matthew Saras  . KNEE ARTHROSCOPY Right 1999  . KNEE ARTHROSCOPY WITH MEDIAL MENISECTOMY Right 02/07/2018   Procedure: KNEE ARTHROSCOPY WITH PARTIAL LATERAL MENISECTOMY;  Surgeon: Carole Civil, MD;  Location: [facility];  Service: Orthopedics;  Laterality: Right;  . LAPAROSCOPY N/A 02/16/2017   Procedure: LAPAROSCOPY DIAGNOSTIC;  Surgeon: Molli Posey, MD;  Location: [facility];  Service: Gynecology;  Laterality: N/A;  . WISDOM TOOTH EXTRACTION      There were no vitals filed for this visit.   Subjective Assessment - 08/28/19 1433    Subjective Patient states she is feeling decent today. Back stiffness is probably about a 2/10 today. Stiffness is worst in the morning. Patient is independent with HEP. Patient states 75% improvement with physical therapy intervention. She feels she is able to manage her own condition with skills being provided and is ready to make today her last day.    Patient Stated Goals decrease pain    Currently in Pain? Yes    Pain Score 2    stiffness   Pain Location Back  [facility] PT Assessment - 08/28/19 0001      Assessment   Medical Diagnosis LBP and bilateral hip pain    Referring Provider (PT) Arther Abbott MD    Onset Date/Surgical Date 05/11/19    Prior Therapy 2000 for knee      Precautions   Precautions None      Restrictions   Weight Bearing Restrictions No      Balance Screen   Has the patient fallen in the past 6 months No    Has the patient had a decrease in activity level because of a fear of falling?  No    Is the patient reluctant to leave their home because of a fear of falling?  No      Prior Function   Level of Independence Independent      Cognition   Overall Cognitive Status Within Functional Limits for tasks assessed      Observation/Other  Assessments   Focus on Therapeutic Outcomes (FOTO)  37% limited      AROM   Lumbar Flexion 0% limited    Lumbar Extension 0% limited    Lumbar - Right Side Bend 0% limited    Lumbar - Left Side Bend 0% limited    Lumbar - Right Rotation 0% limited    Lumbar - Left Rotation 0% limited      Strength   Right Hip Flexion 4+/5    Right Hip Extension 5/5    Right Hip ABduction 5/5    Left Hip Flexion 4+/5    Left Hip Extension 5/5    Left Hip ABduction 5/5    Right Knee Flexion 5/5    Right Knee Extension 5/5    Left Knee Flexion 5/5    Left Knee Extension 5/5    Right Ankle Dorsiflexion 5/5    Left Ankle Dorsiflexion 5/5      Transfers   Five time sit to stand comments  11.92 seconds                         [facility] Adult PT Treatment/Exercise - 08/28/19 0001      Lumbar Exercises: Standing   Other Standing Lumbar Exercises hip hinge 2x10                  PT Education - 08/28/19 1436    Education Details Patient educated on HEP, mechanics of exercises, reassessment findings, progress made, hip hinging for decreased strain on low back, returning to PT if needed    Person(s) Educated Patient    Methods Explanation;Demonstration;Handout    Comprehension Verbalized understanding;Returned demonstration            PT Short Term Goals - 08/28/19 1440      PT SHORT TERM GOAL #1   Title Patient will be independent with HEP in order to improve functional outcomes.    Time 3    Period Weeks    Status Achieved    Target Date 08/20/19      PT SHORT TERM GOAL #2   Title Patient will report at least 25% improvement in symptoms for improved quality of life.    Time 3    Period Weeks    Status Achieved    Target Date 08/20/19             PT Long Term Goals - 08/28/19 1441      PT LONG TERM GOAL #1   Title Patient will report  at least 75% improvement in symptoms for improved quality of life.    Time 6    Period Weeks    Status Achieved      PT  LONG TERM GOAL #2   Title Patient will improve FOTO score by at least 5 points in order to indicate improved tolerance to activity.    Time 6    Period Weeks    Status Achieved      PT LONG TERM GOAL #3   Title Patient will be able to complete 5x STS in under 11.4 seconds in order to reduce the risk of falls.    Time 6    Period Weeks    Status Achieved                 Plan - 08/28/19 1528    Clinical Impression Statement Patient has met all short and long term goals with improved symptoms/function, activity tolerance, strength, ROM, functional mobility, and ability to complete HEP. Patient remains limited by morning stiffness. Patient is independent with core and hip strengthening exercise and has shown ability to manage symptoms. Patient states difficulty with picking things up in her garden and is shown hip hinging strategies in order to avoid low back strain with gardening. Patient able to complete with good mechanics. Patient educated today on continuing HEP, exercise mechanics, reassessment findings, progress made and returning to physical therapy if need. Patient discharged from physical therapy at this time.    Personal Factors and Comorbidities Comorbidity 3+;Age;Fitness;Behavior Pattern;Past/Current Experience;Time since onset of injury/illness/exacerbation    Comorbidities RA, OA, HTN, obesity, sedentary lifestyle    Examination-Activity Limitations Bed Mobility;Bend;Caring for Others;Carry;Lift;Locomotion Level;Sit;Squat;Stairs;Stand;Transfers    Examination-Participation Restrictions Cleaning;Community Activity;Yard Work;Volunteer;Shop;Meal Prep;Laundry    Rehab Potential Fair    PT Treatment/Interventions ADLs/Self Care Home Management;Aquatic Therapy;Biofeedback;Cryotherapy;Electrical Stimulation;Iontophoresis 53m/ml Dexamethasone;Moist Heat;Ultrasound;Traction;DME Instruction;Gait training;Stair training;Functional mobility training;Therapeutic activities;Therapeutic  exercise;Balance training;Neuromuscular re-education;Patient/family education;Orthotic Fit/Training;Manual techniques;Manual lymph drainage;Compression bandaging;Scar mobilization;Passive range of motion;Dry needling;Energy conservation;Splinting;Taping;Spinal Manipulations;Joint Manipulations    PT Next Visit Plan n/a    PT Home Exercise Plan 07/31/19: ab set, bridge, LTR, supine piriformis st 6/17 POE, press up 6/24 seated ball marches, ab crunch with swiss ball iso 6/30 cat camel, pelvic tilts, bird dog, dead bug [id number] with clam, hip hike, fire hydrant, marches on ball 7/8 hip hinge           Patient will benefit from skilled therapeutic intervention in order to improve the following deficits and impairments:  Abnormal gait, Decreased activity tolerance, Decreased balance, Decreased mobility, Decreased knowledge of use of DME, Decreased range of motion, Decreased strength, Difficulty walking, Hypomobility, Impaired flexibility, Impaired perceived functional ability, Increased muscle spasms, Improper body mechanics, Postural dysfunction, Pain, Obesity  Visit Diagnosis: Low back pain, unspecified back pain laterality, unspecified chronicity, unspecified whether sciatica present  Muscle weakness (generalized)  Other abnormalities of gait and mobility  Other symptoms and signs involving the musculoskeletal system     Problem List Patient Active Problem List   Diagnosis Date Noted  . Hypertensive disorder 05/14/2019  . Hypothyroidism 05/14/2019  . Migraine 05/14/2019  . Ovarian endometriosis 05/14/2019  . S/P right knee arthroscopy 02/07/18 02/07/2018  . Meniscus, lateral, derangement, right   . Synovitis of right knee   . Arthritis of right knee   . Osteoarthritis, multiple sites 01/14/2018  . Rheumatoid arthritis of multiple sites with negative rheumatoid factor ([place] 05/08/2016  . Primary osteoarthritis of both knees 08/30/2015  . Primary osteoarthritis of right hip  08/30/2015  .  Primary osteoarthritis of left foot 06/30/2014  . Change in stool 07/27/2011  . Contraception 01/02/2011  . Rheumatoid arthritis(714.0) 02/21/1999    3:36 PM, 08/28/19 Mearl Latin PT, DPT Physical Therapist at [place], [place], [address] Phone: [phone]   Fax:  [phone]  Name: Jessica Pacheco MRN: [id number] Date of Birth: 22-May-1975

## 2019-09-02 ENCOUNTER — Encounter (HOSPITAL_COMMUNITY): Payer: Commercial Managed Care - PPO | Admitting: Physical Therapy

## 2019-09-04 ENCOUNTER — Encounter (HOSPITAL_COMMUNITY): Payer: Commercial Managed Care - PPO

## 2019-11-10 ENCOUNTER — Other Ambulatory Visit (HOSPITAL_COMMUNITY): Payer: Self-pay | Admitting: Obstetrics and Gynecology

## 2019-11-10 DIAGNOSIS — Z1231 Encounter for screening mammogram for malignant neoplasm of breast: Secondary | ICD-10-CM

## 2019-12-15 ENCOUNTER — Ambulatory Visit (HOSPITAL_COMMUNITY)
Admission: RE | Admit: 2019-12-15 | Discharge: 2019-12-15 | Disposition: A | Discharge status: Home / Self Care | Payer: Commercial Managed Care - PPO | Source: Ambulatory Visit | Attending: Obstetrics and Gynecology | Admitting: Obstetrics and Gynecology

## 2019-12-15 ENCOUNTER — Other Ambulatory Visit: Payer: Self-pay

## 2019-12-15 DIAGNOSIS — Z1231 Encounter for screening mammogram for malignant neoplasm of breast: Secondary | ICD-10-CM | POA: Insufficient documentation

## 2019-12-30 ENCOUNTER — Telehealth: Payer: Self-pay | Admitting: Orthopedic Surgery

## 2019-12-30 NOTE — Telephone Encounter (Signed)
Call received from provider Dr Jacinta Shoe, requesting peer to peer review on this patient. Did not identify if representing an insurer.  Phone# [phone]

## 2020-02-05 ENCOUNTER — Encounter: Payer: Self-pay | Admitting: Orthopedic Surgery

## 2020-02-05 NOTE — Progress Notes (Unsigned)
February 05, 2020 reference Jessica Pacheco date of birth [birth year] questions have been requested from the [employer]  All of these questions have already been answered in the medical record if reviewed carefully however to assist in the patient's claim and the insurance companies expeditious handling of this matter I am answering the questions in another format  The patient should not stand or walk for more than 30 minutes  The patient's diagnosis is osteoarthritis with a subclinical rheumatoid pattern please asked rheumatologist questions regarding the testing and lab results  The patient has had arthroscopy and x-ray which show degeneration of cartilage in the knee joint  As for questions #4 5 and 60 anteriors are none and none and the patient's last question #6 the answer is yes  Thank you for your time  Fuller Canada, Montez Hageman., MD

## 2020-02-05 NOTE — Progress Notes (Signed)
February 05, 2020 letter to the [employer]  Reference Jessica Pacheco  Date of birth 01/26/1996  The questions that have been proposed are already answered in the medical record but to assist the patient and the insurer I am answering the questions additionally at my time and expense  Question #1 patient's restrictions include standing and walking less than 30 minutes  She carries a diagnosis of rheumatoid arthritis please refer to rheumatologist for that information  She has had arthroscopy and x-rays which show that she has cartilage degeneration in her knee  There are no scheduled follow-up visits  I am not the primary provider regarding her care  As to whether the patient is able to work sedentary position most likely she is   Vickki Hearing, Montez Hageman., MD

## 2020-02-16 ENCOUNTER — Ambulatory Visit: Payer: Commercial Managed Care - PPO

## 2020-02-16 ENCOUNTER — Encounter: Payer: Self-pay | Admitting: Orthopedic Surgery

## 2020-02-16 ENCOUNTER — Other Ambulatory Visit: Payer: Self-pay

## 2020-02-16 ENCOUNTER — Ambulatory Visit (INDEPENDENT_AMBULATORY_CARE_PROVIDER_SITE_OTHER): Payer: Commercial Managed Care - PPO | Admitting: Orthopedic Surgery

## 2020-02-16 VITALS — BP 150/89 | HR 97 | Ht 66.5 in | Wt 225.0 lb

## 2020-02-16 DIAGNOSIS — M1710 Unilateral primary osteoarthritis, unspecified knee: Secondary | ICD-10-CM

## 2020-02-16 DIAGNOSIS — M1610 Unilateral primary osteoarthritis, unspecified hip: Secondary | ICD-10-CM

## 2020-02-16 DIAGNOSIS — M1711 Unilateral primary osteoarthritis, right knee: Secondary | ICD-10-CM

## 2020-02-16 DIAGNOSIS — M1611 Unilateral primary osteoarthritis, right hip: Secondary | ICD-10-CM

## 2020-02-16 NOTE — Progress Notes (Signed)
FOLLOW-UP OFFICE VISIT   Encounter Diagnoses  Name Primary?  Marland Kitchen Arthritis of knee Yes  . Arthritis of hip     44 year old female with seronegative rheumatoid arthritis currently on Plaquenil and Celebrex which was recently cut from 200 mg twice a day to 200 mg once a day secondary to elevated liver enzymes  Status post arthroscopy right knee  Has arthritis right hip and right knee  Complains of increased pain right hip groin radiating into the anterior thigh down to the knee as well as anterior knee pain which worsened after Celebrex was decreased  She is followed by rheumatology     Past Medical History:  Diagnosis Date  . Chronic right lower quadrant pain   . DDD (degenerative disc disease), cervical    c6-7  . GERD (gastroesophageal reflux disease)    WATCHES DIET  . History of hypoglycemia   . History of kidney stones    passed spontaneously  . Hypertension   . Hypothyroidism   . OA (osteoarthritis)    knees , hips, feet  . PONV (postoperative nausea and vomiting)   . Rheumatoid arthritis(714.0) dx  2001  multiple sites   rheumotologist-  dr Molly Maduro gay w/ [facility]--- currently treated w/ oral arava and iv actemra  . SUI (stress urinary incontinence), female    ROS  Multiple joint pain with increased joint pain after starting Plaquenil  Back and buttock pain  + EXAM FINDINGS:   She is awake alert and oriented x3 mood and affect are normal BP (!) 150/89   Pulse 97   Ht 5' 6.5" (1.689 m)   Wt 225 lb (102.1 kg)   BMI 35.77 kg/m    General appearance no gross deformities no joint rheumatologic type deformities yet  She does have free range of motion of the right knee with pain and crepitance in the patellofemoral region with exacerbated pain with patellofemoral compression knee is stable skin is normal strength is excellent  Painful flexion of the right hip though range of motion is preserved, leg lengths are equal  Imaging of the right knee shows  osteoarthritis with 5 degree valgus alignment osteophytes in the femoral area suggest degenerative joint disease  Osteophytes also noted in the femoral head with a large osteophyte at the femoral neck and a lateral osteophyte at the acetabulum with bone-on-bone changes     ASSESSMENT AND PLAN  44 year old female with seronegative rheumatoid arthritis with imaging findings suggesting osteoarthritis end-stage hip disease worsening pain with anterior knee pain status post knee arthroscopy  Patient is interested in anterior hip approach for her total hip  Referral is made patient will need surgical correction of her right hip  I will see her in 6 months for her right knee with x-ray  Encounter Diagnoses  Name Primary?  Marland Kitchen Arthritis of knee Yes  . Arthritis of hip

## 2020-02-16 NOTE — Patient Instructions (Signed)
Refer to dr Rayburn Ma for THA   F/U here for x-rays right knee

## 2020-03-03 ENCOUNTER — Ambulatory Visit (INDEPENDENT_AMBULATORY_CARE_PROVIDER_SITE_OTHER): Payer: Commercial Managed Care - PPO | Admitting: Orthopaedic Surgery

## 2020-03-03 VITALS — Ht 66.5 in | Wt 225.0 lb

## 2020-03-03 DIAGNOSIS — M1611 Unilateral primary osteoarthritis, right hip: Secondary | ICD-10-CM | POA: Diagnosis not present

## 2020-03-03 NOTE — Progress Notes (Signed)
Office Visit Note   Patient: Jessica Pacheco           Date of Birth: September 18, 1975           MRN: [id number] Visit Date: 03/03/2020              Requested by: Vickki Hearing, MD [address] [place],  [place] [address] PCP: Carylon Perches, MD   Assessment & Plan: Visit Diagnoses:  1. Primary osteoarthritis of right hip     Plan: I went over the patient's x-rays in detail and her clinical exam findings.  We did talk in detail about hip replacement surgery.  She is a good candidate for this given the severity of her arthritis and the detrimental effect her right hip pain is having on her quality of life, her mobility and actives of daily living.  I did talk about the intraoperative and postoperative course of surgery such as this.  I gave her handout and went over hip model.  I went over her x-rays with her in detail.  We discussed the risk and benefits of surgery in detail.  All questions and concerns were answered and addressed.  She has a 71 year old daughter and she would like to wait until June for surgery when the child is out of school.  We will work on getting this scheduled.  Follow-Up Instructions: No follow-ups on file.   Orders:  No orders of the defined types were placed in this encounter.  No orders of the defined types were placed in this encounter.     Procedures: No procedures performed   Clinical Data: No additional findings.   Subjective: Chief Complaint  Patient presents with  . Right Hip - Pain  The patient is a very pleasant 45 year old female that I am seeing for the first time.  She is referred from Dr. Romeo Apple at [facility] to evaluate and treat known osteoarthritis of the right hip.  She does have a history of rheumatoid disease as well but her right hip is more of an osteoarthritis situation.  She does take Plaquenil.  She is active as she can be.  She does ambulate using an assistive device having to use a cane.  Her right hip pain is  daily and at this point is definitely affecting her mobility, her quality of life and activities daily living.  He has been getting worse for well over 12 months now.  She is work on activity modification and weight loss and takes anti-inflammatories as well as her rheumatologic medications.  She is not a diabetic.  She denies any acute change in her medical status otherwise.  Her right hip pain is mainly in the groin and it is 10 out of 10 on a daily basis.  HPI  Review of Systems She currently denies any headache, chest pain, shortness of breath, fever, chills, nausea, vomiting  Objective: Vital Signs: Ht 5' 6.5" (1.689 m)   Wt 225 lb (102.1 kg)   BMI 35.77 kg/m   Physical Exam She is alert and oriented x3 and in no acute distress Ortho Exam 's examination of her right hip shows stiffness with internal and external rotation as well as severe pain in the groin with rotation.  Her left hip rotates normally.  I had her lay in a supine position and she does have a leg length discrepancy with her right hip and lower extremity slightly shorter than the left side. Specialty Comments:  No specialty comments  available.  Imaging: No results found. X-rays in the canopy system of her pelvis and right hip show a normal left hip where her right hip has severe end-stage arthritis.  There is complete loss of the superior lateral joint space.  There are large peritracheal osteophytes around the acetabulum and the lateral femoral head with slight flattening of the lateral femoral head.  PMFS History: Patient Active Problem List   Diagnosis Date Noted  . Hypertensive disorder 05/14/2019  . Hypothyroidism 05/14/2019  . Migraine 05/14/2019  . Ovarian endometriosis 05/14/2019  . S/P right knee arthroscopy 02/07/18 02/07/2018  . Meniscus, lateral, derangement, right   . Synovitis of right knee   . Arthritis of right knee   . Osteoarthritis, multiple sites 01/14/2018  . Rheumatoid arthritis of multiple  sites with negative rheumatoid factor (HCC) 05/08/2016  . Primary osteoarthritis of both knees 08/30/2015  . Primary osteoarthritis of right hip 08/30/2015  . Primary osteoarthritis of left foot 06/30/2014  . Change in stool 07/27/2011  . Contraception 01/02/2011  . Rheumatoid arthritis(714.0) 02/21/1999   Past Medical History:  Diagnosis Date  . Chronic right lower quadrant pain   . DDD (degenerative disc disease), cervical    c6-7  . GERD (gastroesophageal reflux disease)    WATCHES DIET  . History of hypoglycemia   . History of kidney stones    passed spontaneously  . Hypertension   . Hypothyroidism   . OA (osteoarthritis)    knees , hips, feet  . PONV (postoperative nausea and vomiting)   . Rheumatoid arthritis(714.0) dx  2001  multiple sites   rheumotologist-  dr Molly Maduro gay w/ [facility]--- currently treated w/ oral arava and iv actemra  . SUI (stress urinary incontinence), female     No family history on file.  Past Surgical History:  Procedure Laterality Date  . D & C HYSTERECTOMY /  MYOSURE RESECTION OF POLYP  2017     dr Marcelle Overlie  . KNEE ARTHROSCOPY Right 1999  . KNEE ARTHROSCOPY WITH MEDIAL MENISECTOMY Right 02/07/2018   Procedure: KNEE ARTHROSCOPY WITH PARTIAL LATERAL MENISECTOMY;  Surgeon: Vickki Hearing, MD;  Location: [facility];  Service: Orthopedics;  Laterality: Right;  . LAPAROSCOPY N/A 02/16/2017   Procedure: LAPAROSCOPY DIAGNOSTIC;  Surgeon: Richarda Overlie, MD;  Location: [facility];  Service: Gynecology;  Laterality: N/A;  . WISDOM TOOTH EXTRACTION     Social History   Occupational History  . Not on file  Tobacco Use  . Smoking status: Never Smoker  . Smokeless tobacco: Never Used  Vaping Use  . Vaping Use: Never used  Substance and Sexual Activity  . Alcohol use: No  . Drug use: No  . Sexual activity: Yes    Birth control/protection: None

## 2020-04-12 ENCOUNTER — Telehealth: Payer: Self-pay | Admitting: Orthopedic Surgery

## 2020-04-12 NOTE — Telephone Encounter (Signed)
Thanks I called her

## 2020-04-12 NOTE — Telephone Encounter (Signed)
Amy please call patient on her cell phone she has a question for you.  She request to speak with you.

## 2020-05-17 ENCOUNTER — Ambulatory Visit: Payer: Commercial Managed Care - PPO | Admitting: Orthopedic Surgery

## 2020-06-21 ENCOUNTER — Telehealth: Payer: Self-pay | Admitting: Orthopaedic Surgery

## 2020-06-21 ENCOUNTER — Telehealth: Payer: Self-pay

## 2020-06-21 NOTE — Telephone Encounter (Signed)
Please advise 

## 2020-06-21 NOTE — Telephone Encounter (Signed)
Patient submitted medical release form, FMLA, and $25.00 check payment to [employer]. Accepted 06/21/20

## 2020-06-21 NOTE — Telephone Encounter (Signed)
Renee with Dr. Jacqualin Combes office called wanting to know if patient had any restrictions or limitations placed?  CB# [id number].  Please advise.  Thank you.

## 2020-06-21 NOTE — Telephone Encounter (Signed)
The patient does have severe arthritis with her hip and we have recommended joint replacement surgery.  Her main limitation she had the not standing for long periods of time or walking long distances until she has surgery.  She should avoid lifting anything greater than 30 pounds and should avoid squatting or crawling around.

## 2020-06-22 NOTE — Telephone Encounter (Signed)
I called to try to advise. She stated she is going to fax over paperwork for Dr. Magnus Ivan to sign and put this information on the formal paperwork. I gave her two different fax numbers

## 2020-06-22 NOTE — Telephone Encounter (Signed)
Calling now

## 2020-06-28 ENCOUNTER — Other Ambulatory Visit: Payer: Self-pay

## 2020-07-08 ENCOUNTER — Other Ambulatory Visit: Payer: Self-pay | Admitting: Physician Assistant

## 2020-07-21 ENCOUNTER — Ambulatory Visit: Payer: Commercial Managed Care - PPO | Admitting: Orthopedic Surgery

## 2020-07-22 ENCOUNTER — Other Ambulatory Visit: Payer: Self-pay

## 2020-07-22 ENCOUNTER — Encounter: Payer: Self-pay | Admitting: Orthopedic Surgery

## 2020-07-22 ENCOUNTER — Ambulatory Visit: Payer: Medicare Other | Admitting: Orthopedic Surgery

## 2020-07-22 ENCOUNTER — Ambulatory Visit: Payer: Medicare Other

## 2020-07-22 VITALS — BP 121/83 | HR 89 | Ht 66.5 in | Wt 224.0 lb

## 2020-07-22 DIAGNOSIS — G8929 Other chronic pain: Secondary | ICD-10-CM | POA: Diagnosis not present

## 2020-07-22 DIAGNOSIS — M25561 Pain in right knee: Secondary | ICD-10-CM

## 2020-07-22 NOTE — Progress Notes (Signed)
Follow-up  45 year old female with rheumatoid arthritis scheduled for right total hip with Dr. Magnus Ivan next week comes in for routine follow-up x-ray right knee complains of occasional catching and locking in medial more than lateral knee pain  Physical Exam Musculoskeletal:     Comments: Right knee slightly warm no effusion I do not detect any synovial thickening.  Jessica Pacheco has maintained good range of motion of with full extension and 125 degrees of flexion Jessica Pacheco has no instability slightly valgus knee      Images today show lateral joint space narrowing approximately 50% mild osteophyte surrounding the joint normal patellofemoral alignment  Recommend x-ray in a year

## 2020-07-22 NOTE — Patient Instructions (Addendum)
DUE TO COVID-19 ONLY ONE VISITOR IS ALLOWED TO COME WITH YOU AND STAY IN THE WAITING ROOM ONLY DURING PRE OP AND PROCEDURE DAY OF SURGERY. THE 1 VISITOR  MAY VISIT WITH YOU AFTER SURGERY IN YOUR PRIVATE ROOM DURING VISITING HOURS ONLY!  YOU NEED TO HAVE A COVID 19 TEST ON: 07/27/20 @ 12:00 PM, THIS TEST MUST BE DONE BEFORE SURGERY,  COVID TESTING SITE [address] [place] [address], IT IS ON THE RIGHT GOING OUT [address] APPROXIMATELY  2 MINUTES PAST [employer] ON THE RIGHT. ONCE YOUR COVID TEST IS COMPLETED,  PLEASE BEGIN THE QUARANTINE INSTRUCTIONS AS OUTLINED IN YOUR HANDOUT.                Jessica Pacheco   Your procedure is scheduled on: 07/30/20   Report to [facility] Main  Entrance   Report to admitting at: 7:15 AM     Call this number if you have problems the morning of surgery [phone]    Remember:  NO SOLID FOOD AFTER MIDNIGHT THE NIGHT PRIOR TO SURGERY. NOTHING BY MOUTH EXCEPT CLEAR LIQUIDS UNTIL: 6:45 AM . PLEASE FINISH ENSURE DRINK PER SURGEON ORDER  WHICH NEEDS TO BE COMPLETED AT : 6:45 AM.  CLEAR LIQUID DIET  Foods Allowed                                                                     Foods Excluded  Coffee and tea, regular and decaf                             liquids that you cannot  Plain Jell-O any favor except red or purple                                           see through such as: Fruit ices (not with fruit pulp)                                     milk, soups, orange juice  Iced Popsicles                                    All solid food Carbonated beverages, regular and diet                                    Cranberry, grape and apple juices Sports drinks like Gatorade Lightly seasoned clear broth or consume(fat free) Sugar, honey syrup  Sample Menu Breakfast                                Lunch  Supper Cranberry juice                    Beef broth                             Chicken broth Jell-O                                     Grape juice                           Apple juice Coffee or tea                        Jell-O                                      Popsicle                                                Coffee or tea                        Coffee or tea  _____________________________________________________________________   BRUSH YOUR TEETH MORNING OF SURGERY AND RINSE YOUR MOUTH OUT, NO CHEWING GUM CANDY OR MINTS.    Take these medicines the morning of surgery with A SIP OF WATER: escitalopram,amlodipine,levothyroxine,hydroxychloroquine.                               You may not have any metal on your body including hair pins and              piercings  Do not wear jewelry, make-up, lotions, powders or perfumes, deodorant             Do not wear nail polish on your fingernails.  Do not shave  48 hours prior to surgery.    Do not bring valuables to the hospital. [facility] IS NOT             RESPONSIBLE   FOR VALUABLES.  Contacts, dentures or bridgework may not be worn into surgery.  Leave suitcase in the car. After surgery it may be brought to your room.     Patients discharged the day of surgery will not be allowed to drive home. IF YOU ARE HAVING SURGERY AND GOING HOME THE SAME DAY, YOU MUST HAVE AN ADULT TO DRIVE YOU HOME AND BE WITH YOU FOR 24 HOURS. YOU MAY GO HOME BY TAXI OR UBER OR ORTHERWISE, BUT AN ADULT MUST ACCOMPANY YOU HOME AND STAY WITH YOU FOR 24 HOURS.  Name and phone number of your driver:  Special Instructions: N/A              Please read over the following fact sheets you were given: _____________________________________________________________________          [facility] - Preparing for Surgery Before surgery, you can play an important role.  Because skin is not sterile, your skin needs to be as free of germs as possible.  You  can reduce the number of germs on your skin by washing with CHG (chlorahexidine gluconate) soap  before surgery.  CHG is an antiseptic cleaner which kills germs and bonds with the skin to continue killing germs even after washing. Please DO NOT use if you have an allergy to CHG or antibacterial soaps.  If your skin becomes reddened/irritated stop using the CHG and inform your nurse when you arrive at Short Stay. Do not shave (including legs and underarms) for at least 48 hours prior to the first CHG shower.  You may shave your face/neck. Please follow these instructions carefully:  1.  Shower with CHG Soap the night before surgery and the  morning of Surgery.  2.  If you choose to wash your hair, wash your hair first as usual with your  normal  shampoo.  3.  After you shampoo, rinse your hair and body thoroughly to remove the  shampoo.                           4.  Use CHG as you would any other liquid soap.  You can apply chg directly  to the skin and wash                       Gently with a scrungie or clean washcloth.  5.  Apply the CHG Soap to your body ONLY FROM THE NECK DOWN.   Do not use on face/ open                           Wound or open sores. Avoid contact with eyes, ears mouth and genitals (private parts).                       Wash face,  Genitals (private parts) with your normal soap.             6.  Wash thoroughly, paying special attention to the area where your surgery  will be performed.  7.  Thoroughly rinse your body with warm water from the neck down.  8.  DO NOT shower/wash with your normal soap after using and rinsing off  the CHG Soap.                9.  Pat yourself dry with a clean towel.            10.  Wear clean pajamas.            11.  Place clean sheets on your bed the night of your first shower and do not  sleep with pets. Day of Surgery : Do not apply any lotions/deodorants the morning of surgery.  Please wear clean clothes to the hospital/surgery center.  FAILURE TO FOLLOW THESE INSTRUCTIONS MAY RESULT IN THE CANCELLATION OF YOUR SURGERY PATIENT  SIGNATURE_________________________________  NURSE SIGNATURE__________________________________  ________________________________________________________________________   Jessica Pacheco  An incentive spirometer is a tool that can help keep your lungs clear and active. This tool measures how well you are filling your lungs with each breath. Taking long deep breaths may help reverse or decrease the chance of developing breathing (pulmonary) problems (especially infection) following:  A long period of time when you are unable to move or be active. BEFORE THE PROCEDURE   If the spirometer includes an indicator to show your best effort, your nurse or respiratory therapist will set  it to a desired goal.  If possible, sit up straight or lean slightly forward. Try not to slouch.  Hold the incentive spirometer in an upright position. INSTRUCTIONS FOR USE  1. Sit on the edge of your bed if possible, or sit up as far as you can in bed or on a chair. 2. Hold the incentive spirometer in an upright position. 3. Breathe out normally. 4. Place the mouthpiece in your mouth and seal your lips tightly around it. 5. Breathe in slowly and as deeply as possible, raising the piston or the ball toward the top of the column. 6. Hold your breath for 3-5 seconds or for as long as possible. Allow the piston or ball to fall to the bottom of the column. 7. Remove the mouthpiece from your mouth and breathe out normally. 8. Rest for a few seconds and repeat Steps 1 through 7 at least 10 times every 1-2 hours when you are awake. Take your time and take a few normal breaths between deep breaths. 9. The spirometer may include an indicator to show your best effort. Use the indicator as a goal to work toward during each repetition. 10. After each set of 10 deep breaths, practice coughing to be sure your lungs are clear. If you have an incision (the cut made at the time of surgery), support your incision when coughing  by placing a pillow or rolled up towels firmly against it. Once you are able to get out of bed, walk around indoors and cough well. You may stop using the incentive spirometer when instructed by your caregiver.  RISKS AND COMPLICATIONS  Take your time so you do not get dizzy or light-headed.  If you are in pain, you may need to take or ask for pain medication before doing incentive spirometry. It is harder to take a deep breath if you are having pain. AFTER USE  Rest and breathe slowly and easily.  It can be helpful to keep track of a log of your progress. Your caregiver can provide you with a simple table to help with this. If you are using the spirometer at home, follow these instructions: [place] IF:   You are having difficultly using the spirometer.  You have trouble using the spirometer as often as instructed.  Your pain medication is not giving enough relief while using the spirometer.  You develop fever of 100.5 F (38.1 C) or higher. SEEK IMMEDIATE MEDICAL CARE IF:   You cough up bloody sputum that had not been present before.  You develop fever of 102 F (38.9 C) or greater.  You develop worsening pain at or near the incision site. MAKE SURE YOU:   Understand these instructions.  Will watch your condition.  Will get help right away if you are not doing well or get worse. Document Released: 06/19/2006 Document Revised: 05/01/2011 Document Reviewed: 08/20/2006 [facility] Patient Information 2014 [place], [place].   ________________________________________________________________________

## 2020-07-23 ENCOUNTER — Encounter (HOSPITAL_COMMUNITY): Payer: Self-pay

## 2020-07-23 ENCOUNTER — Other Ambulatory Visit: Payer: Self-pay

## 2020-07-23 ENCOUNTER — Encounter (HOSPITAL_COMMUNITY)
Admission: RE | Admit: 2020-07-23 | Discharge: 2020-07-23 | Disposition: A | Discharge status: Home / Self Care | Payer: Medicare Other | Source: Ambulatory Visit | Attending: Orthopaedic Surgery | Admitting: Orthopaedic Surgery

## 2020-07-23 DIAGNOSIS — Z01818 Encounter for other preprocedural examination: Secondary | ICD-10-CM | POA: Insufficient documentation

## 2020-07-23 HISTORY — DX: Anxiety disorder, unspecified: F41.9

## 2020-07-23 HISTORY — DX: Dyspnea, unspecified: R06.00

## 2020-07-23 HISTORY — DX: Depression, unspecified: F32.A

## 2020-07-23 LAB — CBC
HCT: 44 % (ref 36.0–46.0)
Hemoglobin: 14.8 g/dL (ref 12.0–15.0)
MCH: 30.6 pg (ref 26.0–34.0)
MCHC: 33.6 g/dL (ref 30.0–36.0)
MCV: 91.1 fL (ref 80.0–100.0)
Platelets: 299 10*3/uL (ref 150–400)
RBC: 4.83 MIL/uL (ref 3.87–5.11)
RDW: 11.8 % (ref 11.5–15.5)
WBC: 6.7 10*3/uL (ref 4.0–10.5)
nRBC: 0 % (ref 0.0–0.2)

## 2020-07-23 LAB — BASIC METABOLIC PANEL
Anion gap: 12 (ref 5–15)
BUN: 13 mg/dL (ref 6–20)
CO2: 24 mmol/L (ref 22–32)
Calcium: 9.2 mg/dL (ref 8.9–10.3)
Chloride: 103 mmol/L (ref 98–111)
Creatinine, Ser: 0.84 mg/dL (ref 0.44–1.00)
GFR, Estimated: >60 mL/min (ref >60)
Glucose, Bld: 102 mg/dL — ABNORMAL HIGH (ref 70–99)
Potassium: 3 mmol/L — ABNORMAL LOW (ref 3.5–5.1)
Sodium: 139 mmol/L (ref 135–145)

## 2020-07-23 LAB — SURGICAL PCR SCREEN
MRSA, PCR: NEGATIVE
Staphylococcus aureus: NEGATIVE

## 2020-07-23 NOTE — Progress Notes (Signed)
COVID Vaccine Completed: Yes Date COVID Vaccine completed: 07/24/19 COVID vaccine manufacturer:  Moderna    PCP - Dr. Carylon Perches Cardiologist -   Chest x-ray -  EKG -  Stress Test -  ECHO -  Cardiac Cath -  Pacemaker/ICD device last checked:  Sleep Study -  CPAP -   Fasting Blood Sugar -  Checks Blood Sugar _____ times a day  Blood Thinner Instructions: Aspirin Instructions: Last Dose:  Anesthesia review:   Patient denies shortness of breath, fever, cough and chest pain at PAT appointment   Patient verbalized understanding of instructions that were given to them at the PAT appointment. Patient was also instructed that they will need to review over the PAT instructions again at home before surgery.

## 2020-07-26 ENCOUNTER — Other Ambulatory Visit: Payer: Self-pay

## 2020-07-27 ENCOUNTER — Other Ambulatory Visit (HOSPITAL_COMMUNITY)
Admission: RE | Admit: 2020-07-27 | Discharge: 2020-07-27 | Disposition: A | Discharge status: Home / Self Care | Payer: Medicare Other | Source: Ambulatory Visit | Attending: Orthopaedic Surgery | Admitting: Orthopaedic Surgery

## 2020-07-27 DIAGNOSIS — Z20822 Contact with and (suspected) exposure to covid-19: Secondary | ICD-10-CM | POA: Diagnosis not present

## 2020-07-27 DIAGNOSIS — Z01812 Encounter for preprocedural laboratory examination: Secondary | ICD-10-CM | POA: Diagnosis present

## 2020-07-27 LAB — SARS CORONAVIRUS 2 (TAT 6-24 HRS): SARS Coronavirus 2: NEGATIVE

## 2020-07-29 NOTE — H&P (Signed)
TOTAL HIP ADMISSION H&P  Patient is admitted for right total hip arthroplasty.  Subjective:  Chief Complaint: right hip pain  HPI: Jessica Pacheco, 45 y.o. female, has a history of pain and functional disability in the right hip(s) due to arthritis and patient has failed non-surgical conservative treatments for greater than 12 weeks to include NSAID's and/or analgesics, flexibility and strengthening excercises, weight reduction as appropriate, and activity modification.  Onset of symptoms was gradual starting 1 years ago with gradually worsening course since that time.The patient noted no past surgery on the right hip(s).  Patient currently rates pain in the right hip at 10 out of 10 with activity. Patient has night pain, worsening of pain with activity and weight bearing, trendelenberg gait, pain that interfers with activities of daily living, and pain with passive range of motion. Patient has evidence of subchondral sclerosis, periarticular osteophytes, and joint space narrowing by imaging studies. This condition presents safety issues increasing the risk of falls.  There is no current active infection.  Patient Active Problem List   Diagnosis Date Noted   Hypertensive disorder 05/14/2019   Hypothyroidism 05/14/2019   Migraine 05/14/2019   Ovarian endometriosis 05/14/2019   S/P right knee arthroscopy 02/07/18 02/07/2018   Meniscus, lateral, derangement, right    Synovitis of right knee    Arthritis of right knee    Osteoarthritis, multiple sites 01/14/2018   Rheumatoid arthritis of multiple sites with negative rheumatoid factor (HCC) 05/08/2016   Primary osteoarthritis of both knees 08/30/2015   Primary osteoarthritis of right hip 08/30/2015   Primary osteoarthritis of left foot 06/30/2014   Change in stool 07/27/2011   Contraception 01/02/2011   Rheumatoid arthritis(714.0) 02/21/1999   Past Medical History:  Diagnosis Date   Anxiety    Chronic right lower quadrant pain    DDD  (degenerative disc disease), cervical    c6-7   Depression    Dyspnea    On exertion at times   GERD (gastroesophageal reflux disease)    WATCHES DIET   History of hypoglycemia    History of kidney stones    passed spontaneously   Hypertension    Hypothyroidism    OA (osteoarthritis)    knees , hips, feet   PONV (postoperative nausea and vomiting)    Rheumatoid arthritis(714.0) dx  2001  multiple sites   rheumotologist-  dr Molly Maduro gay w/ [facility]--- currently treated w/ oral arava and iv actemra   SUI (stress urinary incontinence), female     Past Surgical History:  Procedure Laterality Date   DILATION AND CURETTAGE OF UTERUS     KNEE ARTHROSCOPY Right 1999   KNEE ARTHROSCOPY WITH MEDIAL MENISECTOMY Right 02/07/2018   Procedure: KNEE ARTHROSCOPY WITH PARTIAL LATERAL MENISECTOMY;  Surgeon: Vickki Hearing, MD;  Location: [facility];  Service: Orthopedics;  Laterality: Right;   LAPAROSCOPY N/A 02/16/2017   Procedure: LAPAROSCOPY DIAGNOSTIC;  Surgeon: Richarda Overlie, MD;  Location: [facility];  Service: Gynecology;  Laterality: N/A;   WISDOM TOOTH EXTRACTION      No current facility-administered medications for this encounter.   Current Outpatient Medications  Medication Sig Dispense Refill Last Dose   amLODipine (NORVASC) 10 MG tablet Take 10 mg by mouth at bedtime.  4    celecoxib (CELEBREX) 100 MG capsule Take 100 mg by mouth 2 (two) times daily.  2    Cholecalciferol (VITAMIN D) 50 MCG (2000 UT) CAPS Take 2,000 Units by mouth daily.      Coenzyme Q10 (  CO Q-10) 100 MG CAPS Take 100 mg by mouth at bedtime.      hydrochlorothiazide (HYDRODIURIL) 25 MG tablet Take 25 mg by mouth daily after breakfast.      hydroxychloroquine (PLAQUENIL) 200 MG tablet Take 200 mg by mouth 2 (two) times daily.      levothyroxine (SYNTHROID) 100 MCG tablet Take 100 mcg by mouth daily before breakfast.      loratadine (CLARITIN) 10 MG tablet Take 10 mg by mouth at bedtime.       melatonin 3 MG TABS tablet Take 3 mg by mouth at bedtime.      norethindrone-ethinyl estradiol (LOESTRIN) 1-20 MG-MCG tablet Take 1 tablet by mouth at bedtime.      vitamin C (ASCORBIC ACID) 500 MG tablet Take 500 mg by mouth at bedtime.      zinc gluconate 50 MG tablet Take 50 mg by mouth at bedtime.      escitalopram (LEXAPRO) 10 MG tablet Take 10 mg by mouth daily.      MILK THISTLE PO Take 125 mg by mouth at bedtime.      No Known Allergies  Social History   Tobacco Use   Smoking status: Never   Smokeless tobacco: Never  Substance Use Topics   Alcohol use: No    No family history on file.   Review of Systems  All other systems reviewed and are negative.  Objective:  Physical Exam Vitals reviewed.  Constitutional:      Appearance: Normal appearance.  HENT:     Head: Normocephalic and atraumatic.  Eyes:     Extraocular Movements: Extraocular movements intact.     Pupils: Pupils are equal, round, and reactive to light.  Cardiovascular:     Rate and Rhythm: Normal rate and regular rhythm.     Pulses: Normal pulses.  Pulmonary:     Effort: Pulmonary effort is normal.     Breath sounds: Normal breath sounds.  Abdominal:     Palpations: Abdomen is soft.  Musculoskeletal:     Cervical back: Normal range of motion and neck supple.     Right hip: Tenderness and bony tenderness present. Decreased range of motion. Decreased strength.  Neurological:     Mental Status: She is alert and oriented to person, place, and time.  Psychiatric:        Behavior: Behavior normal.    Vital signs in last 24 hours:    Labs:   Estimated body mass index is 36.64 kg/m as calculated from the following:   Height as of 07/23/20: 5\' 6"  (1.676 m).   Weight as of 07/23/20: 103 kg.   Imaging Review Plain radiographs demonstrate severe degenerative joint disease of the right hip(s). The bone quality appears to be excellent for age and reported activity level.      Assessment/Plan:  End  stage arthritis, right hip(s)  The patient history, physical examination, clinical judgement of the provider and imaging studies are consistent with end stage degenerative joint disease of the right hip(s) and total hip arthroplasty is deemed medically necessary. The treatment options including medical management, injection therapy, arthroscopy and arthroplasty were discussed at length. The risks and benefits of total hip arthroplasty were presented and reviewed. The risks due to aseptic loosening, infection, stiffness, dislocation/subluxation,  thromboembolic complications and other imponderables were discussed.  The patient acknowledged the explanation, agreed to proceed with the plan and consent was signed. Patient is being admitted for inpatient treatment for surgery, pain control, PT, OT, prophylactic  antibiotics, VTE prophylaxis, progressive ambulation and ADL's and discharge planning.The patient is planning to be discharged home with home health services

## 2020-07-29 NOTE — Anesthesia Preprocedure Evaluation (Addendum)
Anesthesia Evaluation  Patient identified by MRN, date of birth, ID band Patient awake    Reviewed: Allergy & Precautions, NPO status , Patient's Chart, lab work & pertinent test results  History of Anesthesia Complications (+) PONV and history of anesthetic complications  Airway Mallampati: II  TM Distance: >3 FB Neck ROM: Full    Dental no notable dental hx. (+) Dental Advisory Given   Pulmonary neg pulmonary ROS,    Pulmonary exam normal        Cardiovascular hypertension, Pt. on medications Normal cardiovascular exam     Neuro/Psych PSYCHIATRIC DISORDERS Anxiety Depression negative neurological ROS     GI/Hepatic Neg liver ROS, GERD  ,  Endo/Other  Hypothyroidism   Renal/GU negative Renal ROS     Musculoskeletal negative musculoskeletal ROS (+)   Abdominal   Peds  Hematology negative hematology ROS (+)   Anesthesia Other Findings   Reproductive/Obstetrics                            Anesthesia Physical Anesthesia Plan  ASA: 2  Anesthesia Plan: Spinal   Post-op Pain Management:    Induction:   PONV Risk Score and Plan: 4 or greater and Ondansetron, Propofol infusion, Midazolam and Treatment may vary due to age or medical condition  Airway Management Planned: Natural Airway  Additional Equipment:   Intra-op Plan:   Post-operative Plan:   Informed Consent: I have reviewed the patients History and Physical, chart, labs and discussed the procedure including the risks, benefits and alternatives for the proposed anesthesia with the patient or authorized representative who has indicated his/her understanding and acceptance.     Dental advisory given  Plan Discussed with: Anesthesiologist and CRNA  Anesthesia Plan Comments:        Anesthesia Quick Evaluation

## 2020-07-30 ENCOUNTER — Ambulatory Visit (HOSPITAL_COMMUNITY): Payer: Medicare Other | Admitting: Certified Registered"

## 2020-07-30 ENCOUNTER — Ambulatory Visit (HOSPITAL_COMMUNITY): Payer: Medicare Other | Admitting: Physician Assistant

## 2020-07-30 ENCOUNTER — Observation Stay (HOSPITAL_COMMUNITY): Payer: Medicare Other

## 2020-07-30 ENCOUNTER — Encounter (HOSPITAL_COMMUNITY): Payer: Self-pay | Admitting: Orthopaedic Surgery

## 2020-07-30 ENCOUNTER — Other Ambulatory Visit: Payer: Self-pay

## 2020-07-30 ENCOUNTER — Observation Stay (HOSPITAL_COMMUNITY)
Admission: RE | Admit: 2020-07-30 | Discharge: 2020-07-31 | Disposition: A | Discharge status: Home / Self Care | Payer: Medicare Other | Source: Other Acute Inpatient Hospital | Attending: Orthopaedic Surgery | Admitting: Orthopaedic Surgery

## 2020-07-30 ENCOUNTER — Encounter (HOSPITAL_COMMUNITY)
Admission: RE | Disposition: A | Discharge status: Home / Self Care | Payer: Self-pay | Source: Other Acute Inpatient Hospital | Attending: Orthopaedic Surgery

## 2020-07-30 ENCOUNTER — Ambulatory Visit (HOSPITAL_COMMUNITY): Payer: Medicare Other

## 2020-07-30 DIAGNOSIS — Z419 Encounter for procedure for purposes other than remedying health state, unspecified: Secondary | ICD-10-CM

## 2020-07-30 DIAGNOSIS — Z96641 Presence of right artificial hip joint: Secondary | ICD-10-CM

## 2020-07-30 DIAGNOSIS — I10 Essential (primary) hypertension: Secondary | ICD-10-CM | POA: Insufficient documentation

## 2020-07-30 DIAGNOSIS — E039 Hypothyroidism, unspecified: Secondary | ICD-10-CM | POA: Insufficient documentation

## 2020-07-30 DIAGNOSIS — M1611 Unilateral primary osteoarthritis, right hip: Secondary | ICD-10-CM | POA: Diagnosis present

## 2020-07-30 DIAGNOSIS — Z79899 Other long term (current) drug therapy: Secondary | ICD-10-CM | POA: Diagnosis not present

## 2020-07-30 HISTORY — PX: TOTAL HIP ARTHROPLASTY: SHX124

## 2020-07-30 LAB — TYPE AND SCREEN
ABO/RH(D): A POS
Antibody Screen: NEGATIVE

## 2020-07-30 LAB — PREGNANCY, URINE: Preg Test, Ur: NEGATIVE

## 2020-07-30 SURGERY — ARTHROPLASTY, HIP, TOTAL, ANTERIOR APPROACH
Anesthesia: Spinal | Site: Hip | Laterality: Right

## 2020-07-30 MED ORDER — PHENYLEPHRINE HCL-NACL 10-0.9 MG/250ML-% IV SOLN
INTRAVENOUS | Status: DC | PRN
  Administered 2020-07-30: 20 ug/min via INTRAVENOUS

## 2020-07-30 MED ORDER — PROPOFOL 1000 MG/100ML IV EMUL
INTRAVENOUS | Status: AC
  Filled 2020-07-30: qty 100

## 2020-07-30 MED ORDER — PROPOFOL 10 MG/ML IV BOLUS
INTRAVENOUS | Status: DC | PRN
  Administered 2020-07-30: 20 mg via INTRAVENOUS
  Administered 2020-07-30: 10 mg via INTRAVENOUS

## 2020-07-30 MED ORDER — ONDANSETRON HCL 4 MG PO TABS: 4.0000 mg | ORAL_TABLET | Freq: Four times a day (QID) | ORAL | Status: DC | PRN

## 2020-07-30 MED ORDER — SODIUM CHLORIDE 0.9 % IR SOLN
Status: DC | PRN
Start: 2020-07-30 — End: 2020-07-30
  Administered 2020-07-30: 1000 mL

## 2020-07-30 MED ORDER — PANTOPRAZOLE SODIUM 40 MG PO TBEC
40.0000 mg | DELAYED_RELEASE_TABLET | Freq: Every day | ORAL | Status: DC
  Administered 2020-07-30 – 2020-07-31 (×2): 40 mg via ORAL
  Filled 2020-07-30 (×2): qty 1

## 2020-07-30 MED ORDER — DEXAMETHASONE SODIUM PHOSPHATE 10 MG/ML IJ SOLN
INTRAMUSCULAR | Status: DC | PRN
  Administered 2020-07-30: 8 mg via INTRAVENOUS

## 2020-07-30 MED ORDER — ALUM & MAG HYDROXIDE-SIMETH 200-200-20 MG/5ML PO SUSP: 30.0000 mL | ORAL | Status: DC | PRN

## 2020-07-30 MED ORDER — LORATADINE 10 MG PO TABS
10.0000 mg | ORAL_TABLET | Freq: Every day | ORAL | Status: DC
  Administered 2020-07-30: 10 mg via ORAL
  Filled 2020-07-30: qty 1

## 2020-07-30 MED ORDER — AMISULPRIDE (ANTIEMETIC) 5 MG/2ML IV SOLN
10.0000 mg | Freq: Once | INTRAVENOUS | Status: AC | PRN
  Administered 2020-07-30: 10 mg via INTRAVENOUS

## 2020-07-30 MED ORDER — AMISULPRIDE (ANTIEMETIC) 5 MG/2ML IV SOLN
INTRAVENOUS | Status: AC
  Filled 2020-07-30: qty 2

## 2020-07-30 MED ORDER — ZINC SULFATE 220 (50 ZN) MG PO CAPS: 220.0000 mg | ORAL_CAPSULE | Freq: Every day | ORAL | Status: DC

## 2020-07-30 MED ORDER — OXYCODONE HCL 5 MG PO TABS
5.0000 mg | ORAL_TABLET | ORAL | Status: DC | PRN
  Administered 2020-07-30 – 2020-07-31 (×4): 5 mg via ORAL
  Filled 2020-07-30 (×2): qty 1
  Filled 2020-07-30: qty 2
  Filled 2020-07-30: qty 1

## 2020-07-30 MED ORDER — DOCUSATE SODIUM 100 MG PO CAPS
100.0000 mg | ORAL_CAPSULE | Freq: Two times a day (BID) | ORAL | Status: DC
  Administered 2020-07-30 – 2020-07-31 (×2): 100 mg via ORAL
  Filled 2020-07-30 (×2): qty 1

## 2020-07-30 MED ORDER — OXYCODONE HCL 5 MG PO TABS: 10.0000 mg | ORAL_TABLET | ORAL | Status: DC | PRN

## 2020-07-30 MED ORDER — CELECOXIB 200 MG PO CAPS
200.0000 mg | ORAL_CAPSULE | Freq: Once | ORAL | Status: AC
  Administered 2020-07-30: 200 mg via ORAL
  Filled 2020-07-30: qty 1

## 2020-07-30 MED ORDER — PHENYLEPHRINE 40 MCG/ML (10ML) SYRINGE FOR IV PUSH (FOR BLOOD PRESSURE SUPPORT)
PREFILLED_SYRINGE | INTRAVENOUS | Status: DC | PRN
  Administered 2020-07-30: 40 ug via INTRAVENOUS
  Administered 2020-07-30 (×2): 60 ug via INTRAVENOUS
  Administered 2020-07-30 (×2): 40 ug via INTRAVENOUS

## 2020-07-30 MED ORDER — ASPIRIN 81 MG PO CHEW
81.0000 mg | CHEWABLE_TABLET | Freq: Two times a day (BID) | ORAL | Status: DC
  Administered 2020-07-30 – 2020-07-31 (×2): 81 mg via ORAL
  Filled 2020-07-30 (×2): qty 1

## 2020-07-30 MED ORDER — METOCLOPRAMIDE HCL 5 MG/ML IJ SOLN: 5.0000 mg | Freq: Three times a day (TID) | INTRAMUSCULAR | Status: DC | PRN

## 2020-07-30 MED ORDER — LIP MEDEX EX OINT
TOPICAL_OINTMENT | CUTANEOUS | Status: AC
  Filled 2020-07-30: qty 7

## 2020-07-30 MED ORDER — ONDANSETRON HCL 4 MG/2ML IJ SOLN: 4.0000 mg | Freq: Four times a day (QID) | INTRAMUSCULAR | Status: DC | PRN

## 2020-07-30 MED ORDER — LACTATED RINGERS IV SOLN: INTRAVENOUS | Status: DC

## 2020-07-30 MED ORDER — ACETAMINOPHEN 500 MG PO TABS
1000.0000 mg | ORAL_TABLET | Freq: Once | ORAL | Status: AC
  Administered 2020-07-30: 1000 mg via ORAL
  Filled 2020-07-30: qty 2

## 2020-07-30 MED ORDER — VITAMIN D 25 MCG (1000 UNIT) PO TABS
2000.0000 [IU] | ORAL_TABLET | Freq: Every day | ORAL | Status: DC
  Administered 2020-07-31: 2000 [IU] via ORAL
  Filled 2020-07-30: qty 2

## 2020-07-30 MED ORDER — MIDAZOLAM HCL 2 MG/2ML IJ SOLN
INTRAMUSCULAR | Status: DC | PRN
  Administered 2020-07-30: 2 mg via INTRAVENOUS

## 2020-07-30 MED ORDER — ORAL CARE MOUTH RINSE: 15.0000 mL | Freq: Once | OROMUCOSAL | Status: AC

## 2020-07-30 MED ORDER — NORETHINDRONE ACET-ETHINYL EST 1-20 MG-MCG PO TABS: 1.0000 | ORAL_TABLET | Freq: Every day | ORAL | Status: DC

## 2020-07-30 MED ORDER — ASCORBIC ACID 500 MG PO TABS
500.0000 mg | ORAL_TABLET | Freq: Every day | ORAL | Status: DC
  Administered 2020-07-30: 500 mg via ORAL
  Filled 2020-07-30: qty 1

## 2020-07-30 MED ORDER — FENTANYL CITRATE (PF) 100 MCG/2ML IJ SOLN
INTRAMUSCULAR | Status: AC
  Filled 2020-07-30: qty 2

## 2020-07-30 MED ORDER — POVIDONE-IODINE 10 % EX SWAB
2.0000 "application " | Freq: Once | CUTANEOUS | Status: AC
  Administered 2020-07-30: 2 via TOPICAL

## 2020-07-30 MED ORDER — MEPIVACAINE HCL (PF) 2 % IJ SOLN
INTRAMUSCULAR | Status: DC | PRN
  Administered 2020-07-30: 60 mg via EPIDURAL

## 2020-07-30 MED ORDER — CHLORHEXIDINE GLUCONATE 0.12 % MT SOLN
15.0000 mL | Freq: Once | OROMUCOSAL | Status: AC
  Administered 2020-07-30: 15 mL via OROMUCOSAL

## 2020-07-30 MED ORDER — LEVOTHYROXINE SODIUM 100 MCG PO TABS
100.0000 ug | ORAL_TABLET | Freq: Every day | ORAL | Status: DC
  Administered 2020-07-31: 100 ug via ORAL
  Filled 2020-07-30: qty 1

## 2020-07-30 MED ORDER — PROPOFOL 10 MG/ML IV BOLUS
INTRAVENOUS | Status: AC
  Filled 2020-07-30: qty 20

## 2020-07-30 MED ORDER — DEXAMETHASONE SODIUM PHOSPHATE 10 MG/ML IJ SOLN
INTRAMUSCULAR | Status: AC
  Filled 2020-07-30: qty 1

## 2020-07-30 MED ORDER — DIPHENHYDRAMINE HCL 12.5 MG/5ML PO ELIX: 12.5000 mg | ORAL_SOLUTION | ORAL | Status: DC | PRN

## 2020-07-30 MED ORDER — CEFAZOLIN SODIUM-DEXTROSE 2-4 GM/100ML-% IV SOLN
2.0000 g | INTRAVENOUS | Status: AC
  Administered 2020-07-30: 2 g via INTRAVENOUS
  Filled 2020-07-30: qty 100

## 2020-07-30 MED ORDER — HYDROMORPHONE HCL 1 MG/ML IJ SOLN: 0.5000 mg | INTRAMUSCULAR | Status: DC | PRN

## 2020-07-30 MED ORDER — METHOCARBAMOL 500 MG IVPB - SIMPLE MED
500.0000 mg | Freq: Four times a day (QID) | INTRAVENOUS | Status: DC | PRN
  Administered 2020-07-30: 500 mg via INTRAVENOUS
  Filled 2020-07-30: qty 50

## 2020-07-30 MED ORDER — METHOCARBAMOL 500 MG PO TABS
500.0000 mg | ORAL_TABLET | Freq: Four times a day (QID) | ORAL | Status: DC | PRN
  Administered 2020-07-30 – 2020-07-31 (×3): 500 mg via ORAL
  Filled 2020-07-30 (×3): qty 1

## 2020-07-30 MED ORDER — HYDROCHLOROTHIAZIDE 25 MG PO TABS
25.0000 mg | ORAL_TABLET | Freq: Every day | ORAL | Status: DC
  Administered 2020-07-31: 25 mg via ORAL
  Filled 2020-07-30: qty 1

## 2020-07-30 MED ORDER — SODIUM CHLORIDE 0.9 % IV SOLN: INTRAVENOUS | Status: DC

## 2020-07-30 MED ORDER — PROMETHAZINE HCL 25 MG/ML IJ SOLN
6.2500 mg | INTRAMUSCULAR | Status: AC | PRN
  Administered 2020-07-30: 6.25 mg via INTRAVENOUS

## 2020-07-30 MED ORDER — FENTANYL CITRATE (PF) 100 MCG/2ML IJ SOLN
INTRAMUSCULAR | Status: DC | PRN
  Administered 2020-07-30 (×2): 25 ug via INTRAVENOUS
  Administered 2020-07-30: 50 ug via INTRAVENOUS

## 2020-07-30 MED ORDER — 0.9 % SODIUM CHLORIDE (POUR BTL) OPTIME
TOPICAL | Status: DC | PRN
  Administered 2020-07-30: 1000 mL

## 2020-07-30 MED ORDER — ESCITALOPRAM OXALATE 10 MG PO TABS
10.0000 mg | ORAL_TABLET | Freq: Every day | ORAL | Status: DC
  Administered 2020-07-30 – 2020-07-31 (×2): 10 mg via ORAL
  Filled 2020-07-30 (×2): qty 1

## 2020-07-30 MED ORDER — AMLODIPINE BESYLATE 10 MG PO TABS: 10.0000 mg | ORAL_TABLET | Freq: Every day | ORAL | Status: DC

## 2020-07-30 MED ORDER — PHENOL 1.4 % MT LIQD: 1.0000 | OROMUCOSAL | Status: DC | PRN

## 2020-07-30 MED ORDER — PROMETHAZINE HCL 25 MG/ML IJ SOLN
INTRAMUSCULAR | Status: AC
  Administered 2020-07-30: 6.25 mg via INTRAVENOUS
  Filled 2020-07-30: qty 1

## 2020-07-30 MED ORDER — MENTHOL 3 MG MT LOZG: 1.0000 | LOZENGE | OROMUCOSAL | Status: DC | PRN

## 2020-07-30 MED ORDER — PROPOFOL 500 MG/50ML IV EMUL
INTRAVENOUS | Status: DC | PRN
  Administered 2020-07-30: 100 ug/kg/min via INTRAVENOUS

## 2020-07-30 MED ORDER — MIDAZOLAM HCL 2 MG/2ML IJ SOLN
INTRAMUSCULAR | Status: AC
  Filled 2020-07-30: qty 2

## 2020-07-30 MED ORDER — ONDANSETRON HCL 4 MG/2ML IJ SOLN
INTRAMUSCULAR | Status: DC | PRN
  Administered 2020-07-30: 4 mg via INTRAVENOUS

## 2020-07-30 MED ORDER — TRANEXAMIC ACID-NACL 1000-0.7 MG/100ML-% IV SOLN
INTRAVENOUS | Status: AC
  Filled 2020-07-30: qty 100

## 2020-07-30 MED ORDER — VITAMIN D 50 MCG (2000 UT) PO CAPS: 2000.0000 [IU] | ORAL_CAPSULE | Freq: Every day | ORAL | Status: DC

## 2020-07-30 MED ORDER — TRANEXAMIC ACID-NACL 1000-0.7 MG/100ML-% IV SOLN
INTRAVENOUS | Status: DC | PRN
  Administered 2020-07-30: 1000 mg via INTRAVENOUS

## 2020-07-30 MED ORDER — METOCLOPRAMIDE HCL 5 MG PO TABS: 5.0000 mg | ORAL_TABLET | Freq: Three times a day (TID) | ORAL | Status: DC | PRN

## 2020-07-30 MED ORDER — METHOCARBAMOL 500 MG IVPB - SIMPLE MED
INTRAVENOUS | Status: AC
  Filled 2020-07-30: qty 50

## 2020-07-30 MED ORDER — FENTANYL CITRATE (PF) 100 MCG/2ML IJ SOLN
25.0000 ug | INTRAMUSCULAR | Status: DC | PRN
  Administered 2020-07-30 (×2): 50 ug via INTRAVENOUS

## 2020-07-30 MED ORDER — LIDOCAINE 2% (20 MG/ML) 5 ML SYRINGE
INTRAMUSCULAR | Status: AC
  Filled 2020-07-30: qty 5

## 2020-07-30 MED ORDER — ZINC GLUCONATE 50 MG PO TABS: 50.0000 mg | ORAL_TABLET | Freq: Every day | ORAL | Status: DC

## 2020-07-30 MED ORDER — CEFAZOLIN SODIUM-DEXTROSE 1-4 GM/50ML-% IV SOLN
1.0000 g | Freq: Four times a day (QID) | INTRAVENOUS | Status: AC
  Administered 2020-07-30 (×2): 1 g via INTRAVENOUS
  Filled 2020-07-30 (×2): qty 50

## 2020-07-30 MED ORDER — ONDANSETRON HCL 4 MG/2ML IJ SOLN
INTRAMUSCULAR | Status: AC
  Filled 2020-07-30: qty 2

## 2020-07-30 MED ORDER — ACETAMINOPHEN 325 MG PO TABS
325.0000 mg | ORAL_TABLET | Freq: Four times a day (QID) | ORAL | Status: DC | PRN
Start: 2020-07-31 — End: 2020-07-31
  Administered 2020-07-31: 650 mg via ORAL
  Filled 2020-07-30: qty 2

## 2020-07-30 MED ORDER — HYDROXYCHLOROQUINE SULFATE 200 MG PO TABS
200.0000 mg | ORAL_TABLET | Freq: Two times a day (BID) | ORAL | Status: DC
  Administered 2020-07-30 – 2020-07-31 (×2): 200 mg via ORAL
  Filled 2020-07-30 (×2): qty 1

## 2020-07-30 SURGICAL SUPPLY — 33 items
ACETAB CUP W GRIPTION 54MM (Plate) ×1 IMPLANT
ACETAB CUP W/GRIPTION 54 (Plate) ×2 IMPLANT
BLADE SAW SGTL 18X1.27X75 (BLADE) ×2 IMPLANT
BLADE SAW SGTL 18X1.27X75MM (BLADE) ×1
COVER PERINEAL POST (MISCELLANEOUS) ×3 IMPLANT
COVER SURGICAL LIGHT HANDLE (MISCELLANEOUS) ×3 IMPLANT
CUP ACETAB W/GRIPTION 54 (Plate) ×1 IMPLANT
DRAPE STERI IOBAN 125X83 (DRAPES) ×3 IMPLANT
DRAPE U-SHAPE 47X51 STRL (DRAPES) ×6 IMPLANT
DRSG AQUACEL AG ADV 3.5X10 (GAUZE/BANDAGES/DRESSINGS) ×3 IMPLANT
DURAPREP 26ML APPLICATOR (WOUND CARE) ×3 IMPLANT
ELECT REM PT RETURN 15FT ADLT (MISCELLANEOUS) ×3 IMPLANT
GLOVE SRG 8 PF TXTR STRL LF DI (GLOVE) ×2 IMPLANT
GLOVE SURG ENC MOIS LTX SZ7.5 (GLOVE) ×3 IMPLANT
GLOVE SURG LTX SZ8 (GLOVE) ×3 IMPLANT
GLOVE SURG UNDER POLY LF SZ8 (GLOVE) ×6
GOWN STRL REUS W/TWL XL LVL3 (GOWN DISPOSABLE) ×6 IMPLANT
HANDPIECE INTERPULSE COAX TIP (DISPOSABLE) ×2
HEAD CERAMIC 36 PLUS5 (Hips) ×3 IMPLANT
HOLDER FOLEY CATH W/STRAP (MISCELLANEOUS) ×3 IMPLANT
KIT TURNOVER KIT A (KITS) ×3 IMPLANT
LINER NEUTRAL 36ID 54OD (Liner) ×3 IMPLANT
PACK ANTERIOR HIP CUSTOM (KITS) ×3 IMPLANT
PENCIL SMOKE EVACUATOR (MISCELLANEOUS) ×3 IMPLANT
SCREW 6.5MMX25MM (Screw) ×3 IMPLANT
SET HNDPC FAN SPRY TIP SCT (DISPOSABLE) ×1 IMPLANT
STEM CORAIL KA10 (Stem) ×3 IMPLANT
SUT ETHILON 2 0 PS N (SUTURE) IMPLANT
SUT VIC AB 0 CT1 36 (SUTURE) ×3 IMPLANT
SUT VIC AB 1 CT1 36 (SUTURE) ×3 IMPLANT
SUT VIC AB 2-0 CT1 27 (SUTURE) ×6
SUT VIC AB 2-0 CT1 TAPERPNT 27 (SUTURE) ×2 IMPLANT
TRAY FOLEY MTR SLVR 16FR STAT (SET/KITS/TRAYS/PACK) IMPLANT

## 2020-07-30 NOTE — Interval H&P Note (Signed)
History and Physical Interval Note: The patient understands fully that she is here today for a right total hip replacement to treat her right hip osteoarthritis.  There has been no acute or interval change in her medical status.  Please see recent H&P.  The risks and benefits of surgery have been described in detail and informed consent is obtained.  The right hip has been marked.  07/30/2020 8:32 AM  Jessica Pacheco  has presented today for surgery, with the diagnosis of Osteoarthritis Right Hip.  The various methods of treatment have been discussed with the patient and family. After consideration of risks, benefits and other options for treatment, the patient has consented to  Procedure(s): RIGHT TOTAL HIP ARTHROPLASTY ANTERIOR APPROACH (Right) as a surgical intervention.  The patient's history has been reviewed, patient examined, no change in status, stable for surgery.  I have reviewed the patient's chart and labs.  Questions were answered to the patient's satisfaction.     Kathryne Hitch

## 2020-07-30 NOTE — Anesthesia Procedure Notes (Signed)
Spinal  Patient location during procedure: OR Start time: 07/30/2020 9:42 AM End time: 07/30/2020 9:52 AM Reason for block: surgical anesthesia Staffing Performed: anesthesiologist  Anesthesiologist: Heather Roberts, MD Preanesthetic Checklist Completed: patient identified, IV checked, risks and benefits discussed, surgical consent, monitors and equipment checked, pre-op evaluation and timeout performed Spinal Block Patient position: sitting Prep: DuraPrep Patient monitoring: cardiac monitor, continuous pulse ox and blood pressure Approach: midline Injection technique: single-shot Needle Needle type: Pencan  Needle gauge: 24 G Needle length: 9 cm Assessment Events: CSF return Additional Notes Functioning IV was confirmed and monitors were applied. Sterile prep and drape, including hand hygiene and sterile gloves were used. The patient was positioned and the spine was prepped. The skin was anesthetized with lidocaine.  Free flow of clear CSF was obtained prior to injecting local anesthetic into the CSF.  The spinal needle aspirated freely following injection.  The needle was carefully withdrawn.  The patient tolerated the procedure well.

## 2020-07-30 NOTE — Op Note (Signed)
NAME: Jessica Pacheco, HANSELL MEDICAL RECORD NO: [id number] ACCOUNT NO: [id number] DATE OF BIRTH: January 12, 1976 FACILITY: [facility] LOCATION: [id number] PHYSICIAN: Vanita Panda. Magnus Ivan, MD  Operative Report   DATE OF PROCEDURE: 07/30/2020  PREOPERATIVE DIAGNOSIS:  Primary osteoarthritis, degenerative joint disease, right hip.  POSTOPERATIVE DIAGNOSIS:  Primary osteoarthritis, degenerative joint disease, right hip.  PROCEDURE:  Right total hip arthroplasty through direct anterior approach.  IMPLANTS:  DePuy Sector Gription acetabular component size 54, size 36+0 neutral polyethylene liner, size 10 Corail femoral component with standard offset, size 36+5 ceramic hip ball.  SURGEON:  Vanita Panda. Magnus Ivan, MD  ASSISTANT:  Richardean Canal, PA-C.  ANESTHESIA:  Spinal.  ANTIBIOTICS:  2 grams IV Ancef.  ESTIMATED BLOOD LOSS:  200 mL  COMPLICATIONS:  None.  INDICATIONS:  The patient is a 45 year old female well known to me.  She unfortunately has debilitating arthritis involving her right hip that has been well documented with x-rays and clinical exam.  She has tried and failed all forms of conservative  treatment.  Her right hip pain is daily and it is 10/10.  It is detrimentally affecting her mobility, her quality of life, and her activities of daily living to the point she does wish to proceed with a total hip arthroplasty.  We described in detail  what the surgery involves.  We talked about the risks and benefits of surgery including the risk of acute blood loss anemia, nerve or vessel injury, fracture, infection, dislocation, DVT and implant failure and skin and soft tissue issues.  Given her  morbid obesity these are certainly high risk and she understands that.  She understands our goals are to decrease pain, improve mobility and overall improve quality of life.  DESCRIPTION OF PROCEDURE:  After informed consent was obtained, appropriate right hip was marked.  She was brought to the operating room  and sat up on the stretcher where spinal anesthesia was obtained, she was laid in supine position on a stretcher.   Foley catheter was placed.  I was able to assess her leg lengths and then traction boots were placed on both her feet.  Next, she was placed supine on the Hana fracture table, the perineal post in place and both legs in line skeletal traction devices.   No traction applied.  Her right operative hip was prepped and draped in DuraPrep and sterile drapes.  A timeout was called and she was identified correct patient, correct right hip.  I then made an incision just inferior and posterior to the anterior  superior iliac spine and carried this obliquely down the leg.  We dissected down tensor fascia lata muscle.  Tensor fascia was then divided longitudinally to proceed with a direct anterior approach to the hip.  We identified and cauterized circumflex  vessels, we then identified the hip capsule, opened the hip capsule in L-type format finding moderate joint effusion and significant periarticular osteophytes around the lateral femoral head and neck.  We placed Cobra retractors around the medial and  lateral femoral neck and made a femoral neck cut with an oscillating saw just proximal to the lesser trochanter and completed this with osteotome, placed a corkscrew guide in the femoral head and removed the femoral heads in its entirety and found a wide  area devoid of cartilage.  We removed periarticular osteophytes around the acetabulum and remnants of the acetabular labrum.  I placed a bent Hohmann over the medial acetabular rim and then began reaming from a size 43 reamer in stepwise increments  going up to a size 53 with all reamers under direct visualization.  Last reamer under direct fluoroscopy, so we could obtain our depth of reaming our inclination and anteversion.  I then placed the real DePuy Sector Gription acetabular component size 54  and a single screw and a 36+0 neutral polyethylene  liner.  Attention was then turned to the femur with the leg externally rotated to 120 degrees, extended and adducted, we were able to place a Mueller retractor medially and Hohman retractor above the  greater trochanter.  We released lateral joint capsule and used a box cutting osteotome to enter femoral canal and a rongeur to lateralize.  We then began broaching using Corail broaching system from a size 8 going up to a size 10.  I tried to go to an  32, but the 110 which is way too proud I could not get it to fit so the size 10 was a better fit.  We trialed standard offset femoral neck and a 36+15 hip ball and reduced this in acetabulum.  Based on her radiographs and clinical exam with the trial  instrumentation we needed a size 10 stem looked appropriate, but we wanted to go up to ball size on leg length.  We dislocated the hip, removed the trial components.  We placed the real Corail femoral component, size 10 with standard offset with a real  36+5 ceramic hip ball and again reduced this in the acetabulum.  We were pleased with increasing her offset and leg length.  We assessed this mechanically and radiographically and felt stable.  We then irrigated the soft tissue with normal saline  solution using pulsatile lavage.  We closed the joint capsule with interrupted #1 Ethibond suture followed by #1 Vicryl to close the tensor fascia.  0 Vicryl was used to close deep tissue and 2-0 Vicryl was used to close subcutaneous tissue.  The skin  was reapproximated with staples.  Aquacel dressing was applied.  She was taken off the Hana table and taken to recovery room in stable condition with all final counts being correct.  No complications noted.  Note, Rexene Edison, PA-C did assist during the  entire case and his assistance was crucial for facilitating all aspects of this case.   PUS D: 07/30/2020 11:07:58 am T: 07/30/2020 1:56:00 pm  JOB: [id number]/ [id number]

## 2020-07-30 NOTE — Plan of Care (Signed)

## 2020-07-30 NOTE — Anesthesia Procedure Notes (Signed)
Procedure Name: MAC Date/Time: 07/30/2020 9:46 AM Performed by: Eben Burow, CRNA Pre-anesthesia Checklist: Patient identified, Emergency Drugs available, Suction available, Patient being monitored and Timeout performed Oxygen Delivery Method: Simple face mask Placement Confirmation: positive ETCO2

## 2020-07-30 NOTE — Brief Op Note (Signed)
07/30/2020  11:09 AM  PATIENT:  Jessica Pacheco  45 y.o. female  PRE-OPERATIVE DIAGNOSIS:  Osteoarthritis Right Hip  POST-OPERATIVE DIAGNOSIS:  Osteoarthritis Right Hip  PROCEDURE:  Procedure(s): RIGHT TOTAL HIP ARTHROPLASTY ANTERIOR APPROACH (Right)  SURGEON:  Surgeon(s) and Role:    Kathryne Hitch, MD - Primary  PHYSICIAN ASSISTANT:  Rexene Edison, PA-C  ANESTHESIA:   spinal  EBL:  150 mL   COUNTS:  YES  TOURNIQUET:  * No tourniquets in log *  DICTATION: .Other Dictation: Dictation Number [id number]  PLAN OF CARE: Admit for overnight observation  PATIENT DISPOSITION:  PACU - hemodynamically stable.   Delay start of Pharmacological VTE agent (>24hrs) due to surgical blood loss or risk of bleeding: no

## 2020-07-30 NOTE — Anesthesia Postprocedure Evaluation (Signed)
Anesthesia Post Note  Patient: Jessica Pacheco  Procedure(s) Performed: RIGHT TOTAL HIP ARTHROPLASTY ANTERIOR APPROACH (Right: Hip)     Patient location during evaluation: PACU Anesthesia Type: Spinal Level of consciousness: sedated Pain management: pain level controlled Vital Signs Assessment: post-procedure vital signs reviewed and stable Respiratory status: spontaneous breathing and respiratory function stable Cardiovascular status: stable Postop Assessment: no apparent nausea or vomiting Anesthetic complications: no   No notable events documented.  Last Vitals:  Vitals:   07/30/20 1330 07/30/20 1356  BP: 130/82 134/80  Pulse: 77 75  Resp: 16 16  Temp: 36.7 C (!) 36.4 C  SpO2: 97% 100%    Last Pain:  Vitals:   07/30/20 1426  TempSrc:   PainSc: 5                  Jaren Kearn DANIEL

## 2020-07-30 NOTE — Transfer of Care (Signed)
Immediate Anesthesia Transfer of Care Note  Patient: Jessica Pacheco  Procedure(s) Performed: RIGHT TOTAL HIP ARTHROPLASTY ANTERIOR APPROACH (Right: Hip)  Patient Location: PACU  Anesthesia Type:Spinal  Level of Consciousness: awake, alert  and patient cooperative  Airway & Oxygen Therapy: Patient Spontanous Breathing and Patient connected to face mask oxygen  Post-op Assessment: Report given to RN and Post -op Vital signs reviewed and stable  Post vital signs: Reviewed and stable  Last Vitals:  Vitals Value Taken Time  BP 121/80 07/30/20 1130  Temp    Pulse 70 07/30/20 1130  Resp 11 07/30/20 1130  SpO2 98 % 07/30/20 1130  Vitals shown include unvalidated device data.  Last Pain:  Vitals:   07/30/20 0747  TempSrc:   PainSc: 4       Patients Stated Pain Goal: 3 (07/30/20 0747)  Complications: No notable events documented.

## 2020-07-31 DIAGNOSIS — M1611 Unilateral primary osteoarthritis, right hip: Secondary | ICD-10-CM | POA: Diagnosis not present

## 2020-07-31 LAB — BASIC METABOLIC PANEL
Anion gap: 11 (ref 5–15)
BUN: 11 mg/dL (ref 6–20)
CO2: 21 mmol/L — ABNORMAL LOW (ref 22–32)
Calcium: 8.6 mg/dL — ABNORMAL LOW (ref 8.9–10.3)
Chloride: 109 mmol/L (ref 98–111)
Creatinine, Ser: 0.72 mg/dL (ref 0.44–1.00)
GFR, Estimated: >60 mL/min (ref >60)
Glucose, Bld: 178 mg/dL — ABNORMAL HIGH (ref 70–99)
Potassium: 3.4 mmol/L — ABNORMAL LOW (ref 3.5–5.1)
Sodium: 141 mmol/L (ref 135–145)

## 2020-07-31 LAB — CBC
HCT: 36 % (ref 36.0–46.0)
Hemoglobin: 12.2 g/dL (ref 12.0–15.0)
MCH: 31.8 pg (ref 26.0–34.0)
MCHC: 33.9 g/dL (ref 30.0–36.0)
MCV: 93.8 fL (ref 80.0–100.0)
Platelets: 251 10*3/uL (ref 150–400)
RBC: 3.84 MIL/uL — ABNORMAL LOW (ref 3.87–5.11)
RDW: 11.7 % (ref 11.5–15.5)
WBC: 12.4 10*3/uL — ABNORMAL HIGH (ref 4.0–10.5)
nRBC: 0 % (ref 0.0–0.2)

## 2020-07-31 MED ORDER — METHOCARBAMOL 500 MG PO TABS: 500.0000 mg | ORAL_TABLET | Freq: Four times a day (QID) | ORAL | 1 refills | Status: DC | PRN

## 2020-07-31 MED ORDER — OXYCODONE HCL 5 MG PO TABS: 5.0000 mg | ORAL_TABLET | ORAL | 0 refills | Status: DC | PRN

## 2020-07-31 MED ORDER — ASPIRIN 81 MG PO CHEW: 81.0000 mg | CHEWABLE_TABLET | Freq: Two times a day (BID) | ORAL | 0 refills | Status: DC

## 2020-07-31 NOTE — TOC Transition Note (Signed)
Transition of Care [facility]) - CM/SW Discharge Note   Patient Details  Name: Jessica Pacheco MRN: [id number] Date of Birth: 1975/06/17  [facility] CM/SW Contact:  Golda Acre, RN Phone Number: 07/31/2020, 9:40 AM   Clinical Narrative:     Dme ordered as below        Patient Goals and CMS Choice        Discharge Placement                       Discharge Plan and Services   Discharge Planning Services: CM Consult            DME Arranged: Dan Humphreys rolling, 3-N-1 DME Agency: [employer] Date DME Agency Contacted: 07/31/20 Time DME Agency Contacted: [phone] Representative spoke with at DME Agency: [email or web address]            Social Determinants of Health (SDOH) Interventions     Readmission Risk Interventions No flowsheet data found.

## 2020-07-31 NOTE — Progress Notes (Signed)
Physical Therapy Treatment Patient Details Name: Jessica Pacheco MRN: [id number] DOB: [birth year] Today's Date: 07/31/2020    History of Present Illness Pt s/p R THR and with hx of DDD and RA    PT Comments    Pt motivated and progressing well.  Pt up to ambulate in hall, negotiated stairs, reviewed car transfers, and reviewed written HEP.  Pt eager for dc home this date.   Follow Up Recommendations  Home health PT;Follow surgeon's recommendation for DC plan and follow-up therapies     Equipment Recommendations  Rolling walker with 5" wheels;3in1 (PT)    Recommendations for Other Services       Precautions / Restrictions Precautions Precautions: Fall Restrictions Weight Bearing Restrictions: No RLE Weight Bearing: Weight bearing as tolerated    Mobility  Bed Mobility Overal bed mobility: Needs Assistance Bed Mobility: Supine to Sit     Supine to sit: Min guard     General bed mobility comments: Up in chair and requests back to same    Transfers Overall transfer level: Needs assistance Equipment used: Rolling walker (2 wheeled) Transfers: Sit to/from Stand Sit to Stand: Min guard;Supervision         General transfer comment: cues for LE management and use of UEs to self assist  Ambulation/Gait Ambulation/Gait assistance: Min guard;Supervision Gait Distance (Feet): 140 Feet Assistive device: Rolling walker (2 wheeled) Gait Pattern/deviations: Step-to pattern;Step-through pattern;Decreased step length - right;Decreased step length - left;Shuffle;Trunk flexed     General Gait Details: cues for sequence, posture and position from RW   Stairs Stairs: Yes Stairs assistance: Min assist Stair Management: No rails;Step to pattern;Forwards;Backwards;With walker Number of Stairs: 5 General stair comments: single step bkwd for stool into bed; 2 step fwd with RW at top; 2 step bkwd with RW; cues for sequence and foot/RW placement   Wheelchair Mobility     Modified Rankin (Stroke Patients Only)       Balance Overall balance assessment: Needs assistance Sitting-balance support: No upper extremity supported;Feet supported Sitting balance-Leahy Scale: Good     Standing balance support: Bilateral upper extremity supported Standing balance-Leahy Scale: Fair                              Cognition Arousal/Alertness: Awake/alert Behavior During Therapy: WFL for tasks assessed/performed Overall Cognitive Status: Within Functional Limits for tasks assessed                                        Exercises Total Joint Exercises Ankle Circles/Pumps: AROM;Both;15 reps;Supine Quad Sets: AROM;Both;10 reps;Supine Heel Slides: AAROM;Right;20 reps;Supine Hip ABduction/ADduction: AAROM;Right;15 reps;Supine    General Comments        Pertinent Vitals/Pain Pain Assessment: 0-10 Pain Score: 5  Pain Location: R hip Pain Descriptors / Indicators: Aching;Burning Pain Intervention(s): Limited activity within patient's tolerance;Monitored during session;Premedicated before session    Home Living Family/patient expects to be discharged to:: Private residence Living Arrangements: Spouse/significant other;Children Available Help at Discharge: Family Type of Home: House Home Access: Stairs to enter Entrance Stairs-Rails: None Home Layout: One level Home Equipment: None      Prior Function Level of Independence: Independent          PT Goals (current goals can now be found in the care plan section) Acute Rehab PT Goals Patient Stated Goal: Regain IND PT Goal Formulation: With  patient Time For Goal Achievement: 08/14/20 Potential to Achieve Goals: Good Progress towards PT goals: Progressing toward goals    Frequency    7X/week      PT Plan Current plan remains appropriate    Co-evaluation              AM-PAC PT "6 Clicks" Mobility   Outcome Measure  Help needed turning from your back to  your side while in a flat bed without using bedrails?: A Little Help needed moving from lying on your back to sitting on the side of a flat bed without using bedrails?: A Little Help needed moving to and from a bed to a chair (including a wheelchair)?: A Little Help needed standing up from a chair using your arms (e.g., wheelchair or bedside chair)?: A Little Help needed to walk in hospital room?: A Little Help needed climbing 3-5 steps with a railing? : A Little 6 Click Score: 18    End of Session Equipment Utilized During Treatment: Gait belt Activity Tolerance: Patient tolerated treatment well Patient left: in chair;with call bell/phone within reach Nurse Communication: Mobility status PT Visit Diagnosis: Difficulty in walking, not elsewhere classified (R26.2)     Time: 1202-1222 PT Time Calculation (min) (ACUTE ONLY): 20 min  Charges:  $Therapeutic Exercise: 8-22 mins $Therapeutic Activity: 8-22 mins                     Jessica Pacheco PT Acute Rehabilitation Services Pager [phone] Office [phone]    Jessica Pacheco 07/31/2020, 12:29 PM

## 2020-07-31 NOTE — Discharge Instructions (Signed)

## 2020-07-31 NOTE — Discharge Summary (Signed)
Patient ID: Jessica Pacheco MRN: [id number] DOB/AGE: [birth year] 45 y.o.  Admit date: 07/30/2020 Discharge date: 07/31/2020  Admission Diagnoses:  Principal Problem:   Primary osteoarthritis of right hip Active Problems:   Status post total replacement of right hip   Discharge Diagnoses:  Same  Past Medical History:  Diagnosis Date   Anxiety    Chronic right lower quadrant pain    DDD (degenerative disc disease), cervical    c6-7   Depression    Dyspnea    On exertion at times   GERD (gastroesophageal reflux disease)    WATCHES DIET   History of hypoglycemia    History of kidney stones    passed spontaneously   Hypertension    Hypothyroidism    OA (osteoarthritis)    knees , hips, feet   PONV (postoperative nausea and vomiting)    Rheumatoid arthritis(714.0) dx  2001  multiple sites   rheumotologist-  dr Molly Maduro gay w/ [facility]--- currently treated w/ oral arava and iv actemra   SUI (stress urinary incontinence), female     Surgeries: Procedure(s): RIGHT TOTAL HIP ARTHROPLASTY ANTERIOR APPROACH on 07/30/2020   Consultants:   Discharged Condition: Improved  Hospital Course: Jessica Pacheco is an 45 y.o. female who was admitted 07/30/2020 for operative treatment ofPrimary osteoarthritis of right hip. Patient has severe unremitting pain that affects sleep, daily activities, and work/hobbies. After pre-op clearance the patient was taken to the operating room on 07/30/2020 and underwent  Procedure(s): RIGHT TOTAL HIP ARTHROPLASTY ANTERIOR APPROACH.    Patient was given perioperative antibiotics:  Anti-infectives (From admission, onward)    Start     Dose/Rate Route Frequency Ordered Stop   07/30/20 2200  hydroxychloroquine (PLAQUENIL) tablet 200 mg        200 mg Oral 2 times daily 07/30/20 1354     07/30/20 1600  ceFAZolin (ANCEF) IVPB 1 g/50 mL premix        1 g 100 mL/hr over 30 Minutes Intravenous Every 6 hours 07/30/20 1354 07/30/20 2304   07/30/20 0730  ceFAZolin  (ANCEF) IVPB 2g/100 mL premix        2 g 200 mL/hr over 30 Minutes Intravenous On call to O.R. 07/30/20 6295 07/30/20 2841        Patient was given sequential compression devices, early ambulation, and chemoprophylaxis to prevent DVT.  Patient benefited maximally from hospital stay and there were no complications.    Recent vital signs: Patient Vitals for the past 24 hrs:  BP Temp Temp src Pulse Resp SpO2  07/31/20 0930 (!) 142/83 99.1 F (37.3 C) -- 80 18 100 %  07/31/20 0636 129/81 98.1 F (36.7 C) Oral 69 16 100 %  07/31/20 0116 132/88 98 F (36.7 C) Oral 70 16 97 %  07/30/20 2110 (!) 144/86 98.2 F (36.8 C) Oral 90 18 96 %  07/30/20 1754 126/69 98 F (36.7 C) Oral 72 20 97 %  07/30/20 1617 129/77 98.3 F (36.8 C) Oral 78 16 98 %  07/30/20 1356 134/80 (!) 97.5 F (36.4 C) Oral 75 16 100 %  07/30/20 1330 130/82 98 F (36.7 C) -- 77 16 97 %  07/30/20 1315 110/72 -- -- 77 15 97 %  07/30/20 1300 113/70 -- -- 78 13 99 %  07/30/20 1245 103/70 -- -- 69 12 97 %  07/30/20 1230 113/75 -- -- 70 17 95 %  07/30/20 1215 123/77 -- -- 80 16 96 %  07/30/20 1200 121/82 -- -- 71  17 96 %  07/30/20 1158 -- -- -- 63 18 94 %  07/30/20 1145 138/77 -- -- 73 12 97 %  07/30/20 1130 121/80 98.2 F (36.8 C) -- 67 (!) 8 100 %     Recent laboratory studies:  Recent Labs    07/31/20 0301  WBC 12.4*  HGB 12.2  HCT 36.0  PLT 251  NA 141  K 3.4*  CL 109  CO2 21*  BUN 11  CREATININE 0.72  GLUCOSE 178*  CALCIUM 8.6*     Discharge Medications:   Allergies as of 07/31/2020   No Known Allergies      Medication List     TAKE these medications    amLODipine 10 MG tablet Commonly known as: NORVASC Take 10 mg by mouth at bedtime.   aspirin 81 MG chewable tablet Chew 1 tablet (81 mg total) by mouth 2 (two) times daily.   celecoxib 100 MG capsule Commonly known as: CELEBREX Take 100 mg by mouth 2 (two) times daily.   Co Q-10 100 MG Caps Take 100 mg by mouth at bedtime.    escitalopram 10 MG tablet Commonly known as: LEXAPRO Take 10 mg by mouth daily.   hydrochlorothiazide 25 MG tablet Commonly known as: HYDRODIURIL Take 25 mg by mouth daily after breakfast.   hydroxychloroquine 200 MG tablet Commonly known as: PLAQUENIL Take 200 mg by mouth 2 (two) times daily.   levothyroxine 100 MCG tablet Commonly known as: SYNTHROID Take 100 mcg by mouth daily before breakfast.   loratadine 10 MG tablet Commonly known as: CLARITIN Take 10 mg by mouth at bedtime.   melatonin 3 MG Tabs tablet Take 3 mg by mouth at bedtime.   methocarbamol 500 MG tablet Commonly known as: ROBAXIN Take 1 tablet (500 mg total) by mouth every 6 (six) hours as needed for muscle spasms.   MILK THISTLE PO Take 125 mg by mouth at bedtime.   norethindrone-ethinyl estradiol 1-20 MG-MCG tablet Commonly known as: LOESTRIN Take 1 tablet by mouth at bedtime.   oxyCODONE 5 MG immediate release tablet Commonly known as: Oxy IR/ROXICODONE Take 1-2 tablets (5-10 mg total) by mouth every 4 (four) hours as needed for moderate pain (pain score 4-6).   vitamin C 500 MG tablet Commonly known as: ASCORBIC ACID Take 500 mg by mouth at bedtime.   Vitamin D 50 MCG (2000 UT) Caps Take 2,000 Units by mouth daily.   zinc gluconate 50 MG tablet Take 50 mg by mouth at bedtime.               Durable Medical Equipment  (From admission, onward)           Start     Ordered   07/30/20 1355  DME Walker rolling  Once       Question Answer Comment  Walker: With 5 Inch Wheels   Patient needs a walker to treat with the following condition Status post total replacement of right hip      07/30/20 1354   07/30/20 1355  DME 3 n 1  Once        07/30/20 1354            Diagnostic Studies: DG Pelvis Portable  Result Date: 07/30/2020 CLINICAL DATA:  Status post right hip arthroplasty. EXAM: PORTABLE PELVIS 1-2 VIEWS COMPARISON:  None. FINDINGS: The right femoral and acetabular  components appear to be well situated. Expected postoperative changes are noted in the surrounding soft tissues. IMPRESSION: Status post  right total hip arthroplasty. Electronically Signed   By: Lupita Raider M.D.   On: 07/30/2020 13:45   DG C-Arm 1-60 Min-No Report  Result Date: 07/30/2020 Fluoroscopy was utilized by the requesting physician.  No radiographic interpretation.   DG HIP OPERATIVE UNILAT W OR W/O PELVIS RIGHT  Result Date: 07/30/2020 CLINICAL DATA:  Right hip replacement. EXAM: OPERATIVE right HIP (WITH PELVIS IF PERFORMED) 7 VIEWS TECHNIQUE: Fluoroscopic spot image(s) were submitted for interpretation post-operatively. Radiation exposure index: 4.05 mGy. COMPARISON:  February 16, 2020. FINDINGS: Seven intraoperative fluoroscopic images were obtained of the right hip. The right acetabular and femoral components appear to be well situated. IMPRESSION: Fluoroscopic guidance provided during right total hip arthroplasty. Electronically Signed   By: Lupita Raider M.D.   On: 07/30/2020 13:06   DG Knee AP/LAT W/Sunrise Right  Result Date: 07/26/2020 Routine images right knee x3 Follow-up chronic right knee pain history of rheumatoid arthritis Patient's alignment is valgus Mild to moderate lateral joint space narrowing Minimal osteophytes as expected Minimal progression since last x-ray Impression mild to moderate arthritis rheumatoid right knee alignment still valgus not excessive   Disposition: Discharge disposition: 01-Home or Self Care          Follow-up Information     Kathryne Hitch, MD Follow up in 2 week(s).   Specialty: Orthopedic Surgery Contact information: [address] [place] [address] [phone]                  Signed: Kathryne Hitch 07/31/2020, 10:56 AM

## 2020-07-31 NOTE — Progress Notes (Signed)
Patient ID: Jessica Pacheco, female   DOB: Oct 26, 1975, 45 y.o.   MRN: [id number] The patient is awake and alert and comfortable.  Her right hip incision is clean and dry.  Her leg lengths are equal.  Her vital signs are stable and her labs are stable.  She is already have a good report from physical therapy.  She will be able to be discharged to home this afternoon.

## 2020-07-31 NOTE — Evaluation (Signed)
Physical Therapy Evaluation Patient Details Name: Jessica Pacheco MRN: [id number] DOB: 06-22-1975 Today's Date: 07/31/2020   History of Present Illness  Pt s/p R THR and with hx of DDD and RA  Clinical Impression  Pt s/p R THR and presents with decreased R LE strength/ROM and post op pain limiting functional mobility.  Pt should progress to dc home with family assist.    Follow Up Recommendations Home health PT;Follow surgeon's recommendation for DC plan and follow-up therapies    Equipment Recommendations  Rolling walker with 5" wheels;3in1 (PT)    Recommendations for Other Services       Precautions / Restrictions Precautions Precautions: Fall Restrictions Weight Bearing Restrictions: No RLE Weight Bearing: Weight bearing as tolerated      Mobility  Bed Mobility Overal bed mobility: Needs Assistance Bed Mobility: Supine to Sit     Supine to sit: Min guard     General bed mobility comments: cues for sequence and use of L LE to self assist    Transfers Overall transfer level: Needs assistance Equipment used: Rolling walker (2 wheeled) Transfers: Sit to/from Stand Sit to Stand: Min guard         General transfer comment: cues for LE management and use of UEs to self assist  Ambulation/Gait Ambulation/Gait assistance: Min assist;Min guard Gait Distance (Feet): 140 Feet Assistive device: Rolling walker (2 wheeled) Gait Pattern/deviations: Step-to pattern;Step-through pattern;Decreased step length - right;Decreased step length - left;Shuffle;Trunk flexed     General Gait Details: cues for sequence, posture and position from [employer]            Wheelchair Mobility    Modified Rankin (Stroke Patients Only)       Balance Overall balance assessment: Needs assistance Sitting-balance support: No upper extremity supported;Feet supported Sitting balance-Leahy Scale: Good     Standing balance support: Bilateral upper extremity supported Standing  balance-Leahy Scale: Fair                               Pertinent Vitals/Pain Pain Assessment: 0-10 Pain Score: 5  Pain Location: R hip Pain Descriptors / Indicators: Aching;Burning Pain Intervention(s): Limited activity within patient's tolerance;Monitored during session;Premedicated before session;Ice applied    Home Living Family/patient expects to be discharged to:: Private residence Living Arrangements: Spouse/significant other;Children Available Help at Discharge: Family Type of Home: House Home Access: Stairs to enter Entrance Stairs-Rails: None Secretary/administrator of Steps: 2 Home Layout: One level Home Equipment: None      Prior Function Level of Independence: Independent               Hand Dominance        Extremity/Trunk Assessment   Upper Extremity Assessment Upper Extremity Assessment: Overall WFL for tasks assessed    Lower Extremity Assessment Lower Extremity Assessment: RLE deficits/detail RLE Deficits / Details: 2+/5 strength at hip with AAROM at hip to 80 flex and 15 abd    Cervical / Trunk Assessment Cervical / Trunk Assessment: Normal  Communication   Communication: No difficulties  Cognition Arousal/Alertness: Awake/alert Behavior During Therapy: WFL for tasks assessed/performed Overall Cognitive Status: Within Functional Limits for tasks assessed                                        General Comments      Exercises Total Joint  Exercises Ankle Circles/Pumps: AROM;Both;15 reps;Supine Quad Sets: AROM;Both;10 reps;Supine Heel Slides: AAROM;Right;20 reps;Supine Hip ABduction/ADduction: AAROM;Right;15 reps;Supine   Assessment/Plan    PT Assessment Patient needs continued PT services  PT Problem List Decreased strength;Decreased range of motion;Decreased activity tolerance;Decreased balance;Decreased mobility;Decreased knowledge of use of DME;Pain       PT Treatment Interventions DME  instruction;Gait training;Stair training;Functional mobility training;Therapeutic activities;Therapeutic exercise;Patient/family education    PT Goals (Current goals can be found in the Care Plan section)  Acute Rehab PT Goals Patient Stated Goal: Regain IND PT Goal Formulation: With patient Time For Goal Achievement: 08/14/20 Potential to Achieve Goals: Good    Frequency 7X/week   Barriers to discharge        Co-evaluation               AM-PAC PT "6 Clicks" Mobility  Outcome Measure Help needed turning from your back to your side while in a flat bed without using bedrails?: A Little Help needed moving from lying on your back to sitting on the side of a flat bed without using bedrails?: A Little Help needed moving to and from a bed to a chair (including a wheelchair)?: A Little Help needed standing up from a chair using your arms (e.g., wheelchair or bedside chair)?: A Little Help needed to walk in hospital room?: A Little Help needed climbing 3-5 steps with a railing? : A Little 6 Click Score: 18    End of Session Equipment Utilized During Treatment: Gait belt Activity Tolerance: Patient tolerated treatment well Patient left: in chair;with call bell/phone within reach Nurse Communication: Mobility status PT Visit Diagnosis: Difficulty in walking, not elsewhere classified (R26.2)    Time: 4196-2229 PT Time Calculation (min) (ACUTE ONLY): 31 min   Charges:     PT Treatments $Therapeutic Exercise: 8-22 mins        Mauro Kaufmann PT Acute Rehabilitation Services Pager [phone] Office [phone]   Vera Furniss 07/31/2020, 10:25 AM

## 2020-07-31 NOTE — Plan of Care (Signed)
  Problem: Coping: Goal: Level of anxiety will decrease Outcome: Progressing   Problem: Pain Managment: Goal: General experience of comfort will improve Outcome: Progressing   

## 2020-08-02 ENCOUNTER — Encounter (HOSPITAL_COMMUNITY): Payer: Self-pay | Admitting: Orthopaedic Surgery

## 2020-08-12 ENCOUNTER — Ambulatory Visit (INDEPENDENT_AMBULATORY_CARE_PROVIDER_SITE_OTHER): Payer: Medicare Other | Admitting: Orthopaedic Surgery

## 2020-08-12 ENCOUNTER — Encounter: Payer: Self-pay | Admitting: Orthopaedic Surgery

## 2020-08-12 DIAGNOSIS — Z96641 Presence of right artificial hip joint: Secondary | ICD-10-CM

## 2020-08-12 NOTE — Progress Notes (Signed)
The patient is 2 weeks tomorrow status post a right total hip arthroplasty.  She is doing well.  She is only 45 years old.  She is ambulate with a cane.  She is taking occasional oxycodone and more often taking Robaxin.  She knows when she gets low she can always call for these.  She has been compliant with an aspirin twice a day and wearing her compressive hose.  She did not take aspirin before the surgery.  She will take it once a day for a week and then can stop aspirin.  She is he is having no foot and ankle swelling or calf pain she can stop wearing the compressive garments.  Her right operative hip moves smoothly and fluidly.  She looks good overall.  The staples have been removed from her incision and Steri-Strips applied.  She will continue to slowly increase her activities as comfort allows.  I would like to see her back in 4 weeks for repeat exam but no x-rays are needed.  All questions and concerns were answered and addressed.

## 2020-08-19 ENCOUNTER — Other Ambulatory Visit: Payer: Self-pay | Admitting: Orthopaedic Surgery

## 2020-08-19 ENCOUNTER — Other Ambulatory Visit (INDEPENDENT_AMBULATORY_CARE_PROVIDER_SITE_OTHER): Payer: Self-pay | Admitting: Orthopaedic Surgery

## 2020-08-20 ENCOUNTER — Other Ambulatory Visit: Payer: Self-pay | Admitting: Orthopaedic Surgery

## 2020-08-20 MED ORDER — OXYCODONE HCL 5 MG PO TABS: 5.0000 mg | ORAL_TABLET | Freq: Four times a day (QID) | ORAL | 0 refills | Status: DC | PRN

## 2020-08-20 NOTE — Telephone Encounter (Signed)
ok 

## 2020-09-09 ENCOUNTER — Encounter: Payer: Self-pay | Admitting: Orthopaedic Surgery

## 2020-09-09 ENCOUNTER — Other Ambulatory Visit: Payer: Self-pay

## 2020-09-09 ENCOUNTER — Ambulatory Visit (INDEPENDENT_AMBULATORY_CARE_PROVIDER_SITE_OTHER): Payer: Medicare Other | Admitting: Orthopaedic Surgery

## 2020-09-09 VITALS — Ht 66.0 in | Wt 227.0 lb

## 2020-09-09 DIAGNOSIS — Z96641 Presence of right artificial hip joint: Secondary | ICD-10-CM

## 2020-09-09 MED ORDER — OXYCODONE HCL 5 MG PO TABS: 5.0000 mg | ORAL_TABLET | Freq: Four times a day (QID) | ORAL | 0 refills | Status: DC | PRN

## 2020-09-09 NOTE — Progress Notes (Signed)
She is now 6 weeks status post a right total hip arthroplasty.  She is having some numbness and burning and itching around her incision but otherwise is doing well.  She is ambulating with a cane.  On examination her incision looks good with her right hip.  Her leg lengths are equal.  She is having some knee pain and back pain which is appropriate.  Her right operative hip does move smoothly.  At this point I will refill her pain medication still but she is taking this sparingly.  Since she is doing so well we do not really need to see her back for 6 months unless there are issues.  At that visit we will have a standing low AP pelvis and lateral of her right operative hip.

## 2020-11-17 ENCOUNTER — Other Ambulatory Visit: Payer: Self-pay | Admitting: Orthopaedic Surgery

## 2020-11-18 NOTE — Telephone Encounter (Signed)
ok 

## 2020-12-28 DIAGNOSIS — S233XXA Sprain of ligaments of thoracic spine, initial encounter: Secondary | ICD-10-CM | POA: Diagnosis not present

## 2020-12-28 DIAGNOSIS — S338XXA Sprain of other parts of lumbar spine and pelvis, initial encounter: Secondary | ICD-10-CM | POA: Diagnosis not present

## 2020-12-28 DIAGNOSIS — M9903 Segmental and somatic dysfunction of lumbar region: Secondary | ICD-10-CM | POA: Diagnosis not present

## 2020-12-28 DIAGNOSIS — M9902 Segmental and somatic dysfunction of thoracic region: Secondary | ICD-10-CM | POA: Diagnosis not present

## 2021-01-03 DIAGNOSIS — M9903 Segmental and somatic dysfunction of lumbar region: Secondary | ICD-10-CM | POA: Diagnosis not present

## 2021-01-03 DIAGNOSIS — S233XXA Sprain of ligaments of thoracic spine, initial encounter: Secondary | ICD-10-CM | POA: Diagnosis not present

## 2021-01-03 DIAGNOSIS — M9902 Segmental and somatic dysfunction of thoracic region: Secondary | ICD-10-CM | POA: Diagnosis not present

## 2021-01-03 DIAGNOSIS — S338XXA Sprain of other parts of lumbar spine and pelvis, initial encounter: Secondary | ICD-10-CM | POA: Diagnosis not present

## 2021-01-10 DIAGNOSIS — Z79899 Other long term (current) drug therapy: Secondary | ICD-10-CM | POA: Diagnosis not present

## 2021-01-12 DIAGNOSIS — S338XXA Sprain of other parts of lumbar spine and pelvis, initial encounter: Secondary | ICD-10-CM | POA: Diagnosis not present

## 2021-01-12 DIAGNOSIS — S233XXA Sprain of ligaments of thoracic spine, initial encounter: Secondary | ICD-10-CM | POA: Diagnosis not present

## 2021-01-12 DIAGNOSIS — M9903 Segmental and somatic dysfunction of lumbar region: Secondary | ICD-10-CM | POA: Diagnosis not present

## 2021-01-12 DIAGNOSIS — M9902 Segmental and somatic dysfunction of thoracic region: Secondary | ICD-10-CM | POA: Diagnosis not present

## 2021-01-26 DIAGNOSIS — S338XXA Sprain of other parts of lumbar spine and pelvis, initial encounter: Secondary | ICD-10-CM | POA: Diagnosis not present

## 2021-01-26 DIAGNOSIS — M9903 Segmental and somatic dysfunction of lumbar region: Secondary | ICD-10-CM | POA: Diagnosis not present

## 2021-01-26 DIAGNOSIS — M9902 Segmental and somatic dysfunction of thoracic region: Secondary | ICD-10-CM | POA: Diagnosis not present

## 2021-01-26 DIAGNOSIS — S233XXA Sprain of ligaments of thoracic spine, initial encounter: Secondary | ICD-10-CM | POA: Diagnosis not present

## 2021-01-31 DIAGNOSIS — S338XXA Sprain of other parts of lumbar spine and pelvis, initial encounter: Secondary | ICD-10-CM | POA: Diagnosis not present

## 2021-01-31 DIAGNOSIS — S233XXA Sprain of ligaments of thoracic spine, initial encounter: Secondary | ICD-10-CM | POA: Diagnosis not present

## 2021-01-31 DIAGNOSIS — M9902 Segmental and somatic dysfunction of thoracic region: Secondary | ICD-10-CM | POA: Diagnosis not present

## 2021-01-31 DIAGNOSIS — M9903 Segmental and somatic dysfunction of lumbar region: Secondary | ICD-10-CM | POA: Diagnosis not present

## 2021-02-01 DIAGNOSIS — I10 Essential (primary) hypertension: Secondary | ICD-10-CM | POA: Diagnosis not present

## 2021-02-01 DIAGNOSIS — S61411A Laceration without foreign body of right hand, initial encounter: Secondary | ICD-10-CM | POA: Diagnosis not present

## 2021-02-10 DIAGNOSIS — S233XXA Sprain of ligaments of thoracic spine, initial encounter: Secondary | ICD-10-CM | POA: Diagnosis not present

## 2021-02-10 DIAGNOSIS — M9903 Segmental and somatic dysfunction of lumbar region: Secondary | ICD-10-CM | POA: Diagnosis not present

## 2021-02-10 DIAGNOSIS — S338XXA Sprain of other parts of lumbar spine and pelvis, initial encounter: Secondary | ICD-10-CM | POA: Diagnosis not present

## 2021-02-10 DIAGNOSIS — M9902 Segmental and somatic dysfunction of thoracic region: Secondary | ICD-10-CM | POA: Diagnosis not present

## 2021-02-17 DIAGNOSIS — M9902 Segmental and somatic dysfunction of thoracic region: Secondary | ICD-10-CM | POA: Diagnosis not present

## 2021-02-17 DIAGNOSIS — S338XXA Sprain of other parts of lumbar spine and pelvis, initial encounter: Secondary | ICD-10-CM | POA: Diagnosis not present

## 2021-02-17 DIAGNOSIS — M9903 Segmental and somatic dysfunction of lumbar region: Secondary | ICD-10-CM | POA: Diagnosis not present

## 2021-02-17 DIAGNOSIS — S233XXA Sprain of ligaments of thoracic spine, initial encounter: Secondary | ICD-10-CM | POA: Diagnosis not present

## 2021-02-24 DIAGNOSIS — M9902 Segmental and somatic dysfunction of thoracic region: Secondary | ICD-10-CM | POA: Diagnosis not present

## 2021-02-24 DIAGNOSIS — M9903 Segmental and somatic dysfunction of lumbar region: Secondary | ICD-10-CM | POA: Diagnosis not present

## 2021-02-24 DIAGNOSIS — S338XXA Sprain of other parts of lumbar spine and pelvis, initial encounter: Secondary | ICD-10-CM | POA: Diagnosis not present

## 2021-02-24 DIAGNOSIS — S233XXA Sprain of ligaments of thoracic spine, initial encounter: Secondary | ICD-10-CM | POA: Diagnosis not present

## 2021-02-28 ENCOUNTER — Other Ambulatory Visit: Payer: Self-pay

## 2021-02-28 ENCOUNTER — Other Ambulatory Visit (HOSPITAL_COMMUNITY): Payer: Self-pay | Admitting: Obstetrics and Gynecology

## 2021-02-28 ENCOUNTER — Ambulatory Visit (HOSPITAL_COMMUNITY)
Admission: RE | Admit: 2021-02-28 | Discharge: 2021-02-28 | Disposition: A | Discharge status: Home / Self Care | Payer: Medicare HMO | Source: Ambulatory Visit | Attending: Obstetrics and Gynecology | Admitting: Obstetrics and Gynecology

## 2021-02-28 DIAGNOSIS — Z1231 Encounter for screening mammogram for malignant neoplasm of breast: Secondary | ICD-10-CM | POA: Diagnosis not present

## 2021-03-03 DIAGNOSIS — S338XXA Sprain of other parts of lumbar spine and pelvis, initial encounter: Secondary | ICD-10-CM | POA: Diagnosis not present

## 2021-03-03 DIAGNOSIS — M9902 Segmental and somatic dysfunction of thoracic region: Secondary | ICD-10-CM | POA: Diagnosis not present

## 2021-03-03 DIAGNOSIS — M9903 Segmental and somatic dysfunction of lumbar region: Secondary | ICD-10-CM | POA: Diagnosis not present

## 2021-03-03 DIAGNOSIS — S233XXA Sprain of ligaments of thoracic spine, initial encounter: Secondary | ICD-10-CM | POA: Diagnosis not present

## 2021-03-14 ENCOUNTER — Other Ambulatory Visit: Payer: Self-pay

## 2021-03-14 ENCOUNTER — Encounter: Payer: Self-pay | Admitting: Orthopaedic Surgery

## 2021-03-14 ENCOUNTER — Ambulatory Visit (INDEPENDENT_AMBULATORY_CARE_PROVIDER_SITE_OTHER): Payer: Medicare HMO

## 2021-03-14 ENCOUNTER — Ambulatory Visit (INDEPENDENT_AMBULATORY_CARE_PROVIDER_SITE_OTHER): Payer: Medicare HMO | Admitting: Orthopaedic Surgery

## 2021-03-14 DIAGNOSIS — Z96641 Presence of right artificial hip joint: Secondary | ICD-10-CM | POA: Diagnosis not present

## 2021-03-14 NOTE — Progress Notes (Signed)
The patient is a very active 46 year old female who is 7 months out from a right total hip arthroplasty to treat severe end-stage arthritis of her right hip.  She is doing well overall.  She has some pain over the trochanteric area and occasional muscle spasms but she feels like she is much improved.  She is already ridden a horse about 3 times.  She is walking without assistive device and denies any groin pain.  On exam her leg lengths are equal.  She tolerates me easily putting her right hip through internal and external rotation and there is no instability on my exam.  An AP pelvis and lateral right hip shows a well-seated total hip arthroplasty on the right side.  Her left hip has a normal joint space.  At this point follow-up can be as needed for the right hip.  I did talk to her about the things that we need to bring her back for his right hip and she agrees as well.  All question concerns were answered addressed.

## 2021-03-21 DIAGNOSIS — M9903 Segmental and somatic dysfunction of lumbar region: Secondary | ICD-10-CM | POA: Diagnosis not present

## 2021-03-21 DIAGNOSIS — S338XXA Sprain of other parts of lumbar spine and pelvis, initial encounter: Secondary | ICD-10-CM | POA: Diagnosis not present

## 2021-03-21 DIAGNOSIS — M9902 Segmental and somatic dysfunction of thoracic region: Secondary | ICD-10-CM | POA: Diagnosis not present

## 2021-03-21 DIAGNOSIS — S233XXA Sprain of ligaments of thoracic spine, initial encounter: Secondary | ICD-10-CM | POA: Diagnosis not present

## 2021-03-23 DIAGNOSIS — M0609 Rheumatoid arthritis without rheumatoid factor, multiple sites: Secondary | ICD-10-CM | POA: Diagnosis not present

## 2021-03-23 DIAGNOSIS — Z79899 Other long term (current) drug therapy: Secondary | ICD-10-CM | POA: Diagnosis not present

## 2021-03-24 DIAGNOSIS — Z79899 Other long term (current) drug therapy: Secondary | ICD-10-CM | POA: Diagnosis not present

## 2021-03-24 DIAGNOSIS — H524 Presbyopia: Secondary | ICD-10-CM | POA: Diagnosis not present

## 2021-04-14 DIAGNOSIS — S338XXA Sprain of other parts of lumbar spine and pelvis, initial encounter: Secondary | ICD-10-CM | POA: Diagnosis not present

## 2021-04-14 DIAGNOSIS — M9903 Segmental and somatic dysfunction of lumbar region: Secondary | ICD-10-CM | POA: Diagnosis not present

## 2021-04-14 DIAGNOSIS — S233XXA Sprain of ligaments of thoracic spine, initial encounter: Secondary | ICD-10-CM | POA: Diagnosis not present

## 2021-04-14 DIAGNOSIS — M9902 Segmental and somatic dysfunction of thoracic region: Secondary | ICD-10-CM | POA: Diagnosis not present

## 2021-04-21 DIAGNOSIS — S233XXA Sprain of ligaments of thoracic spine, initial encounter: Secondary | ICD-10-CM | POA: Diagnosis not present

## 2021-04-21 DIAGNOSIS — S338XXA Sprain of other parts of lumbar spine and pelvis, initial encounter: Secondary | ICD-10-CM | POA: Diagnosis not present

## 2021-04-21 DIAGNOSIS — M9903 Segmental and somatic dysfunction of lumbar region: Secondary | ICD-10-CM | POA: Diagnosis not present

## 2021-04-21 DIAGNOSIS — M9902 Segmental and somatic dysfunction of thoracic region: Secondary | ICD-10-CM | POA: Diagnosis not present

## 2021-04-25 DIAGNOSIS — Z124 Encounter for screening for malignant neoplasm of cervix: Secondary | ICD-10-CM | POA: Diagnosis not present

## 2021-04-25 DIAGNOSIS — Z6836 Body mass index (BMI) 36.0-36.9, adult: Secondary | ICD-10-CM | POA: Diagnosis not present

## 2021-04-25 DIAGNOSIS — Z309 Encounter for contraceptive management, unspecified: Secondary | ICD-10-CM | POA: Diagnosis not present

## 2021-04-25 DIAGNOSIS — R69 Illness, unspecified: Secondary | ICD-10-CM | POA: Diagnosis not present

## 2021-04-25 DIAGNOSIS — Z01419 Encounter for gynecological examination (general) (routine) without abnormal findings: Secondary | ICD-10-CM | POA: Diagnosis not present

## 2021-05-03 DIAGNOSIS — I10 Essential (primary) hypertension: Secondary | ICD-10-CM | POA: Diagnosis not present

## 2021-05-03 DIAGNOSIS — M0579 Rheumatoid arthritis with rheumatoid factor of multiple sites without organ or systems involvement: Secondary | ICD-10-CM | POA: Diagnosis not present

## 2021-05-05 DIAGNOSIS — M9902 Segmental and somatic dysfunction of thoracic region: Secondary | ICD-10-CM | POA: Diagnosis not present

## 2021-05-05 DIAGNOSIS — S338XXA Sprain of other parts of lumbar spine and pelvis, initial encounter: Secondary | ICD-10-CM | POA: Diagnosis not present

## 2021-05-05 DIAGNOSIS — S233XXA Sprain of ligaments of thoracic spine, initial encounter: Secondary | ICD-10-CM | POA: Diagnosis not present

## 2021-05-05 DIAGNOSIS — M9903 Segmental and somatic dysfunction of lumbar region: Secondary | ICD-10-CM | POA: Diagnosis not present

## 2021-05-16 DIAGNOSIS — M9902 Segmental and somatic dysfunction of thoracic region: Secondary | ICD-10-CM | POA: Diagnosis not present

## 2021-05-16 DIAGNOSIS — S233XXA Sprain of ligaments of thoracic spine, initial encounter: Secondary | ICD-10-CM | POA: Diagnosis not present

## 2021-05-16 DIAGNOSIS — S338XXA Sprain of other parts of lumbar spine and pelvis, initial encounter: Secondary | ICD-10-CM | POA: Diagnosis not present

## 2021-05-16 DIAGNOSIS — M9903 Segmental and somatic dysfunction of lumbar region: Secondary | ICD-10-CM | POA: Diagnosis not present

## 2021-05-17 DIAGNOSIS — J069 Acute upper respiratory infection, unspecified: Secondary | ICD-10-CM | POA: Diagnosis not present

## 2021-06-02 DIAGNOSIS — S338XXA Sprain of other parts of lumbar spine and pelvis, initial encounter: Secondary | ICD-10-CM | POA: Diagnosis not present

## 2021-06-02 DIAGNOSIS — S233XXA Sprain of ligaments of thoracic spine, initial encounter: Secondary | ICD-10-CM | POA: Diagnosis not present

## 2021-06-02 DIAGNOSIS — M9903 Segmental and somatic dysfunction of lumbar region: Secondary | ICD-10-CM | POA: Diagnosis not present

## 2021-06-02 DIAGNOSIS — M9902 Segmental and somatic dysfunction of thoracic region: Secondary | ICD-10-CM | POA: Diagnosis not present

## 2021-06-16 DIAGNOSIS — S338XXA Sprain of other parts of lumbar spine and pelvis, initial encounter: Secondary | ICD-10-CM | POA: Diagnosis not present

## 2021-06-16 DIAGNOSIS — S233XXA Sprain of ligaments of thoracic spine, initial encounter: Secondary | ICD-10-CM | POA: Diagnosis not present

## 2021-06-16 DIAGNOSIS — M9902 Segmental and somatic dysfunction of thoracic region: Secondary | ICD-10-CM | POA: Diagnosis not present

## 2021-06-16 DIAGNOSIS — M9903 Segmental and somatic dysfunction of lumbar region: Secondary | ICD-10-CM | POA: Diagnosis not present

## 2021-06-27 DIAGNOSIS — S338XXA Sprain of other parts of lumbar spine and pelvis, initial encounter: Secondary | ICD-10-CM | POA: Diagnosis not present

## 2021-06-27 DIAGNOSIS — M9902 Segmental and somatic dysfunction of thoracic region: Secondary | ICD-10-CM | POA: Diagnosis not present

## 2021-06-27 DIAGNOSIS — S233XXA Sprain of ligaments of thoracic spine, initial encounter: Secondary | ICD-10-CM | POA: Diagnosis not present

## 2021-06-27 DIAGNOSIS — M9903 Segmental and somatic dysfunction of lumbar region: Secondary | ICD-10-CM | POA: Diagnosis not present

## 2021-07-07 DIAGNOSIS — S233XXA Sprain of ligaments of thoracic spine, initial encounter: Secondary | ICD-10-CM | POA: Diagnosis not present

## 2021-07-07 DIAGNOSIS — S338XXA Sprain of other parts of lumbar spine and pelvis, initial encounter: Secondary | ICD-10-CM | POA: Diagnosis not present

## 2021-07-07 DIAGNOSIS — M9902 Segmental and somatic dysfunction of thoracic region: Secondary | ICD-10-CM | POA: Diagnosis not present

## 2021-07-07 DIAGNOSIS — M9903 Segmental and somatic dysfunction of lumbar region: Secondary | ICD-10-CM | POA: Diagnosis not present

## 2021-07-21 DIAGNOSIS — S233XXA Sprain of ligaments of thoracic spine, initial encounter: Secondary | ICD-10-CM | POA: Diagnosis not present

## 2021-07-21 DIAGNOSIS — S338XXA Sprain of other parts of lumbar spine and pelvis, initial encounter: Secondary | ICD-10-CM | POA: Diagnosis not present

## 2021-07-21 DIAGNOSIS — M9902 Segmental and somatic dysfunction of thoracic region: Secondary | ICD-10-CM | POA: Diagnosis not present

## 2021-07-21 DIAGNOSIS — M9903 Segmental and somatic dysfunction of lumbar region: Secondary | ICD-10-CM | POA: Diagnosis not present

## 2021-07-25 ENCOUNTER — Ambulatory Visit (INDEPENDENT_AMBULATORY_CARE_PROVIDER_SITE_OTHER): Payer: Medicare HMO

## 2021-07-25 ENCOUNTER — Ambulatory Visit: Payer: Medicare HMO | Admitting: Orthopedic Surgery

## 2021-07-25 DIAGNOSIS — M1711 Unilateral primary osteoarthritis, right knee: Secondary | ICD-10-CM | POA: Diagnosis not present

## 2021-07-25 DIAGNOSIS — M0609 Rheumatoid arthritis without rheumatoid factor, multiple sites: Secondary | ICD-10-CM | POA: Diagnosis not present

## 2021-07-25 DIAGNOSIS — M25561 Pain in right knee: Secondary | ICD-10-CM | POA: Diagnosis not present

## 2021-07-25 DIAGNOSIS — G8929 Other chronic pain: Secondary | ICD-10-CM | POA: Diagnosis not present

## 2021-07-25 NOTE — Progress Notes (Signed)
FOLLOW UP   Encounter Diagnoses  Name Primary?   Chronic pain of right knee Yes   Rheumatoid arthritis of multiple sites with negative rheumatoid factor (HCC)    Primary osteoarthritis of right knee      Chief Complaint  Patient presents with   Follow-up    Recheck on right knee     45 YO FEMALE WITH RF NEG RA, S/P RT THA PRESENTS FOR ANNUAL FOLLOW UP XRAYS RIGHT KNEE   RT KNEE SCOPE IN 2019 : LAT MENISECTOMY AND SYNOVECTOMY   FINDINGS:  Medial compartment small 5 x 3 superficial cartilage lesion previously noted resection intact remaining cartilaginous surfaces otherwise normal   Lateral compartment intact popliteus tendon, chondromalacia of the lateral femoral condyle and plateau mild with posterior horn body and anterior horn tear lateral meniscus   ACL intact PCL intact   Patellofemoral joint mild chondromalacia   Mild to moderate synovitis were seen in various portions of the joint specifically around the ACL popliteus tendon and some in the suprapatellar pouch.  This contributed to appearance of ACL abnormality on MRI and popliteus tendon abnormality on MRI  X-RAYS LOOK SIMILAR MINIMAL CHANGES FROM LAST YR   SHE STILL HAS 0-125 ROM   REC 1 YR XRAYS

## 2021-08-04 DIAGNOSIS — S338XXA Sprain of other parts of lumbar spine and pelvis, initial encounter: Secondary | ICD-10-CM | POA: Diagnosis not present

## 2021-08-04 DIAGNOSIS — M9902 Segmental and somatic dysfunction of thoracic region: Secondary | ICD-10-CM | POA: Diagnosis not present

## 2021-08-04 DIAGNOSIS — S233XXA Sprain of ligaments of thoracic spine, initial encounter: Secondary | ICD-10-CM | POA: Diagnosis not present

## 2021-08-04 DIAGNOSIS — M9903 Segmental and somatic dysfunction of lumbar region: Secondary | ICD-10-CM | POA: Diagnosis not present

## 2021-08-08 DIAGNOSIS — Z79899 Other long term (current) drug therapy: Secondary | ICD-10-CM | POA: Diagnosis not present

## 2021-08-08 DIAGNOSIS — M0609 Rheumatoid arthritis without rheumatoid factor, multiple sites: Secondary | ICD-10-CM | POA: Diagnosis not present

## 2021-08-15 DIAGNOSIS — R69 Illness, unspecified: Secondary | ICD-10-CM | POA: Diagnosis not present

## 2021-08-15 DIAGNOSIS — I10 Essential (primary) hypertension: Secondary | ICD-10-CM | POA: Diagnosis not present

## 2021-08-15 DIAGNOSIS — E785 Hyperlipidemia, unspecified: Secondary | ICD-10-CM | POA: Diagnosis not present

## 2021-08-16 ENCOUNTER — Encounter (INDEPENDENT_AMBULATORY_CARE_PROVIDER_SITE_OTHER): Payer: Self-pay | Admitting: *Deleted

## 2021-08-16 DIAGNOSIS — M792 Neuralgia and neuritis, unspecified: Secondary | ICD-10-CM | POA: Diagnosis not present

## 2021-08-16 DIAGNOSIS — S338XXA Sprain of other parts of lumbar spine and pelvis, initial encounter: Secondary | ICD-10-CM | POA: Diagnosis not present

## 2021-08-16 DIAGNOSIS — S233XXA Sprain of ligaments of thoracic spine, initial encounter: Secondary | ICD-10-CM | POA: Diagnosis not present

## 2021-08-16 DIAGNOSIS — M9903 Segmental and somatic dysfunction of lumbar region: Secondary | ICD-10-CM | POA: Diagnosis not present

## 2021-08-16 DIAGNOSIS — M79671 Pain in right foot: Secondary | ICD-10-CM | POA: Diagnosis not present

## 2021-08-16 DIAGNOSIS — M9902 Segmental and somatic dysfunction of thoracic region: Secondary | ICD-10-CM | POA: Diagnosis not present

## 2021-08-30 DIAGNOSIS — S233XXA Sprain of ligaments of thoracic spine, initial encounter: Secondary | ICD-10-CM | POA: Diagnosis not present

## 2021-08-30 DIAGNOSIS — S338XXA Sprain of other parts of lumbar spine and pelvis, initial encounter: Secondary | ICD-10-CM | POA: Diagnosis not present

## 2021-08-30 DIAGNOSIS — M9902 Segmental and somatic dysfunction of thoracic region: Secondary | ICD-10-CM | POA: Diagnosis not present

## 2021-08-30 DIAGNOSIS — M9903 Segmental and somatic dysfunction of lumbar region: Secondary | ICD-10-CM | POA: Diagnosis not present

## 2021-09-13 DIAGNOSIS — M19072 Primary osteoarthritis, left ankle and foot: Secondary | ICD-10-CM | POA: Diagnosis not present

## 2021-09-13 DIAGNOSIS — M792 Neuralgia and neuritis, unspecified: Secondary | ICD-10-CM | POA: Diagnosis not present

## 2021-09-13 DIAGNOSIS — M79671 Pain in right foot: Secondary | ICD-10-CM | POA: Diagnosis not present

## 2021-09-14 DIAGNOSIS — M9902 Segmental and somatic dysfunction of thoracic region: Secondary | ICD-10-CM | POA: Diagnosis not present

## 2021-09-14 DIAGNOSIS — S233XXA Sprain of ligaments of thoracic spine, initial encounter: Secondary | ICD-10-CM | POA: Diagnosis not present

## 2021-09-14 DIAGNOSIS — M9903 Segmental and somatic dysfunction of lumbar region: Secondary | ICD-10-CM | POA: Diagnosis not present

## 2021-09-14 DIAGNOSIS — S338XXA Sprain of other parts of lumbar spine and pelvis, initial encounter: Secondary | ICD-10-CM | POA: Diagnosis not present

## 2021-09-17 ENCOUNTER — Encounter: Payer: Self-pay | Admitting: Orthopaedic Surgery

## 2021-09-20 DIAGNOSIS — Z79899 Other long term (current) drug therapy: Secondary | ICD-10-CM | POA: Diagnosis not present

## 2021-09-26 ENCOUNTER — Telehealth (INDEPENDENT_AMBULATORY_CARE_PROVIDER_SITE_OTHER): Payer: Self-pay

## 2021-09-26 ENCOUNTER — Encounter (INDEPENDENT_AMBULATORY_CARE_PROVIDER_SITE_OTHER): Payer: Self-pay

## 2021-09-26 ENCOUNTER — Other Ambulatory Visit (INDEPENDENT_AMBULATORY_CARE_PROVIDER_SITE_OTHER): Payer: Self-pay

## 2021-09-26 ENCOUNTER — Encounter (INDEPENDENT_AMBULATORY_CARE_PROVIDER_SITE_OTHER): Payer: Self-pay | Admitting: *Deleted

## 2021-09-26 DIAGNOSIS — Z1211 Encounter for screening for malignant neoplasm of colon: Secondary | ICD-10-CM

## 2021-09-26 DIAGNOSIS — I10 Essential (primary) hypertension: Secondary | ICD-10-CM

## 2021-09-26 DIAGNOSIS — Z01812 Encounter for preprocedural laboratory examination: Secondary | ICD-10-CM

## 2021-09-26 MED ORDER — PEG 3350-KCL-NA BICARB-NACL 420 G PO SOLR: 4000.0000 mL | ORAL | 0 refills | Status: DC

## 2021-09-26 NOTE — Telephone Encounter (Signed)
Referring MD/PCP: Dr Carylon Perches   Procedure: Tcs  Reason/Indication:  Screening , fam hx of colon ca  Has patient had this procedure before?  No   If so, when, by whom and where?    Is there a family history of colon cancer?  Yes   Who?  What age when diagnosed? Father, & Paternal Aunt   Is patient diabetic? If yes, Type 1 or Type 2   No       Does patient have prosthetic heart valve or mechanical valve?  No   Do you have a pacemaker/defibrillator?  No   Has patient ever had endocarditis/atrial fibrillation? No   Does patient use oxygen? no  Has patient had joint replacement within last 12 months?   No   Is patient constipated or do they take laxatives? No   Does patient have a history of alcohol/drug use?  No   Have you had a stroke/heart attack last 6 mths? no  Do you take medicine for weight loss?   no  For female patients,: have you had a hysterectomy  NO                       are you post menopausal NO                       do you still have your menstrual cycle NO   Is patient on blood thinner such as Coumadin, Plavix and/or Aspirin? NO   Medications: Amlodipine 10 mg daily, Hydroxychloroquine 200 mg bid, celecoxib 100 mg bid, escitalopram 20 mg daily, HCTZ 25 mg daily, Lorazepam 0.5 mg prn, Rinvoq 15 mg daily, Levothyroxine 100 mg daily, Junel Fe- once daily  Allergies: NKDA  Medication Adjustment per Dr Levon Hedger NONE   Procedure date & time: 10/26/21 at 12:15 pm

## 2021-09-26 NOTE — Telephone Encounter (Signed)
Jessica Pacheco Ann Hailynn Slovacek, CMA  ?

## 2021-09-29 DIAGNOSIS — S338XXA Sprain of other parts of lumbar spine and pelvis, initial encounter: Secondary | ICD-10-CM | POA: Diagnosis not present

## 2021-09-29 DIAGNOSIS — M9902 Segmental and somatic dysfunction of thoracic region: Secondary | ICD-10-CM | POA: Diagnosis not present

## 2021-09-29 DIAGNOSIS — S233XXA Sprain of ligaments of thoracic spine, initial encounter: Secondary | ICD-10-CM | POA: Diagnosis not present

## 2021-09-29 DIAGNOSIS — M9903 Segmental and somatic dysfunction of lumbar region: Secondary | ICD-10-CM | POA: Diagnosis not present

## 2021-10-12 ENCOUNTER — Ambulatory Visit: Payer: Medicare HMO | Admitting: Orthopaedic Surgery

## 2021-10-12 ENCOUNTER — Ambulatory Visit (INDEPENDENT_AMBULATORY_CARE_PROVIDER_SITE_OTHER): Payer: Medicare HMO

## 2021-10-12 DIAGNOSIS — M25551 Pain in right hip: Secondary | ICD-10-CM

## 2021-10-12 DIAGNOSIS — Z96641 Presence of right artificial hip joint: Secondary | ICD-10-CM

## 2021-10-12 DIAGNOSIS — M5441 Lumbago with sciatica, right side: Secondary | ICD-10-CM

## 2021-10-12 MED ORDER — PREDNISONE 50 MG PO TABS: ORAL_TABLET | ORAL | 0 refills | Status: DC

## 2021-10-12 MED ORDER — TIZANIDINE HCL 4 MG PO TABS: 4.0000 mg | ORAL_TABLET | Freq: Three times a day (TID) | ORAL | 1 refills | Status: DC | PRN

## 2021-10-12 NOTE — Progress Notes (Signed)
The patient is well-known to [place].  She is 14 months out from a right total hip arthroplasty.  Although she is only 46 years old she had severe end-stage arthritis of the right hip.  She has done well until recently.  A few months ago she was swimming and has developed some low back pain with pain in her sciatic region on that right side.  But she is also has some groin pain on the right side.  We performed a anterior hip arthroplasty.  She is walking without a limp or assistive device but has been tight feeling for her especially when swimming.  On examination lying supine her leg lengths are equal.  She does have pain with hip flexion in the groin.  There is no pain with rotation of the hip.  A lot of her pain seems to be the low back to the right side as well as into the sciatic region in the ischium on the right.  She does have a positive straight leg raise on the right side as well but no weakness in her legs and distally her motor and sensory exam is normal.  An AP pelvis and lateral of the right hip shows a well-seated total hip arthroplasty with no complicating features.  There is no evidence of loosening.  I would like to try 5 days of prednisone.  She is on Celebrex and she will continue this.  I would also like to try muscle relaxant and send her to outpatient physical therapy with any modalities that can help decrease her right-sided low back pain and sciatica.  She may certainly have some tendinitis of the hip flexor tendons based on exam this is something that we can address at some point if needed with an ultrasound-guided injection along the iliopsoas tendon.  We will set up outpatient physical therapy for her and I did send in some medications.  See her back in 4 weeks to see how she is doing overall.  If she is still having side symptoms I would like an AP and lateral of the lumbar spine.

## 2021-10-13 ENCOUNTER — Other Ambulatory Visit: Payer: Self-pay

## 2021-10-13 DIAGNOSIS — Z96641 Presence of right artificial hip joint: Secondary | ICD-10-CM

## 2021-10-13 DIAGNOSIS — M5441 Lumbago with sciatica, right side: Secondary | ICD-10-CM

## 2021-10-17 ENCOUNTER — Encounter: Payer: Self-pay | Admitting: Orthopaedic Surgery

## 2021-10-21 ENCOUNTER — Other Ambulatory Visit (HOSPITAL_COMMUNITY)
Admission: RE | Admit: 2021-10-21 | Discharge: 2021-10-21 | Disposition: A | Discharge status: Home / Self Care | Payer: Medicare HMO | Source: Ambulatory Visit | Attending: Gastroenterology | Admitting: Gastroenterology

## 2021-10-21 ENCOUNTER — Other Ambulatory Visit (INDEPENDENT_AMBULATORY_CARE_PROVIDER_SITE_OTHER): Payer: Self-pay | Admitting: Gastroenterology

## 2021-10-21 DIAGNOSIS — Z01812 Encounter for preprocedural laboratory examination: Secondary | ICD-10-CM

## 2021-10-21 DIAGNOSIS — I10 Essential (primary) hypertension: Secondary | ICD-10-CM

## 2021-10-21 LAB — BASIC METABOLIC PANEL
Anion gap: 13 (ref 5–15)
BUN: 13 mg/dL (ref 6–20)
CO2: 23 mmol/L (ref 22–32)
Calcium: 8.9 mg/dL (ref 8.9–10.3)
Chloride: 98 mmol/L (ref 98–111)
Creatinine, Ser: 1.3 mg/dL — ABNORMAL HIGH (ref 0.44–1.00)
GFR, Estimated: 52 mL/min — ABNORMAL LOW (ref >60)
Glucose, Bld: 183 mg/dL — ABNORMAL HIGH (ref 70–99)
Potassium: 2.8 mmol/L — ABNORMAL LOW (ref 3.5–5.1)
Sodium: 134 mmol/L — ABNORMAL LOW (ref 135–145)

## 2021-10-21 LAB — PREGNANCY, URINE: Preg Test, Ur: NEGATIVE

## 2021-10-21 MED ORDER — POTASSIUM CHLORIDE CRYS ER 20 MEQ PO TBCR: 40.0000 meq | EXTENDED_RELEASE_TABLET | Freq: Every day | ORAL | 0 refills | Status: DC

## 2021-10-22 ENCOUNTER — Ambulatory Visit
Admission: EM | Admit: 2021-10-22 | Discharge: 2021-10-22 | Disposition: A | Discharge status: Home / Self Care | Payer: Medicare HMO | Attending: Nurse Practitioner | Admitting: Nurse Practitioner

## 2021-10-22 DIAGNOSIS — R21 Rash and other nonspecific skin eruption: Secondary | ICD-10-CM | POA: Diagnosis not present

## 2021-10-22 DIAGNOSIS — J329 Chronic sinusitis, unspecified: Secondary | ICD-10-CM | POA: Diagnosis not present

## 2021-10-22 DIAGNOSIS — B9789 Other viral agents as the cause of diseases classified elsewhere: Secondary | ICD-10-CM

## 2021-10-22 MED ORDER — TRIAMCINOLONE ACETONIDE 0.025 % EX CREA: 1.0000 | TOPICAL_CREAM | Freq: Two times a day (BID) | CUTANEOUS | 0 refills | Status: DC

## 2021-10-22 MED ORDER — CETIRIZINE-PSEUDOEPHEDRINE ER 5-120 MG PO TB12: 1.0000 | ORAL_TABLET | Freq: Every day | ORAL | 0 refills | Status: DC

## 2021-10-22 NOTE — ED Provider Notes (Signed)
[facility]    CSN: [place]:[id number] Arrival date & time: 10/22/21  1009      History   Chief Complaint Chief Complaint  Patient presents with   Facial Pain   Rash    HPI Jessica FLATEN is a 46 y.o. female.   The history is provided by the patient.   Patient presents for complaints of right ear pain that is been present for the past 3 to 4 days.  Since that time, she has also developed left ear pain that started today.  Patient also reports a new onset of a rash on the right side of her face.  Patient states that she has pressure in the right side of her face and feels like her teeth hurt.  She denies fever, chills, ear drainage, nasal congestion, cough, runny nose, or GI symptoms.  Patient states that she wanted to be evaluated because she is having a colonoscopy on 10/26/2021.    Past Medical History:  Diagnosis Date   Anxiety    Chronic right lower quadrant pain    DDD (degenerative disc disease), cervical    c6-7   Depression    Dyspnea    On exertion at times   GERD (gastroesophageal reflux disease)    WATCHES DIET   History of hypoglycemia    History of kidney stones    passed spontaneously   Hypertension    Hypothyroidism    OA (osteoarthritis)    knees , hips, feet   PONV (postoperative nausea and vomiting)    Rheumatoid arthritis(714.0) dx  2001  multiple sites   rheumotologist-  dr Herbie [place] gay w/ [facility]--- currently treated w/ oral arava and iv actemra   SUI (stress urinary incontinence), female     Patient Active Problem List   Diagnosis Date Noted   Status post total replacement of right hip 07/30/2020   Hypertensive disorder 05/14/2019   Hypothyroidism 05/14/2019   Migraine 05/14/2019   Ovarian endometriosis 05/14/2019   S/P right knee arthroscopy 02/07/18 02/07/2018   Meniscus, lateral, derangement, right    Synovitis of right knee    Arthritis of right knee    Osteoarthritis, multiple sites 01/14/2018   Rheumatoid arthritis of multiple sites  with negative rheumatoid factor ([place]) 05/08/2016   Primary osteoarthritis of both knees 08/30/2015   Primary osteoarthritis of right hip 08/30/2015   Primary osteoarthritis of left foot 06/30/2014   Change in stool 07/27/2011   Contraception 01/02/2011   Rheumatoid arthritis(714.0) 02/21/1999    Past Surgical History:  Procedure Laterality Date   DILATION AND CURETTAGE OF UTERUS     KNEE ARTHROSCOPY Right 1999   KNEE ARTHROSCOPY WITH MEDIAL MENISECTOMY Right 02/07/2018   Procedure: KNEE ARTHROSCOPY WITH PARTIAL LATERAL MENISECTOMY;  Surgeon: Carole Civil, MD;  Location: [facility];  Service: Orthopedics;  Laterality: Right;   LAPAROSCOPY N/A 02/16/2017   Procedure: LAPAROSCOPY DIAGNOSTIC;  Surgeon: Molli Posey, MD;  Location: [facility];  Service: Gynecology;  Laterality: N/A;   TOTAL HIP ARTHROPLASTY Right 07/30/2020   Procedure: RIGHT TOTAL HIP ARTHROPLASTY ANTERIOR APPROACH;  Surgeon: Mcarthur Rossetti, MD;  Location: [facility];  Service: Orthopedics;  Laterality: Right;   WISDOM TOOTH EXTRACTION      OB History   No obstetric history on file.      Home Medications    Prior to Admission medications   Medication Sig Start Date End Date Taking? Authorizing Provider  cetirizine-pseudoephedrine (ZYRTEC-D) 5-120 MG tablet Take 1 tablet by mouth  daily. 10/22/21  Yes Madalyn Legner-Warren, Alda Lea, NP  cetirizine-pseudoephedrine (ZYRTEC-D) 5-120 MG tablet Take 1 tablet by mouth daily. 10/22/21  Yes Vicci Reder-Warren, Alda Lea, NP  triamcinolone (KENALOG) 0.025 % cream Apply 1 Application topically 2 (two) times daily. 10/22/21  Yes Armon Orvis-Warren, Alda Lea, NP  amLODipine (NORVASC) 10 MG tablet Take 10 mg by mouth at bedtime. 01/07/18   [provider]  Ascorbic Acid (VITAMIN C PO) Take 1 tablet by mouth at bedtime.    [provider]  celecoxib (CELEBREX) 100 MG capsule Take 100 mg by mouth 2 (two) times daily. 01/01/18   [provider]   escitalopram (LEXAPRO) 20 MG tablet Take 20 mg by mouth in the morning. 09/17/21   [provider]  hydrochlorothiazide (HYDRODIURIL) 25 MG tablet Take 25 mg by mouth daily after breakfast. 04/09/18   [provider]  hydroxychloroquine (PLAQUENIL) 200 MG tablet Take 200 mg by mouth 2 (two) times daily. 02/11/20   [provider]  ibuprofen (ADVIL) 200 MG tablet Take 800 mg by mouth every 8 (eight) hours as needed (pain.).    [provider]  JUNEL FE 1/20 1-20 MG-MCG tablet Take 1 tablet by mouth every evening. Continuous (only takes active pills) 09/17/21   [provider]  levothyroxine (SYNTHROID) 100 MCG tablet Take 100 mcg by mouth daily before breakfast. 06/24/19   [provider]  loratadine (CLARITIN) 10 MG tablet Take 10 mg by mouth daily as needed for allergies.    [provider]  LORazepam (ATIVAN) 0.5 MG tablet Take 0.5 mg by mouth daily as needed for anxiety. 10/15/21   [provider]  melatonin 3 MG TABS tablet Take 3 mg by mouth at bedtime as needed (sleep).    [provider]  methocarbamol (ROBAXIN) 500 MG tablet TAKE 1 TABLET(500 MG) BY MOUTH EVERY 6 HOURS AS NEEDED FOR MUSCLE SPASMS 11/18/20   Mcarthur Rossetti, MD  oxyCODONE (OXY IR/ROXICODONE) 5 MG immediate release tablet Take 1 tablet (5 mg total) by mouth every 6 (six) hours as needed for moderate pain (pain score 4-6). 09/09/20   Mcarthur Rossetti, MD  polyethylene glycol-electrolytes (TRILYTE) 420 g solution Take 4,000 mLs by mouth as directed. 09/26/21   Harvel Quale, MD  potassium chloride SA (KLOR-CON M) 20 MEQ tablet Take 2 tablets (40 mEq total) by mouth daily for 4 days. 10/21/21 10/25/21  Harvel Quale, MD  predniSONE (DELTASONE) 50 MG tablet Take one tablet daily for 5 days. Patient not taking: Reported on 10/20/2021 10/12/21   Mcarthur Rossetti, MD  tiZANidine (ZANAFLEX) 4 MG tablet Take 1 tablet (4 mg  total) by mouth every 8 (eight) hours as needed for muscle spasms. Patient taking differently: Take 4 mg by mouth at bedtime as needed for muscle spasms. 10/12/21   Mcarthur Rossetti, MD  Upadacitinib ER (RINVOQ) 15 MG TB24 Take 15 mg by mouth in the morning. 08/31/21   [provider]    Family History History reviewed. No pertinent family history.  Social History Social History   Tobacco Use   Smoking status: Never   Smokeless tobacco: Never  Vaping Use   Vaping Use: Never used  Substance Use Topics   Alcohol use: No   Drug use: No     Allergies   Patient has no known allergies.   Review of Systems Review of Systems Per HPI  Physical Exam Triage Vital Signs ED Triage Vitals [10/22/21 1018]  Enc Vitals Group  BP 123/84     Pulse Rate (!) 102     Resp 18     Temp 98.3 F (36.8 C)     Temp Source Oral     SpO2 97 %     Weight      Height      Head Circumference      Peak Flow      Pain Score      Pain Loc      Pain Edu?      Excl. in GC?    No data found.  Updated Vital Signs BP 123/84 (BP Location: Right Arm)   Pulse (!) 102   Temp 98.3 F (36.8 C) (Oral)   Resp 18   SpO2 97%   Visual Acuity Right Eye Distance:   Left Eye Distance:   Bilateral Distance:    Right Eye Near:   Left Eye Near:    Bilateral Near:     Physical Exam Vitals and nursing note reviewed.  Constitutional:      General: She is not in acute distress.    Appearance: She is well-developed.  HENT:     Head: Normocephalic.     Right Ear: Ear canal and external ear normal. A middle ear effusion is present.     Left Ear: Ear canal and external ear normal.     Nose:     Right Turbinates: Enlarged and swollen.     Left Turbinates: Enlarged and swollen.     Right Sinus: Maxillary sinus tenderness and frontal sinus tenderness present.     Left Sinus: No maxillary sinus tenderness or frontal sinus tenderness.     Mouth/Throat:     Lips: Pink.     Mouth: Mucous  membranes are moist.     Pharynx: Posterior oropharyngeal erythema present. No pharyngeal swelling, oropharyngeal exudate or uvula swelling.     Tonsils: No tonsillar exudate.  Eyes:     Extraocular Movements: Extraocular movements intact.     Conjunctiva/sclera: Conjunctivae normal.     Pupils: Pupils are equal, round, and reactive to light.  Cardiovascular:     Rate and Rhythm: Regular rhythm.     Heart sounds: Normal heart sounds.  Pulmonary:     Effort: Pulmonary effort is normal.     Breath sounds: Normal breath sounds.  Abdominal:     General: Bowel sounds are normal. There is no distension.     Palpations: Abdomen is soft.     Tenderness: There is no abdominal tenderness. There is no guarding or rebound.  Genitourinary:    Vagina: Normal. No vaginal discharge.  Musculoskeletal:     Cervical back: Normal range of motion.  Lymphadenopathy:     Cervical: No cervical adenopathy.  Skin:    General: Skin is warm and dry.     Findings: Rash present. No erythema.     Comments: Slightly raised patch of erythema noted to the right cheek.  Rash is erythematous.  There is no oozing, drainage, or fluctuance present.  Area is localized to the right cheek.  Neurological:     General: No focal deficit present.     Mental Status: She is alert and oriented to person, place, and time.     Cranial Nerves: No cranial nerve deficit.  Psychiatric:        Mood and Affect: Mood normal.        Behavior: Behavior normal.      UC Treatments / Results  Labs (all  labs ordered are listed, but only abnormal results are displayed) Labs Reviewed - No data to display  EKG   Radiology No results found.  Procedures Procedures (including critical care time)  Medications Ordered in UC Medications - No data to display  Initial Impression / Assessment and Plan / UC Course  I have reviewed the triage vital signs and the nursing notes.  Pertinent labs & imaging results that were available  during my care of the patient were reviewed by me and considered in my medical decision making (see chart for details).  Patient presents with right-sided facial pain/pressure and bilateral ear pain that been present for the past 3 to 4 days.  Patient also has since developed a rash to the right side of her face.  On exam, patient has tenderness to the right frontal and right maxillary sinuses.  There are no other obvious signs of bacterial etiology to include mucopurulent drainage.  Turbinates are enlarged and swollen.  Symptoms appear to be consistent with viral sinusitis based on her current presentation including her complaint of her teeth hurting.  With regard to the rash, there is no certain cause or etiology present.  Supportive care recommendations were provided to the patient.  We will start patient on Zyrtec-D and fluticasone for her symptoms.  Patient was also prescribed triamcinolone cream for the rash.  Was advised to follow-up if her symptoms fail to improve.  Patient verbalizes understanding.  Patient was reassured that her symptoms should not interfere with the colonoscopy that is scheduled. Final Clinical Impressions(s) / UC Diagnoses   Final diagnoses:  Viral sinusitis  Rash and nonspecific skin eruption     Discharge Instructions      Take medication as directed. Use the Flonase you have at home daily as previously prescribed. Increase fluids and get plenty of rest. May take over-the-counter ibuprofen or Tylenol as needed for pain, fever, or general discomfort. Recommend normal saline nasal spray to help with nasal congestion throughout the day. For your cough, it may be helpful to use a humidifier at bedtime during sleep.  Use medication as prescribed. Avoid hot baths or showers while symptoms persist.  Recommend taking lukewarm baths. May apply cool cloths to the area to help with itching or discomfort. Avoid scratching, rubbing, or manipulating the areas while symptoms  persist. Recommend Aveeno colloidal oatmeal bath to use to help with drying and itching.  If your symptoms fail to improve within the next 7 to 10 days, please follow-up in our clinic or with your primary care physician.      ED Prescriptions     Medication Sig Dispense Auth. Provider   cetirizine-pseudoephedrine (ZYRTEC-D) 5-120 MG tablet Take 1 tablet by mouth daily. 30 tablet Kam Rahimi-Warren, Sadie Haber, NP   cetirizine-pseudoephedrine (ZYRTEC-D) 5-120 MG tablet Take 1 tablet by mouth daily. 30 tablet Shawn Dannenberg-Warren, Sadie Haber, NP   triamcinolone (KENALOG) 0.025 % cream Apply 1 Application topically 2 (two) times daily. 15 g Javares Kaufhold-Warren, Sadie Haber, NP      PDMP not reviewed this encounter.   Abran Cantor, NP 10/23/21 [phone]

## 2021-10-22 NOTE — Discharge Instructions (Addendum)
Take medication as directed. Use the Flonase you have at home daily as previously prescribed. Increase fluids and get plenty of rest. May take over-the-counter ibuprofen or Tylenol as needed for pain, fever, or general discomfort. Recommend normal saline nasal spray to help with nasal congestion throughout the day. For your cough, it may be helpful to use a humidifier at bedtime during sleep.  Use medication as prescribed. Avoid hot baths or showers while symptoms persist.  Recommend taking lukewarm baths. May apply cool cloths to the area to help with itching or discomfort. Avoid scratching, rubbing, or manipulating the areas while symptoms persist. Recommend Aveeno colloidal oatmeal bath to use to help with drying and itching.  If your symptoms fail to improve within the next 7 to 10 days, please follow-up in our clinic or with your primary care physician.

## 2021-10-22 NOTE — ED Triage Notes (Signed)
Pt reports she started having pain in the right ear 3-4 days ago, the right sided of face started feeling pressure and pain. Pt reports she started having a rash in the right side of the face; left ear started hurting today. Pt is concern as she is having a colonoscopy on 10/26/2021.

## 2021-10-25 DIAGNOSIS — B0233 Zoster keratitis: Secondary | ICD-10-CM | POA: Diagnosis not present

## 2021-10-26 ENCOUNTER — Ambulatory Visit (HOSPITAL_COMMUNITY): Payer: Medicare HMO | Admitting: Anesthesiology

## 2021-10-26 ENCOUNTER — Encounter (HOSPITAL_COMMUNITY)
Admission: RE | Disposition: A | Discharge status: Home / Self Care | Payer: Self-pay | Source: Home / Self Care | Attending: Emergency Medicine

## 2021-10-26 ENCOUNTER — Ambulatory Visit (HOSPITAL_COMMUNITY)
Admission: RE | Admit: 2021-10-26 | Discharge: 2021-10-26 | Disposition: A | Discharge status: Home / Self Care | Payer: Medicare HMO | Attending: Emergency Medicine | Admitting: Emergency Medicine

## 2021-10-26 ENCOUNTER — Telehealth (INDEPENDENT_AMBULATORY_CARE_PROVIDER_SITE_OTHER): Payer: Self-pay

## 2021-10-26 ENCOUNTER — Encounter (HOSPITAL_COMMUNITY): Payer: Self-pay | Admitting: Gastroenterology

## 2021-10-26 ENCOUNTER — Other Ambulatory Visit: Payer: Self-pay

## 2021-10-26 ENCOUNTER — Other Ambulatory Visit (INDEPENDENT_AMBULATORY_CARE_PROVIDER_SITE_OTHER): Payer: Self-pay

## 2021-10-26 DIAGNOSIS — F419 Anxiety disorder, unspecified: Secondary | ICD-10-CM | POA: Diagnosis not present

## 2021-10-26 DIAGNOSIS — I10 Essential (primary) hypertension: Secondary | ICD-10-CM | POA: Diagnosis not present

## 2021-10-26 DIAGNOSIS — R69 Illness, unspecified: Secondary | ICD-10-CM | POA: Diagnosis not present

## 2021-10-26 DIAGNOSIS — Z800 Family history of malignant neoplasm of digestive organs: Secondary | ICD-10-CM | POA: Insufficient documentation

## 2021-10-26 DIAGNOSIS — E785 Hyperlipidemia, unspecified: Secondary | ICD-10-CM | POA: Insufficient documentation

## 2021-10-26 DIAGNOSIS — Z1211 Encounter for screening for malignant neoplasm of colon: Secondary | ICD-10-CM

## 2021-10-26 DIAGNOSIS — M069 Rheumatoid arthritis, unspecified: Secondary | ICD-10-CM | POA: Diagnosis not present

## 2021-10-26 DIAGNOSIS — E039 Hypothyroidism, unspecified: Secondary | ICD-10-CM | POA: Insufficient documentation

## 2021-10-26 DIAGNOSIS — R000 Tachycardia, unspecified: Secondary | ICD-10-CM | POA: Diagnosis not present

## 2021-10-26 DIAGNOSIS — K219 Gastro-esophageal reflux disease without esophagitis: Secondary | ICD-10-CM | POA: Insufficient documentation

## 2021-10-26 DIAGNOSIS — F32A Depression, unspecified: Secondary | ICD-10-CM | POA: Insufficient documentation

## 2021-10-26 DIAGNOSIS — G8929 Other chronic pain: Secondary | ICD-10-CM

## 2021-10-26 DIAGNOSIS — R9431 Abnormal electrocardiogram [ECG] [EKG]: Secondary | ICD-10-CM

## 2021-10-26 DIAGNOSIS — I471 Supraventricular tachycardia: Secondary | ICD-10-CM

## 2021-10-26 DIAGNOSIS — I4719 Other supraventricular tachycardia: Secondary | ICD-10-CM

## 2021-10-26 HISTORY — DX: Zoster without complications: B02.9

## 2021-10-26 LAB — TSH: TSH: 3.473 u[IU]/mL (ref 0.350–4.500)

## 2021-10-26 LAB — CBC WITH DIFFERENTIAL/PLATELET
Abs Immature Granulocytes: 0.04 10*3/uL (ref 0.00–0.07)
Basophils Absolute: 0 10*3/uL (ref 0.0–0.1)
Basophils Relative: 1 %
Eosinophils Absolute: 0 10*3/uL (ref 0.0–0.5)
Eosinophils Relative: 0 %
HCT: 44.2 % (ref 36.0–46.0)
Hemoglobin: 14.8 g/dL (ref 12.0–15.0)
Immature Granulocytes: 1 %
Lymphocytes Relative: 12 %
Lymphs Abs: 0.7 10*3/uL (ref 0.7–4.0)
MCH: 31.9 pg (ref 26.0–34.0)
MCHC: 33.5 g/dL (ref 30.0–36.0)
MCV: 95.3 fL (ref 80.0–100.0)
Monocytes Absolute: 0.5 10*3/uL (ref 0.1–1.0)
Monocytes Relative: 10 %
Neutro Abs: 4 10*3/uL (ref 1.7–7.7)
Neutrophils Relative %: 76 %
Platelets: 173 10*3/uL (ref 150–400)
RBC: 4.64 MIL/uL (ref 3.87–5.11)
RDW: 12.7 % (ref 11.5–15.5)
WBC: 5.3 10*3/uL (ref 4.0–10.5)
nRBC: 0 % (ref 0.0–0.2)

## 2021-10-26 LAB — BASIC METABOLIC PANEL
Anion gap: 9 (ref 5–15)
BUN: 10 mg/dL (ref 6–20)
CO2: 24 mmol/L (ref 22–32)
Calcium: 8.5 mg/dL — ABNORMAL LOW (ref 8.9–10.3)
Chloride: 105 mmol/L (ref 98–111)
Creatinine, Ser: 1.11 mg/dL — ABNORMAL HIGH (ref 0.44–1.00)
GFR, Estimated: >60 mL/min (ref >60)
Glucose, Bld: 96 mg/dL (ref 70–99)
Potassium: 3.7 mmol/L (ref 3.5–5.1)
Sodium: 138 mmol/L (ref 135–145)

## 2021-10-26 LAB — MAGNESIUM: Magnesium: 1.8 mg/dL (ref 1.7–2.4)

## 2021-10-26 SURGERY — COLONOSCOPY WITH PROPOFOL
Anesthesia: Monitor Anesthesia Care

## 2021-10-26 MED ORDER — PEG 3350-KCL-NA BICARB-NACL 420 G PO SOLR: 4000.0000 mL | ORAL | 0 refills | Status: DC

## 2021-10-26 MED ORDER — LACTATED RINGERS IV SOLN: INTRAVENOUS | Status: DC

## 2021-10-26 MED ORDER — METOPROLOL TARTRATE 25 MG PO TABS: 25.0000 mg | ORAL_TABLET | Freq: Two times a day (BID) | ORAL | 1 refills | Status: DC

## 2021-10-26 MED ORDER — METOPROLOL TARTRATE 25 MG PO TABS
25.0000 mg | ORAL_TABLET | Freq: Once | ORAL | Status: AC
  Administered 2021-10-26: 25 mg via ORAL
  Filled 2021-10-26: qty 1

## 2021-10-26 NOTE — Discharge Instructions (Signed)
Your work-up today was overall reassuring.  We discussed your case with cardiology who states that from cardiology standpoint you can proceed with colonoscopy tomorrow.  They will put a note in the chart reflecting this.  They also recommended starting you on a medication called metoprolol twice daily.  Have sent this medication into the pharmacy for you.  I have also let Dr. Wilburt Finlay office know regarding cardiology's recommendation.  They will send the additional bowel prep and to your pharmacy.  Please call their office to see when you need to present tomorrow for your procedure.  For any concerning symptoms return to the emergency room for evaluation.

## 2021-10-26 NOTE — H&P (Signed)
Jessica Pacheco is an 46 y.o. female.   Chief Complaint: Colorectal cancer screening and family history of colon cancer HPI: 46 year old female with past medical history of anxiety, depression, GERD, hypertension, hypothyroidism, rheumatoid arthritis, recent episode of shingles, osteoarthritis, coming for evaluation of family history of colon cancer.    he patient has never had a colonoscopy in the past.  The patient denies having any complaints such as melena, hematochezia, abdominal pain or distention, change in her bowel movement consistency or frequency, no changes in weight recently.  Father was diagnosed with colon cancer in early 73s and her aunt was diagnosed with colon cancer in her 52s.   Past Medical History:  Diagnosis Date   Anxiety    Chronic right lower quadrant pain    DDD (degenerative disc disease), cervical    c6-7   Depression    Dyspnea    On exertion at times   GERD (gastroesophageal reflux disease)    WATCHES DIET   History of hypoglycemia    History of kidney stones    passed spontaneously   Hypertension    Hypothyroidism    OA (osteoarthritis)    knees , hips, feet   PONV (postoperative nausea and vomiting)    Rheumatoid arthritis(714.0) dx  2001  multiple sites   rheumotologist-  dr Molly Maduro gay w/ [facility]--- currently treated w/ oral arava and iv actemra   Shingles    SUI (stress urinary incontinence), female     Past Surgical History:  Procedure Laterality Date   DILATION AND CURETTAGE OF UTERUS     KNEE ARTHROSCOPY Right 1999   KNEE ARTHROSCOPY WITH MEDIAL MENISECTOMY Right 02/07/2018   Procedure: KNEE ARTHROSCOPY WITH PARTIAL LATERAL MENISECTOMY;  Surgeon: Vickki Hearing, MD;  Location: [facility];  Service: Orthopedics;  Laterality: Right;   LAPAROSCOPY N/A 02/16/2017   Procedure: LAPAROSCOPY DIAGNOSTIC;  Surgeon: Richarda Overlie, MD;  Location[facility];  Service: Gynecology;  Laterality: N/A;   TOTAL HIP ARTHROPLASTY Right  07/30/2020   Procedure: RIGHT TOTAL HIP ARTHROPLASTY ANTERIOR APPROACH;  Surgeon: Kathryne Hitch, MD;  Location: [facility];  Service: Orthopedics;  Laterality: Right;   WISDOM TOOTH EXTRACTION      Family History  Problem Relation Age of Onset   Colon cancer Father    Colon cancer Paternal Aunt    Social History:  reports that she has never smoked. She has never used smokeless tobacco. She reports that she does not drink alcohol and does not use drugs.  Allergies: No Known Allergies  Medications Prior to Admission  Medication Sig Dispense Refill   amLODipine (NORVASC) 10 MG tablet Take 10 mg by mouth at bedtime.  4   Ascorbic Acid (VITAMIN C PO) Take 1 tablet by mouth at bedtime.     celecoxib (CELEBREX) 100 MG capsule Take 100 mg by mouth 2 (two) times daily.  2   escitalopram (LEXAPRO) 20 MG tablet Take 20 mg by mouth in the morning.     gabapentin (NEURONTIN) 300 MG capsule Take 300 mg by mouth 3 (three) times daily.     hydrochlorothiazide (HYDRODIURIL) 25 MG tablet Take 25 mg by mouth daily after breakfast.     hydroxychloroquine (PLAQUENIL) 200 MG tablet Take 200 mg by mouth 2 (two) times daily.     ibuprofen (ADVIL) 200 MG tablet Take 800 mg by mouth every 8 (eight) hours as needed (pain.).     JUNEL FE 1/20 1-20 MG-MCG tablet Take 1 tablet by mouth  every evening. Continuous (only takes active pills)     levothyroxine (SYNTHROID) 100 MCG tablet Take 100 mcg by mouth daily before breakfast.     loratadine (CLARITIN) 10 MG tablet Take 10 mg by mouth daily as needed for allergies.     LORazepam (ATIVAN) 0.5 MG tablet Take 0.5 mg by mouth daily as needed for anxiety.     methocarbamol (ROBAXIN) 500 MG tablet TAKE 1 TABLET(500 MG) BY MOUTH EVERY 6 HOURS AS NEEDED FOR MUSCLE SPASMS 40 tablet 1   polyethylene glycol-electrolytes (TRILYTE) 420 g solution Take 4,000 mLs by mouth as directed. 4000 mL 0   potassium chloride SA (KLOR-CON M) 20 MEQ tablet Take 2 tablets (40 mEq total) by  mouth daily for 4 days. 8 tablet 0   tiZANidine (ZANAFLEX) 4 MG tablet Take 1 tablet (4 mg total) by mouth every 8 (eight) hours as needed for muscle spasms. (Patient taking differently: Take 4 mg by mouth at bedtime as needed for muscle spasms.) 40 tablet 1   Upadacitinib ER (RINVOQ) 15 MG TB24 Take 15 mg by mouth in the morning.     valACYclovir (VALTREX) 1000 MG tablet Take 1,000 mg by mouth 3 (three) times daily.     cetirizine-pseudoephedrine (ZYRTEC-D) 5-120 MG tablet Take 1 tablet by mouth daily. 30 tablet 0   cetirizine-pseudoephedrine (ZYRTEC-D) 5-120 MG tablet Take 1 tablet by mouth daily. 30 tablet 0   melatonin 3 MG TABS tablet Take 3 mg by mouth at bedtime as needed (sleep).     oxyCODONE (OXY IR/ROXICODONE) 5 MG immediate release tablet Take 1 tablet (5 mg total) by mouth every 6 (six) hours as needed for moderate pain (pain score 4-6). 30 tablet 0   predniSONE (DELTASONE) 50 MG tablet Take one tablet daily for 5 days. (Patient not taking: Reported on 10/20/2021) 5 tablet 0   triamcinolone (KENALOG) 0.025 % cream Apply 1 Application topically 2 (two) times daily. 15 g 0    No results found for this or any previous visit (from the past 48 hour(s)). No results found.  Review of Systems  Allergic/Immunologic: Food allergies: .Marland Kitchen  All other systems reviewed and are negative.   Blood pressure (!) 142/78, pulse (!) 110, temperature 98.4 F (36.9 C), temperature source Oral, resp. rate 14, height 5\' 6"  (1.676 m), weight 106.1 kg, SpO2 97 %. Physical Exam  GENERAL: The patient is AO x3, in no acute distress. HEENT: Head is normocephalic and atraumatic. EOMI are intact. Mouth is well hydrated and without lesions. NECK: Supple. No masses LUNGS: Clear to auscultation. No presence of rhonchi/wheezing/rales. Adequate chest expansion HEART: RRR, normal s1 and s2. ABDOMEN: Soft, nontender, no guarding, no peritoneal signs, and nondistended. BS +. No masses. EXTREMITIES: Without any  cyanosis, clubbing, rash, lesions or edema. NEUROLOGIC: AOx3, no focal motor deficit. SKIN: no jaundice, no rashes  Assessment/Plan 46 year old female with past medical history of anxiety, depression, GERD, hypertension, hypothyroidism, rheumatoid arthritis, recent episode of shingles, osteoarthritis, coming for evaluation of family history of colon cancer.  We will proceed with colonoscopy.  54, MD 10/26/2021, 11:08 AM

## 2021-10-26 NOTE — Telephone Encounter (Signed)
Jonathan [place] Lebeau, CMA  ?

## 2021-10-26 NOTE — ED Provider Notes (Signed)
[facility] EMERGENCY DEPARTMENT Provider Note   CSN: [id number] Arrival date & time: 10/26/21  1221     History  Chief Complaint  Patient presents with   EKG Changes    Jessica Pacheco is a 46 y.o. female.  46 year old female with past medical history significant for hypertension, hyperlipidemia recently diagnosed with shingles presents today for concern of EKG changes from the colonoscopy suite.  Patient was undergoing screening colonoscopy today 1 prior to the procedure she was noted to have EKG changes.  Patient denies chest pain, shortness of breath, palpitations.  She does state that on 9/1 she had routine blood work done which showed potassium of 2.8.  She was started on potassium supplement of 40 mg twice daily at that time.  Denies significant family cardiac history.  The history is provided by the patient. No language interpreter was used.       Home Medications Prior to Admission medications   Medication Sig Start Date End Date Taking? Authorizing Provider  amLODipine (NORVASC) 10 MG tablet Take 10 mg by mouth at bedtime. 01/07/18  Yes [provider]  Ascorbic Acid (VITAMIN C PO) Take 1 tablet by mouth at bedtime.   Yes [provider]  celecoxib (CELEBREX) 100 MG capsule Take 100 mg by mouth 2 (two) times daily. 01/01/18  Yes [provider]  escitalopram (LEXAPRO) 20 MG tablet Take 20 mg by mouth in the morning. 09/17/21  Yes [provider]  gabapentin (NEURONTIN) 300 MG capsule Take 300 mg by mouth 3 (three) times daily.   Yes [provider]  hydrochlorothiazide (HYDRODIURIL) 25 MG tablet Take 25 mg by mouth daily after breakfast. 04/09/18  Yes [provider]  hydroxychloroquine (PLAQUENIL) 200 MG tablet Take 200 mg by mouth 2 (two) times daily. 02/11/20  Yes [provider]  ibuprofen (ADVIL) 200 MG tablet Take 800 mg by mouth every 8 (eight) hours as needed (pain.).   Yes [provider]  JUNEL  FE 1/20 1-20 MG-MCG tablet Take 1 tablet by mouth every evening. Continuous (only takes active pills) 09/17/21  Yes [provider]  levothyroxine (SYNTHROID) 100 MCG tablet Take 100 mcg by mouth daily before breakfast. 06/24/19  Yes [provider]  loratadine (CLARITIN) 10 MG tablet Take 10 mg by mouth daily as needed for allergies.   Yes [provider]  LORazepam (ATIVAN) 0.5 MG tablet Take 0.5 mg by mouth daily as needed for anxiety. 10/15/21  Yes [provider]  methocarbamol (ROBAXIN) 500 MG tablet TAKE 1 TABLET(500 MG) BY MOUTH EVERY 6 HOURS AS NEEDED FOR MUSCLE SPASMS 11/18/20  Yes Kathryne Hitch, MD  polyethylene glycol-electrolytes (TRILYTE) 420 g solution Take 4,000 mLs by mouth as directed. 09/26/21  Yes Dolores Frame, MD  potassium chloride SA (KLOR-CON M) 20 MEQ tablet Take 2 tablets (40 mEq total) by mouth daily for 4 days. 10/21/21 10/26/21 Yes Dolores Frame, MD  tiZANidine (ZANAFLEX) 4 MG tablet Take 1 tablet (4 mg total) by mouth every 8 (eight) hours as needed for muscle spasms. Patient taking differently: Take 4 mg by mouth at bedtime as needed for muscle spasms. 10/12/21  Yes Kathryne Hitch, MD  Upadacitinib ER (RINVOQ) 15 MG TB24 Take 15 mg by mouth in the morning. 08/31/21  Yes [provider]  valACYclovir (VALTREX) 1000 MG tablet Take 1,000 mg by mouth 3 (three) times daily.   Yes [provider]  cetirizine-pseudoephedrine (ZYRTEC-D) 5-120 MG tablet Take 1 tablet  by mouth daily. 10/22/21   Leath-Warren, Sadie Haber, NP  cetirizine-pseudoephedrine (ZYRTEC-D) 5-120 MG tablet Take 1 tablet by mouth daily. 10/22/21   Leath-Warren, Sadie Haber, NP  melatonin 3 MG TABS tablet Take 3 mg by mouth at bedtime as needed (sleep).    [provider]  oxyCODONE (OXY IR/ROXICODONE) 5 MG immediate release tablet Take 1 tablet (5 mg total) by mouth every 6 (six) hours as needed for moderate pain (pain score  4-6). 09/09/20   Kathryne Hitch, MD  predniSONE (DELTASONE) 50 MG tablet Take one tablet daily for 5 days. Patient not taking: Reported on 10/20/2021 10/12/21   Kathryne Hitch, MD  triamcinolone (KENALOG) 0.025 % cream Apply 1 Application topically 2 (two) times daily. 10/22/21   Leath-Warren, Sadie Haber, NP      Allergies    Patient has no known allergies.    Review of Systems   Review of Systems  Constitutional:  Negative for chills and fever.  Respiratory:  Negative for shortness of breath.   Cardiovascular:  Negative for chest pain, palpitations and leg swelling.  Gastrointestinal:  Negative for abdominal pain and nausea.  Skin:  Positive for rash.  Neurological:  Negative for light-headedness.  All other systems reviewed and are negative.   Physical Exam Updated Vital Signs BP 127/86   Pulse (!) 101   Temp 98.1 F (36.7 C) (Oral)   Resp 15   Ht  (1.676 m)   Wt 106.1 kg   SpO2 96%   BMI 37.77 kg/m  Physical Exam Vitals and nursing note reviewed.  Constitutional:      General: She is not in acute distress.    Appearance: Normal appearance. She is not ill-appearing.  HENT:     Head: Normocephalic and atraumatic.     Right Ear: Tympanic membrane is erythematous.     Left Ear: Tympanic membrane is not erythematous, retracted or bulging.     Nose: Nose normal.  Eyes:     General: No scleral icterus.       Right eye: No discharge.        Left eye: No discharge.     Extraocular Movements: Extraocular movements intact.     Conjunctiva/sclera: Conjunctivae normal.     Pupils: Pupils are equal, round, and reactive to light.  Cardiovascular:     Rate and Rhythm: Regular rhythm. Tachycardia present.     Pulses: Normal pulses.  Pulmonary:     Effort: Pulmonary effort is normal. No respiratory distress.     Breath sounds: Normal breath sounds. No wheezing or rales.  Abdominal:     General: There is no distension.     Tenderness: There is no abdominal  tenderness.  Musculoskeletal:        General: Normal range of motion.     Cervical back: Normal range of motion.     Right lower leg: No edema.     Left lower leg: No edema.  Skin:    General: Skin is warm and dry.     Comments: Rash noted to right side of face consistent with herpes zoster.  Neurological:     General: No focal deficit present.     Mental Status: She is alert. Mental status is at baseline.     ED Results / Procedures / Treatments   Labs (all labs ordered are listed, but only abnormal results are displayed) Labs Reviewed  CBC WITH DIFFERENTIAL/PLATELET  BASIC METABOLIC PANEL  MAGNESIUM  I-STAT CHEM 8,  ED    EKG None  Radiology No results found.  Procedures Procedures    Medications Ordered in ED Medications - No data to display  ED Course/ Medical Decision Making/ A&P                           Medical Decision Making Amount and/or Complexity of Data Reviewed Labs: ordered.  Risk Prescription drug management.   Medical Decision Making / ED Course   This patient presents to the ED for concern of EKG change, this involves an extensive number of treatment options, and is a complaint that carries with it a high risk of complications and morbidity.  The differential diagnosis includes electrolyte derangement, ACS  MDM: 46 year old female presents gastroenterology suite for EKG changes.  Patient was about to undergo colonoscopy when they noticed the EKG changes.  They recommended ER visit for cardiology clearance and eval for acute concerns.  She did have hypokalemia prior to starting her bowel prep.  She had a potassium of 2.8.  This was repleted with 40 mg twice daily.  She is without chest pain, shortness of breath, palpitations, or other anginal symptoms.  Denies significant family history of cardiac disease.  Personal history includes hypertension, hyperlipidemia from cardiac standpoint. Case discussed with Dr. Wyline Mood who states that there is  nothing from cardiology standpoint that is keeping her from undergoing procedure.  He states he sees evidence of slow atrial tachycardia and recommends initiation of low-dose Lopressor at 25 mg twice daily.  He will place a note to reflect this.  Discussed with gastroenterology office will schedule patient for procedure tomorrow.  I will send additional bowel prep to her choice of pharmacy.  Discussed with patient.  She voices understanding and is in agreement with the plan.  No concerning cause of EKG changes.  Lopressor sent to patient's choice of pharmacy.  Cardiology will schedule a clinic follow-up appointment for her.  Patient diagnosed with herpes zoster to her face affecting her right ear by PCP yesterday.  Started on Valtrex, and gabapentin.  Denies hearing change, or any changes since starting medications.  Lab Tests: -I ordered, reviewed, and interpreted labs.   The pertinent results include:   Labs Reviewed  BASIC METABOLIC PANEL - Abnormal; Notable for the following components:      Result Value   Creatinine, Ser 1.11 (*)    Calcium 8.5 (*)    All other components within normal limits  CBC WITH DIFFERENTIAL/PLATELET  MAGNESIUM  TSH      EKG  EKG Interpretation  Date/Time:    Ventricular Rate:    PR Interval:    QRS Duration:   QT Interval:    QTC Calculation:   R Axis:     Text Interpretation:          Medicines ordered and prescription drug management: Meds ordered this encounter  Medications   DISCONTD: lactated ringers infusion   metoprolol tartrate (LOPRESSOR) tablet 25 mg    -I have reviewed the patients home medicines and have made adjustments as needed  Cardiac Monitoring: The patient was maintained on a cardiac monitor.  I personally viewed and interpreted the cardiac monitored which showed an underlying rhythm of: sinus tachycardia  Reevaluation: After the interventions noted above, I reevaluated the patient and found that they have :stayed the  same Patient remained asymptomatic throughout her emergency room stay.  Co morbidities that complicate the patient evaluation  Past Medical History:  Diagnosis Date   Anxiety    Chronic right lower quadrant pain    DDD (degenerative disc disease), cervical    c6-7   Depression    Dyspnea    On exertion at times   GERD (gastroesophageal reflux disease)    WATCHES DIET   History of hypoglycemia    History of kidney stones    passed spontaneously   Hypertension    Hypothyroidism    OA (osteoarthritis)    knees , hips, feet   PONV (postoperative nausea and vomiting)    Rheumatoid arthritis(714.0) dx  2001  multiple sites   rheumotologist-  dr Molly Maduro gay w/ [facility]--- currently treated w/ oral arava and iv actemra   Shingles    SUI (stress urinary incontinence), female       Dispostion: Patient stable for discharge.  Discharged in stable condition.  Return precautions discussed.  on, treatments were needed:1} Final Clinical Impression(s) / ED Diagnoses Final diagnoses:  Abnormal EKG    Rx / DC Orders ED Discharge Orders          Ordered    metoprolol tartrate (LOPRESSOR) 25 MG tablet  2 times daily        10/26/21 1517              Marita Kansas, PA-C 10/26/21 1523    Gloris Manchester, MD 10/26/21 [phone]

## 2021-10-26 NOTE — ED Triage Notes (Signed)
Pt brought over by Endoscopy Charge RN due to pt having EKG changes in pre-op. Pt denies SOB and CP. Anesthesia noticed pt had inverted P waves and no T waves on the cardiac monitor prior to her Colonoscopy. Per report, pt needs Cardiology clearance prior to pt returning for her Colonoscopy. If pt is cleared by Cardiology today, Endoscopy Charge RN reports to please call Dr. Levon Hedger for her to get a prescription for another half of her bowel prep so she could have her procedure tomorrow.

## 2021-10-26 NOTE — Progress Notes (Signed)
Patient was tachycardic upon arrival. 12 lead EKG requested by Dr. Alva Garnet. Patient noted to have changes on 12 lead when compared to 07/2020 12 lead EKG. Procedure cancelled for today. Patient transported to ED via stretcher per Dr. Einar Crow request.

## 2021-10-26 NOTE — ED Notes (Addendum)
Pt alert, NAD, calm, interactive, resps e/u, speaking in clear complete sentences. Denies pain, sx or complaints. Family at [facility]. ST on monitor, HR 103, takes birthcontrol, also levothyroxine, among other meds. Non-smoker.

## 2021-10-26 NOTE — Progress Notes (Signed)
Procedure was canceled as the patient presented new P wave changes in EKG.  She will need to get cardiology clearance per anesthesiology Recs.    Patient will be taken to the ER for further evaluation. 

## 2021-10-26 NOTE — H&P (View-Only) (Signed)
Procedure was canceled as the patient presented new P wave changes in EKG.  She will need to get cardiology clearance per anesthesiology Recs.    Patient will be taken to the ER for further evaluation.

## 2021-10-26 NOTE — Progress Notes (Signed)
Patient discussed with ER staff, presented for outpatient endoscopy. Noted to have an abnormal EKG preoprocedure and sent to ER for evaluation. EKG shows ectopic atrial rhythm low 100s/atach. From discussion with ER has not had any sustained very high rates and patient without symptoms. K 3.7, Mg 1.8.   New diagnosis atach low 100s not symptomatic. Would start lopressor 25mg  bid, keep K at 4 Mg at 2, add on TSH. This rhythm would not prohibit her from proceeding with her endoscopy and is overall low risk finding, management is solely beta blocker and outpatient cardiology follow up. We will arrange outpatient appt.      MD

## 2021-10-26 NOTE — ED Notes (Signed)
ED PA at BS 

## 2021-10-26 NOTE — Anesthesia Preprocedure Evaluation (Addendum)
Anesthesia Evaluation  Patient identified by MRN, date of birth, ID band Patient awake    Reviewed: Allergy & Precautions, NPO status , Patient's Chart, lab work & pertinent test results  History of Anesthesia Complications (+) PONV and history of anesthetic complications  Airway Mallampati: III  TM Distance: >3 FB Neck ROM: Full   Comment: DDD (degenerative disc disease), cervical  c6-7  Dental  (+) Dental Advisory Given, Teeth Intact   Pulmonary shortness of breath and with exertion,    Pulmonary exam normal breath sounds clear to auscultation       Cardiovascular hypertension, Pt. on medications  Rhythm:Regular Rate:Tachycardia  23-Jul-2020 09:46:38 [facility]-PRE ROUTINE RECORD 1975/10/24 (44 yr) Female Caucasian Vent. rate 83 BPM PR interval 158 ms QRS duration 80 ms QT/QTcB 390/458 ms P-R-T axes 52 70 58 Normal sinus rhythm Low voltage QRS Cannot rule out Anterior infarct , age undetermined Abnormal ECG Confirmed by Tonny Bollman [phone]) on 07/23/2020 5:30:35 PM   Neuro/Psych  Headaches, PSYCHIATRIC DISORDERS Anxiety Depression    GI/Hepatic GERD  Medicated,  Endo/Other  Hypothyroidism   Renal/GU      Musculoskeletal  (+) Arthritis , Osteoarthritis and Rheumatoid disorders,    Abdominal   Peds  Hematology   Anesthesia Other Findings   Reproductive/Obstetrics                            Anesthesia Physical Anesthesia Plan  ASA: 2  Anesthesia Plan: General   Post-op Pain Management: Minimal or no pain anticipated   Induction: Intravenous  PONV Risk Score and Plan: TIVA  Airway Management Planned:   Additional Equipment:   Intra-op Plan:   Post-operative Plan:   Informed Consent: I have reviewed the patients History and Physical, chart, labs and discussed the procedure including the risks, benefits and alternatives for the proposed anesthesia with  the patient or authorized representative who has indicated his/her understanding and acceptance.     Dental advisory given  Plan Discussed with: CRNA and Surgeon  Anesthesia Plan Comments: (p wave inversions in 3 lead EKG, will do 12 lead EKG before proceeding to provide anesthesia, 12 LEAD showed new inverted p waves, will transfer the patient to ED for further evaluation and treatment.)      Anesthesia Quick Evaluation

## 2021-10-27 ENCOUNTER — Encounter (HOSPITAL_COMMUNITY): Payer: Self-pay | Admitting: Gastroenterology

## 2021-10-27 ENCOUNTER — Ambulatory Visit (HOSPITAL_COMMUNITY): Payer: Medicare HMO | Admitting: Anesthesiology

## 2021-10-27 ENCOUNTER — Ambulatory Visit (HOSPITAL_BASED_OUTPATIENT_CLINIC_OR_DEPARTMENT_OTHER): Payer: Medicare HMO | Admitting: Anesthesiology

## 2021-10-27 ENCOUNTER — Ambulatory Visit (HOSPITAL_COMMUNITY)
Admission: RE | Admit: 2021-10-27 | Discharge: 2021-10-27 | Disposition: A | Discharge status: Home / Self Care | Payer: Medicare HMO | Attending: Gastroenterology | Admitting: Gastroenterology

## 2021-10-27 ENCOUNTER — Encounter (HOSPITAL_COMMUNITY)
Admission: RE | Disposition: A | Discharge status: Home / Self Care | Payer: Self-pay | Source: Home / Self Care | Attending: Gastroenterology

## 2021-10-27 DIAGNOSIS — Z800 Family history of malignant neoplasm of digestive organs: Secondary | ICD-10-CM | POA: Diagnosis not present

## 2021-10-27 DIAGNOSIS — E039 Hypothyroidism, unspecified: Secondary | ICD-10-CM | POA: Insufficient documentation

## 2021-10-27 DIAGNOSIS — I10 Essential (primary) hypertension: Secondary | ICD-10-CM

## 2021-10-27 DIAGNOSIS — Z1211 Encounter for screening for malignant neoplasm of colon: Secondary | ICD-10-CM | POA: Insufficient documentation

## 2021-10-27 DIAGNOSIS — Z79899 Other long term (current) drug therapy: Secondary | ICD-10-CM | POA: Insufficient documentation

## 2021-10-27 DIAGNOSIS — F418 Other specified anxiety disorders: Secondary | ICD-10-CM

## 2021-10-27 DIAGNOSIS — M069 Rheumatoid arthritis, unspecified: Secondary | ICD-10-CM | POA: Insufficient documentation

## 2021-10-27 DIAGNOSIS — M1990 Unspecified osteoarthritis, unspecified site: Secondary | ICD-10-CM | POA: Insufficient documentation

## 2021-10-27 DIAGNOSIS — K219 Gastro-esophageal reflux disease without esophagitis: Secondary | ICD-10-CM | POA: Insufficient documentation

## 2021-10-27 DIAGNOSIS — F32A Depression, unspecified: Secondary | ICD-10-CM | POA: Insufficient documentation

## 2021-10-27 DIAGNOSIS — K644 Residual hemorrhoidal skin tags: Secondary | ICD-10-CM | POA: Insufficient documentation

## 2021-10-27 DIAGNOSIS — R69 Illness, unspecified: Secondary | ICD-10-CM | POA: Diagnosis not present

## 2021-10-27 DIAGNOSIS — K648 Other hemorrhoids: Secondary | ICD-10-CM

## 2021-10-27 DIAGNOSIS — R9431 Abnormal electrocardiogram [ECG] [EKG]: Secondary | ICD-10-CM | POA: Insufficient documentation

## 2021-10-27 DIAGNOSIS — F419 Anxiety disorder, unspecified: Secondary | ICD-10-CM | POA: Diagnosis not present

## 2021-10-27 HISTORY — PX: COLONOSCOPY WITH PROPOFOL: SHX5780

## 2021-10-27 LAB — HM COLONOSCOPY

## 2021-10-27 SURGERY — COLONOSCOPY WITH PROPOFOL
Anesthesia: General

## 2021-10-27 MED ORDER — LIDOCAINE HCL (CARDIAC) PF 100 MG/5ML IV SOSY
PREFILLED_SYRINGE | INTRAVENOUS | Status: DC | PRN
  Administered 2021-10-27: 50 mg via INTRAVENOUS

## 2021-10-27 MED ORDER — PROPOFOL 10 MG/ML IV BOLUS
INTRAVENOUS | Status: DC | PRN
  Administered 2021-10-27 (×2): 50 mg via INTRAVENOUS

## 2021-10-27 MED ORDER — LACTATED RINGERS IV SOLN: INTRAVENOUS | Status: DC

## 2021-10-27 MED ORDER — PHENYLEPHRINE HCL-NACL 20-0.9 MG/250ML-% IV SOLN
INTRAVENOUS | Status: DC | PRN
  Administered 2021-10-27: 200 ug/min via INTRAVENOUS

## 2021-10-27 NOTE — Anesthesia Postprocedure Evaluation (Signed)
Anesthesia Post Note  Patient: Jessica Pacheco  Procedure(s) Performed: COLONOSCOPY WITH PROPOFOL  Patient location during evaluation: Phase II Anesthesia Type: General Level of consciousness: awake and alert and oriented Pain management: pain level controlled Vital Signs Assessment: post-procedure vital signs reviewed and stable Respiratory status: spontaneous breathing, nonlabored ventilation and respiratory function stable Cardiovascular status: blood pressure returned to baseline and stable Postop Assessment: no apparent nausea or vomiting Anesthetic complications: no   No notable events documented.   Last Vitals:  Vitals:   10/27/21 1314 10/27/21 1448  BP: (!) 129/90 103/69  Pulse: 85 72  Resp: 18 11  Temp: 36.7 C 36.6 C  SpO2: 97%     Last Pain:  Vitals:   10/27/21 1448  TempSrc: Oral  PainSc: 4                  Hatsuko Bizzarro C Mattthew Ziomek

## 2021-10-27 NOTE — Op Note (Signed)
[facility] Patient Name: Jessica Pacheco Procedure Date: 10/27/2021 2:10 PM MRN: [id number] Date of Birth: [birth year] Attending MD: Katrinka Blazing ,  CSN: [id number] Age: 46 Admit Type: Outpatient Procedure:                Colonoscopy Indications:              Screening patient at increased risk: Family history                            of 1st-degree relative with colorectal cancer at                            age 81 years (or older), Colon cancer screening in                            patient at increased risk: Family history of                            colorectal cancer in multiple 2nd degree relatives Providers:                Katrinka Blazing, Jannett Celestine, RN, Hinton Rao Referring MD:              Medicines:                Monitored Anesthesia Care Complications:            No immediate complications. Estimated Blood Loss:     Estimated blood loss: none. Procedure:                Pre-Anesthesia Assessment:                           - Prior to the procedure, a History and Physical                            was performed, and patient medications, allergies                            and sensitivities were reviewed. The patient's                            tolerance of previous anesthesia was reviewed.                           - The risks and benefits of the procedure and the                            sedation options and risks were discussed with the                            patient. All questions were answered and informed                            consent was obtained.                           -  ASA Grade Assessment: III - A patient with severe                            systemic disease.                           After obtaining informed consent, the colonoscope                            was passed under direct vision. Throughout the                            procedure, the patient's blood pressure, pulse, and                            oxygen  saturations were monitored continuously. The                            [id number]-[id number] ([id number]) scope was introduced through                            the anus and advanced to the the cecum, identified                            by appendiceal orifice and ileocecal valve. The                            colonoscopy was performed without difficulty. The                            patient tolerated the procedure well. The quality                            of the bowel preparation was good. Scope In: 2:27:11 PM Scope Out: 2:46:01 PM Scope Withdrawal Time: 0 hours 14 minutes 26 seconds  Total Procedure Duration: 0 hours 18 minutes 50 seconds  Findings:      Hemorrhoids were found on perianal exam.      The colon (entire examined portion) appeared normal.      Non-bleeding external and internal hemorrhoids were found during       retroflexion and during perianal exam. The hemorrhoids were moderate. Impression:               - Hemorrhoids found on perianal exam.                           - The entire examined colon is normal.                           - Non-bleeding external and internal hemorrhoids.                           - No specimens collected. Moderate Sedation:      Per Anesthesia Care Recommendation:           - Discharge patient to home (ambulatory).                           -  Resume previous diet.                           - Repeat colonoscopy in 5 years for screening                            purposes. Procedure Code(s):        --- Professional ---                           [id number], Colorectal cancer screening; colonoscopy on                            individual at high risk Diagnosis Code(s):        --- Professional ---                           K64.8, Other hemorrhoids                           Z80.0, Family history of malignant neoplasm of                            digestive organs CPT copyright 2019 [employer]. All rights reserved. The codes documented  in this report are preliminary and upon coder review may  be revised to meet current compliance requirements. Maylon Peppers, MD Maylon Peppers,  10/27/2021 2:54:11 PM This report has been signed electronically. Number of Addenda: 0

## 2021-10-27 NOTE — Interval H&P Note (Signed)
History and Physical Interval Note:  10/27/2021 12:59 PM  Patient cleared by cardiology yesterday.  Jessica Pacheco  has presented today for surgery, with the diagnosis of screening, family history of colon cancer.  The various methods of treatment have been discussed with the patient and family. After consideration of risks, benefits and other options for treatment, the patient has consented to  Procedure(s): COLONOSCOPY WITH PROPOFOL (N/A) as a surgical intervention.  The patient's history has been reviewed, patient examined, no change in status, stable for surgery.  I have reviewed the patient's chart and labs.  Questions were answered to the patient's satisfaction.     Jessica Pacheco

## 2021-10-27 NOTE — Transfer of Care (Signed)
Immediate Anesthesia Transfer of Care Note  Patient: LALENA SALAS  Procedure(s) Performed: COLONOSCOPY WITH PROPOFOL  Patient Location: PACU  Anesthesia Type:General  Level of Consciousness: awake, alert  and patient cooperative  Airway & Oxygen Therapy: Patient Spontanous Breathing  Post-op Assessment: Report given to RN, Post -op Vital signs reviewed and stable and Patient moving all extremities X 4  Post vital signs: Reviewed and stable  Last Vitals:  Vitals Value Taken Time  BP 103/69 10/27/21 1448  Temp 36.6 C 10/27/21 1448  Pulse 72 10/27/21 1448  Resp 11 10/27/21 1448  SpO2      Last Pain:  Vitals:   10/27/21 1448  TempSrc: Oral  PainSc: 4          Complications: No notable events documented.

## 2021-10-27 NOTE — Anesthesia Preprocedure Evaluation (Addendum)
Anesthesia Evaluation  Patient identified by MRN, date of birth, ID band  Reviewed: Allergy & Precautions, NPO status , Patient's Chart, lab work & pertinent test results  History of Anesthesia Complications (+) PONV and history of anesthetic complications  Airway Mallampati: III  TM Distance: >3 FB Neck ROM: Full    Dental  (+) Dental Advisory Given, Teeth Intact   Pulmonary shortness of breath and with exertion,           Cardiovascular Exercise Tolerance: Good hypertension, Pt. on medications   Procedure was cancelled yesterday because of Abnormal EKG. Cardiology reviewed  EKG and cleared for the procedure.   Neuro/Psych  Headaches, PSYCHIATRIC DISORDERS Anxiety Depression    GI/Hepatic GERD  Medicated and Controlled,  Endo/Other  Hypothyroidism   Renal/GU      Musculoskeletal  (+) Arthritis , Osteoarthritis and Rheumatoid disorders,    Abdominal   Peds  Hematology   Anesthesia Other Findings Patient discussed with ER staff, presented for outpatient endoscopy. Noted to have an abnormal EKG preoprocedure and sent to ER for evaluation. EKG shows ectopic atrial rhythm low 100s/atach. From discussion with ER has not had any sustained very high rates and patient without symptoms. K 3.7, Mg 1.8.   New diagnosis atach low 100s not symptomatic. Would start lopressor 25mg  bid, keep K at 4 Mg at 2, add on TSH. This rhythm would not prohibit her from proceeding with her endoscopy and is overall low risk finding, management is solely beta blocker and outpatient cardiology follow up. We will arrange outpatient appt.      MD   Reproductive/Obstetrics                             Anesthesia Physical Anesthesia Plan  ASA: 3  Anesthesia Plan: General   Post-op Pain Management: Minimal or no pain anticipated   Induction: Intravenous  PONV Risk Score and Plan: Propofol  infusion  Airway Management Planned: Nasal Cannula and Natural Airway  Additional Equipment:   Intra-op Plan:   Post-operative Plan:   Informed Consent: I have reviewed the patients History and Physical, chart, labs and discussed the procedure including the risks, benefits and alternatives for the proposed anesthesia with the patient or authorized representative who has indicated his/her understanding and acceptance.     Dental advisory given  Plan Discussed with: CRNA and Surgeon  Anesthesia Plan Comments:        Anesthesia Quick Evaluation

## 2021-10-27 NOTE — Discharge Instructions (Signed)
You are being discharged to home.  Resume your previous diet.  Your physician has recommended a repeat colonoscopy in five years for screening purposes.  

## 2021-10-31 ENCOUNTER — Encounter (INDEPENDENT_AMBULATORY_CARE_PROVIDER_SITE_OTHER): Payer: Self-pay | Admitting: *Deleted

## 2021-11-01 NOTE — H&P (Signed)
Jessica Pacheco is an 46 y.o. female.   Chief Complaint: Colorectal cancer screening and family history of colon cancer HPI: 46 year old female with past medical history of anxiety, depression, GERD, hypertension, hypothyroidism, rheumatoid arthritis, recent episode of shingles, osteoarthritis, coming for evaluation of family history of colon cancer.     he patient has never had a colonoscopy in the past.  The patient denies having any complaints such as melena, hematochezia, abdominal pain or distention, change in her bowel movement consistency or frequency, no changes in weight recently.  Father was diagnosed with colon cancer in early 22s and her aunt was diagnosed with colon cancer in her 79s.         Past Medical History:  Diagnosis Date   Anxiety     Chronic right lower quadrant pain     DDD (degenerative disc disease), cervical      c6-7   Depression     Dyspnea      On exertion at times   GERD (gastroesophageal reflux disease)      WATCHES DIET   History of hypoglycemia     History of kidney stones      passed spontaneously   Hypertension     Hypothyroidism     OA (osteoarthritis)      knees , hips, feet   PONV (postoperative nausea and vomiting)     Rheumatoid arthritis(714.0) dx  2001  multiple sites    rheumotologist-  dr Molly Maduro gay w/ [facility]--- currently treated w/ oral arava and iv actemra   Shingles     SUI (stress urinary incontinence), female             Past Surgical History:  Procedure Laterality Date   DILATION AND CURETTAGE OF UTERUS       KNEE ARTHROSCOPY Right 1999   KNEE ARTHROSCOPY WITH MEDIAL MENISECTOMY Right 02/07/2018    Procedure: KNEE ARTHROSCOPY WITH PARTIAL LATERAL MENISECTOMY;  Surgeon: Vickki Hearing, MD;  Location: [facility];  Service: Orthopedics;  Laterality: Right;   LAPAROSCOPY N/A 02/16/2017    Procedure: LAPAROSCOPY DIAGNOSTIC;  Surgeon: Richarda Overlie, MD;  Location: [facility];  Service: Gynecology;  Laterality:  N/A;   TOTAL HIP ARTHROPLASTY Right 07/30/2020    Procedure: RIGHT TOTAL HIP ARTHROPLASTY ANTERIOR APPROACH;  Surgeon: Kathryne Hitch, MD;  Location: [facility];  Service: Orthopedics;  Laterality: Right;   WISDOM TOOTH EXTRACTION               Family History  Problem Relation Age of Onset   Colon cancer Father     Colon cancer Paternal Aunt      Social History:  reports that she has never smoked. She has never used smokeless tobacco. She reports that she does not drink alcohol and does not use drugs.   Allergies: No Known Allergies         Medications Prior to Admission  Medication Sig Dispense Refill   amLODipine (NORVASC) 10 MG tablet Take 10 mg by mouth at bedtime.   4   Ascorbic Acid (VITAMIN C PO) Take 1 tablet by mouth at bedtime.       celecoxib (CELEBREX) 100 MG capsule Take 100 mg by mouth 2 (two) times daily.   2   escitalopram (LEXAPRO) 20 MG tablet Take 20 mg by mouth in the morning.       gabapentin (NEURONTIN) 300 MG capsule Take 300 mg by mouth 3 (three) times daily.  hydrochlorothiazide (HYDRODIURIL) 25 MG tablet Take 25 mg by mouth daily after breakfast.       hydroxychloroquine (PLAQUENIL) 200 MG tablet Take 200 mg by mouth 2 (two) times daily.       ibuprofen (ADVIL) 200 MG tablet Take 800 mg by mouth every 8 (eight) hours as needed (pain.).       JUNEL FE 1/20 1-20 MG-MCG tablet Take 1 tablet by mouth every evening. Continuous (only takes active pills)       levothyroxine (SYNTHROID) 100 MCG tablet Take 100 mcg by mouth daily before breakfast.       loratadine (CLARITIN) 10 MG tablet Take 10 mg by mouth daily as needed for allergies.       LORazepam (ATIVAN) 0.5 MG tablet Take 0.5 mg by mouth daily as needed for anxiety.       methocarbamol (ROBAXIN) 500 MG tablet TAKE 1 TABLET(500 MG) BY MOUTH EVERY 6 HOURS AS NEEDED FOR MUSCLE SPASMS 40 tablet 1   polyethylene glycol-electrolytes (TRILYTE) 420 g solution Take 4,000 mLs by mouth as directed. 4000 mL 0    potassium chloride SA (KLOR-CON M) 20 MEQ tablet Take 2 tablets (40 mEq total) by mouth daily for 4 days. 8 tablet 0   tiZANidine (ZANAFLEX) 4 MG tablet Take 1 tablet (4 mg total) by mouth every 8 (eight) hours as needed for muscle spasms. (Patient taking differently: Take 4 mg by mouth at bedtime as needed for muscle spasms.) 40 tablet 1   Upadacitinib ER (RINVOQ) 15 MG TB24 Take 15 mg by mouth in the morning.       valACYclovir (VALTREX) 1000 MG tablet Take 1,000 mg by mouth 3 (three) times daily.       cetirizine-pseudoephedrine (ZYRTEC-D) 5-120 MG tablet Take 1 tablet by mouth daily. 30 tablet 0   cetirizine-pseudoephedrine (ZYRTEC-D) 5-120 MG tablet Take 1 tablet by mouth daily. 30 tablet 0   melatonin 3 MG TABS tablet Take 3 mg by mouth at bedtime as needed (sleep).       oxyCODONE (OXY IR/ROXICODONE) 5 MG immediate release tablet Take 1 tablet (5 mg total) by mouth every 6 (six) hours as needed for moderate pain (pain score 4-6). 30 tablet 0   predniSONE (DELTASONE) 50 MG tablet Take one tablet daily for 5 days. (Patient not taking: Reported on 10/20/2021) 5 tablet 0   triamcinolone (KENALOG) 0.025 % cream Apply 1 Application topically 2 (two) times daily. 15 g 0      Lab Results Last 48 Hours  No results found for this or any previous visit (from the past 48 hour(s)).   Imaging Results (Last 48 hours)  No results found.     Review of Systems  Allergic/Immunologic: Food allergies: .Marland Kitchen  All other systems reviewed and are negative.     Blood pressure (!) 142/78, pulse (!) 110, temperature 98.4 F (36.9 C), temperature source Oral, resp. rate 14, height 5\' 6"  (1.676 m), weight 106.1 kg, SpO2 97 %. Physical Exam  GENERAL: The patient is AO x3, in no acute distress. HEENT: Head is normocephalic and atraumatic. EOMI are intact. Mouth is well hydrated and without lesions. NECK: Supple. No masses LUNGS: Clear to auscultation. No presence of rhonchi/wheezing/rales. Adequate chest  expansion HEART: RRR, normal s1 and s2. ABDOMEN: Soft, nontender, no guarding, no peritoneal signs, and nondistended. BS +. No masses. EXTREMITIES: Without any cyanosis, clubbing, rash, lesions or edema. NEUROLOGIC: AOx3, no focal motor deficit. SKIN: no jaundice, no rashes   Assessment/Plan 46 year old  female with past medical history of anxiety, depression, GERD, hypertension, hypothyroidism, rheumatoid arthritis, recent episode of shingles, osteoarthritis, coming for evaluation of family history of colon cancer.  We will proceed with colonoscopy.

## 2021-11-04 ENCOUNTER — Encounter (HOSPITAL_COMMUNITY): Payer: Self-pay | Admitting: Gastroenterology

## 2021-11-07 NOTE — Progress Notes (Unsigned)
Cardiology Office Note:   Date:  11/10/2021  NAME:  Jessica Pacheco    MRN: [id number] DOB:  1976/01/24   PCP:  Asencion Noble, MD  Cardiologist:  None  Electrophysiologist:  None   Referring MD: Asencion Noble, MD   Chief Complaint  Patient presents with   Tachycardia         History of Present Illness:   Jessica Pacheco is a 46 y.o. female with a hx of RA, anxiety, hypertension who is being seen today for the evaluation of atrial tachycardia at the request of Asencion Noble, MD. She presented for an outpatient colonoscopy on 10/26/2021.  EKG demonstrated atrial tachycardia.  She was evaluated in the emergency room.  Work-up was negative.  She was started on a beta-blocker and scheduled for routine follow-up.  She reports she was unaware of her tachycardia.  Heart rate was in the low 100s.  She has no prior history of arrhythmia.  Her medical history is significant for rheumatoid arthritis, osteoarthritis and hypertension.  She has never had a heart attack or stroke but does have a family history of this in her maternal grandparents.  She was unaware for tachycardia.  Had done the colonoscopy prep.  This could have been a trigger.  She does snore and is tired.  Sleep apnea could be a concern as well.  She was started on metoprolol in the emergency room.  She is in normal sinus rhythm today.  No palpitations.  No tachycardia.  She does get short of breath with activity.  She is not active due to her disability.  Thyroid studies were normal.  She is not anemic.  No other alarming medical findings.  Her EKG demonstrates sinus rhythm with no acute ischemic changes.  TSH 3.47  Problem List Rheumatoid arthritis  HTN Atrial tachycardia   Past Medical History: Past Medical History:  Diagnosis Date   Anxiety    Chronic right lower quadrant pain    DDD (degenerative disc disease), cervical    c6-7   Depression    Dyspnea    On exertion at times   GERD (gastroesophageal reflux disease)    WATCHES  DIET   History of hypoglycemia    History of kidney stones    passed spontaneously   Hypertension    Hypothyroidism    OA (osteoarthritis)    knees , hips, feet   PONV (postoperative nausea and vomiting)    Rheumatoid arthritis(714.0) dx  2001  multiple sites   rheumotologist-  dr Herbie [place] gay w/ [facility]--- currently treated w/ oral arava and iv actemra   Shingles    SUI (stress urinary incontinence), female     Past Surgical History: Past Surgical History:  Procedure Laterality Date   COLONOSCOPY WITH PROPOFOL N/A 10/27/2021   Procedure: COLONOSCOPY WITH PROPOFOL;  Surgeon: Harvel Quale, MD;  [facility];  Service: Gastroenterology;  Laterality: N/A;   DILATION AND CURETTAGE OF UTERUS     KNEE ARTHROSCOPY Right 1999   KNEE ARTHROSCOPY WITH MEDIAL MENISECTOMY Right 02/07/2018   Procedure: KNEE ARTHROSCOPY WITH PARTIAL LATERAL MENISECTOMY;  Surgeon: Carole Civil, MD;  Location: [facility];  Service: Orthopedics;  Laterality: Right;   LAPAROSCOPY N/A 02/16/2017   Procedure: LAPAROSCOPY DIAGNOSTIC;  Surgeon: Molli Posey, MD;  Location: [facility];  Service: Gynecology;  Laterality: N/A;   TOTAL HIP ARTHROPLASTY Right 07/30/2020   Procedure: RIGHT TOTAL HIP ARTHROPLASTY ANTERIOR APPROACH;  Surgeon: Mcarthur Rossetti, MD;  Location:  [facility];  Service: Orthopedics;  Laterality: Right;   WISDOM TOOTH EXTRACTION      Current Medications: Current Meds  Medication Sig   amLODipine (NORVASC) 10 MG tablet Take 10 mg by mouth at bedtime.   Ascorbic Acid (VITAMIN C PO) Take 1 tablet by mouth at bedtime.   celecoxib (CELEBREX) 100 MG capsule Take 100 mg by mouth 2 (two) times daily.   cetirizine-pseudoephedrine (ZYRTEC-D) 5-120 MG tablet Take 1 tablet by mouth daily.   escitalopram (LEXAPRO) 20 MG tablet Take 20 mg by mouth in the morning.   gabapentin (NEURONTIN) 300 MG capsule Take 300 mg by mouth 3 (three) times daily.   hydrochlorothiazide  (HYDRODIURIL) 25 MG tablet Take 25 mg by mouth daily after breakfast.   hydroxychloroquine (PLAQUENIL) 200 MG tablet Take 200 mg by mouth 2 (two) times daily.   ibuprofen (ADVIL) 200 MG tablet Take 800 mg by mouth every 8 (eight) hours as needed (pain.).   JUNEL FE 1/20 1-20 MG-MCG tablet Take 1 tablet by mouth every evening. Continuous (only takes active pills)   levothyroxine (SYNTHROID) 100 MCG tablet Take 100 mcg by mouth daily before breakfast.   loratadine (CLARITIN) 10 MG tablet Take 10 mg by mouth daily as needed for allergies.   LORazepam (ATIVAN) 0.5 MG tablet Take 0.5 mg by mouth daily as needed for anxiety.   melatonin 3 MG TABS tablet Take 3 mg by mouth at bedtime as needed (sleep).   methocarbamol (ROBAXIN) 500 MG tablet TAKE 1 TABLET(500 MG) BY MOUTH EVERY 6 HOURS AS NEEDED FOR MUSCLE SPASMS   oxyCODONE (OXY IR/ROXICODONE) 5 MG immediate release tablet Take 1 tablet (5 mg total) by mouth every 6 (six) hours as needed for moderate pain (pain score 4-6).   potassium chloride Jessica (KLOR-CON M) 20 MEQ tablet Take 2 tablets (40 mEq total) by mouth daily for 4 days.   tiZANidine (ZANAFLEX) 4 MG tablet Take 1 tablet (4 mg total) by mouth every 8 (eight) hours as needed for muscle spasms. (Patient taking differently: Take 4 mg by mouth at bedtime as needed for muscle spasms.)   Upadacitinib ER (RINVOQ) 15 MG TB24 Take 15 mg by mouth in the morning.   [DISCONTINUED] metoprolol tartrate (LOPRESSOR) 25 MG tablet Take 1 tablet (25 mg total) by mouth 2 (two) times daily.     Allergies:    Patient has no known allergies.   Social History: Social History   Socioeconomic History   Marital status: Married    Spouse name: Not on file   Number of children: 1   Years of education: Not on file   Highest education level: Not on file  Occupational History   Occupation: Disabled  Tobacco Use   Smoking status: Never   Smokeless tobacco: Never  Vaping Use   Vaping Use: Never used  Substance and  Sexual Activity   Alcohol use: No   Drug use: No   Sexual activity: Yes    Birth control/protection: None  Other Topics Concern   Not on file  Social History Narrative   Not on file   Social Determinants of Health   Financial Resource Strain: Not on file  Food Insecurity: Not on file  Transportation Needs: Not on file  Physical Activity: Not on file  Stress: Not on file  Social Connections: Not on file     Family History: The patient's family history includes Colon cancer in her father and paternal aunt; Heart disease in her maternal grandfather and  maternal grandmother.  ROS:   All other ROS reviewed and negative. Pertinent positives noted in the HPI.     EKGs/Labs/Other Studies Reviewed:   The following studies were personally reviewed by me today:  EKG:  EKG is ordered today.  The ekg ordered today demonstrates normal sinus rhythm heart rate 70, no acute ischemic changes or evidence of infarction, and was personally reviewed by me.   Recent Labs: 10/26/2021: BUN 10; Creatinine, Ser 1.11; Hemoglobin 14.8; Magnesium 1.8; Platelets 173; Potassium 3.7; Sodium 138; TSH 3.473   Recent Lipid Panel No results found for: "CHOL", "TRIG", "HDL", "CHOLHDL", "VLDL", "LDLCALC", "LDLDIRECT"  Physical Exam:   VS:  BP 120/82   Pulse 70   Ht 5\' 6"  (1.676 m)   Wt 241 lb 3.2 oz (109.4 kg)   SpO2 99%   BMI 38.93 kg/m    Wt Readings from Last 3 Encounters:  11/10/21 241 lb 3.2 oz (109.4 kg)  10/27/21 234 lb (106.1 kg)  10/26/21 234 lb (106.1 kg)    General: Well nourished, well developed, in no acute distress Head: Atraumatic, normal size  Eyes: PEERLA, EOMI  Neck: Supple, no JVD Endocrine: No thryomegaly Cardiac: Normal S1, S2; RRR; no murmurs, rubs, or gallops Lungs: Clear to auscultation bilaterally, no wheezing, rhonchi or rales  Abd: Soft, nontender, no hepatomegaly  Ext: No edema, pulses 2+ Musculoskeletal: No deformities, BUE and BLE strength normal and equal Skin: Warm  and dry, no rashes   Neuro: Alert and oriented to person, place, time, and situation, CNII-XII grossly intact, no focal deficits  Psych: Normal mood and affect   ASSESSMENT:   Jessica Pacheco is a 46 y.o. female who presents for the following: 1. Atrial tachycardia ([place])    PLAN:   1. Atrial tachycardia ([place]) -Developed an atrial tachycardia which she was unaware of.  She presented for a colonoscopy.  She had done the prep.  The prep could have triggered this.  She also reports she may have sleep apnea.  This could have triggered this as well.  She was started on metoprolol.  Her thyroid studies were normal.  Her lab work was normal.  Her CV exam is normal.  She does need an echocardiogram.  I would like to proceed with a 7-day monitor to make sure she is not having any further atrial tachycardia episodes.  This is a benign arrhythmia.  We will continue metoprolol for now.  We discussed caffeine reduction.  She drinks lots of soda.  We also discussed exercise and weight loss.  She will work on this.  She needs to drink more water.  She will see me back in 6 months for further assessment.     Disposition: Return in about 6 months (around 05/11/2022).  Medication Adjustments/Labs and Tests Ordered: Current medicines are reviewed at length with the patient today.  Concerns regarding medicines are outlined above.  Orders Placed This Encounter  Procedures   LONG TERM MONITOR (3-14 DAYS)   EKG 12-Lead   ECHOCARDIOGRAM COMPLETE   Meds ordered this encounter  Medications   metoprolol tartrate (LOPRESSOR) 25 MG tablet    Sig: Take 1 tablet (25 mg total) by mouth 2 (two) times daily.    Dispense:  60 tablet    Refill:  3    Patient Instructions  Medication Instructions:  The current medical regimen is effective;  continue present plan and medications.  *If you need a refill on your cardiac medications before your next appointment, please call  your  pharmacy*  Testing/Procedures:  Echocardiogram - Your physician has requested that you have an echocardiogram. Echocardiography is a painless test that uses sound waves to create images of your heart. It provides your doctor with information about the size and shape of your heart and how well your heart's chambers and valves are working. This procedure takes approximately one hour. There are no restrictions for this procedure.   ZIO XT- Long Term Monitor Instructions  Your physician has requested you wear a ZIO patch monitor for 7 days.  This is a single patch monitor. [employer] supplies one patch monitor per enrollment. Additional stickers are not available. Please do not apply patch if you will be having a Nuclear Stress Test,  Echocardiogram, Cardiac CT, MRI, or Chest Xray during the period you would be wearing the  monitor. The patch cannot be worn during these tests. You cannot remove and re-apply the  ZIO XT patch monitor.  Your ZIO patch monitor will be mailed 3 day USPS to your address on file. It may take 3-5 days  to receive your monitor after you have been enrolled.  Once you have received your monitor, please review the enclosed instructions. Your monitor  has already been registered assigning a specific monitor serial # to you.  Billing and Patient Assistance Program Information  We have supplied [employer] with any of your insurance information on file for billing purposes. [employer] offers a sliding scale Patient Assistance Program for patients that do not have  insurance, or whose insurance does not completely cover the cost of the ZIO monitor.  You must apply for the Patient Assistance Program to qualify for this discounted rate.  To apply, please call [employer] at [phone], select option 4, select option 2, ask to apply for  Patient Assistance Program. Theodore Demark will ask your household income, and how many people  are in your household. They will quote your out-of-pocket cost  based on that information.  [employer] will also be able to set up a 42-month, interest-free payment plan if needed.  Applying the monitor   Shave hair from upper left chest.  Hold abrader disc by orange tab. Rub abrader in 40 strokes over the upper left chest as  indicated in your monitor instructions.  Clean area with 4 enclosed alcohol pads. Let dry.  Apply patch as indicated in monitor instructions. Patch will be placed under collarbone on left  side of chest with arrow pointing upward.  Rub patch adhesive wings for 2 minutes. Remove white label marked "1". Remove the white  label marked "2". Rub patch adhesive wings for 2 additional minutes.  While looking in a mirror, press and release button in center of patch. A small green light will  flash 3-4 times. This will be your only indicator that the monitor has been turned on.  Do not shower for the first 24 hours. You may shower after the first 24 hours.  Press the button if you feel a symptom. You will hear a small click. Record Date, Time and  Symptom in the Patient Logbook.  When you are ready to remove the patch, follow instructions on the last 2 pages of Patient  Logbook. Stick patch monitor onto the last page of Patient Logbook.  Place Patient Logbook in the blue and white box. Use locking tab on box and tape box closed  securely. The blue and white box has prepaid postage on it. Please place it in the mailbox as  soon as possible. Your physician  should have your test results approximately 7 days after the  monitor has been mailed back to [facility].  Call [place] at [phone] if you have questions regarding  your ZIO XT patch monitor. Call them immediately if you see an orange light blinking on your  monitor.  If your monitor falls off in less than 4 days, contact our Monitor department at [phone].  If your monitor becomes loose or falls off after 4 days call [employer] at [phone] for   suggestions on securing your monitor    Follow-Up: At [facility], you and your health needs are our priority.  As part of our continuing mission to provide you with exceptional heart care, we have created designated Provider Care Teams.  These Care Teams include your primary Cardiologist (physician) and Advanced Practice Providers (APPs -  Physician Assistants and Nurse Practitioners) who all work together to provide you with the care you need, when you need it.  We recommend signing up for the patient portal called "MyChart".  Sign up information is provided on this After Visit Summary.  MyChart is used to connect with patients for Virtual Visits (Telemedicine).  Patients are able to view lab/test results, encounter notes, upcoming appointments, etc.  Non-urgent messages can be sent to your provider as well.   To learn more about what you can do with MyChart, go to [email or web address].    Your next appointment:   6 month(s)  The format for your next appointment:   In Person  Provider:   Eleonore Chiquito, MD          Signed, Addison Naegeli. Audie Box, MD, [place]  [address] [place], [place] [address] [phone]  11/10/2021 8:46 AM

## 2021-11-10 ENCOUNTER — Ambulatory Visit: Payer: Medicare HMO | Attending: Cardiovascular Disease

## 2021-11-10 ENCOUNTER — Ambulatory Visit: Payer: Medicare HMO | Attending: Cardiovascular Disease | Admitting: Cardiovascular Disease

## 2021-11-10 ENCOUNTER — Encounter: Payer: Self-pay | Admitting: Cardiovascular Disease

## 2021-11-10 VITALS — BP 120/82 | HR 70 | Ht 66.0 in | Wt 241.2 lb

## 2021-11-10 DIAGNOSIS — I4719 Other supraventricular tachycardia: Secondary | ICD-10-CM

## 2021-11-10 DIAGNOSIS — I471 Supraventricular tachycardia: Secondary | ICD-10-CM | POA: Diagnosis not present

## 2021-11-10 MED ORDER — METOPROLOL TARTRATE 25 MG PO TABS: 25.0000 mg | ORAL_TABLET | Freq: Two times a day (BID) | ORAL | 3 refills | Status: DC

## 2021-11-10 NOTE — Patient Instructions (Signed)
Medication Instructions:  The current medical regimen is effective;  continue present plan and medications.  *If you need a refill on your cardiac medications before your next appointment, please call your pharmacy*  Testing/Procedures:  Echocardiogram - Your physician has requested that you have an echocardiogram. Echocardiography is a painless test that uses sound waves to create images of your heart. It provides your doctor with information about the size and shape of your heart and how well your heart's chambers and valves are working. This procedure takes approximately one hour. There are no restrictions for this procedure.   ZIO XT- Long Term Monitor Instructions  Your physician has requested you wear a ZIO patch monitor for 7 days.  This is a single patch monitor. [employer] supplies one patch monitor per enrollment. Additional stickers are not available. Please do not apply patch if you will be having a Nuclear Stress Test,  Echocardiogram, Cardiac CT, MRI, or Chest Xray during the period you would be wearing the  monitor. The patch cannot be worn during these tests. You cannot remove and re-apply the  ZIO XT patch monitor.  Your ZIO patch monitor will be mailed 3 day USPS to your address on file. It may take 3-5 days  to receive your monitor after you have been enrolled.  Once you have received your monitor, please review the enclosed instructions. Your monitor  has already been registered assigning a specific monitor serial # to you.  Billing and Patient Assistance Program Information  We have supplied [employer] with any of your insurance information on file for billing purposes. [employer] offers a sliding scale Patient Assistance Program for patients that do not have  insurance, or whose insurance does not completely cover the cost of the ZIO monitor.  You must apply for the Patient Assistance Program to qualify for this discounted rate.  To apply, please call [employer] at [phone],  select option 4, select option 2, ask to apply for  Patient Assistance Program. Theodore Demark will ask your household income, and how many people  are in your household. They will quote your out-of-pocket cost based on that information.  [employer] will also be able to set up a 71-month, interest-free payment plan if needed.  Applying the monitor   Shave hair from upper left chest.  Hold abrader disc by orange tab. Rub abrader in 40 strokes over the upper left chest as  indicated in your monitor instructions.  Clean area with 4 enclosed alcohol pads. Let dry.  Apply patch as indicated in monitor instructions. Patch will be placed under collarbone on left  side of chest with arrow pointing upward.  Rub patch adhesive wings for 2 minutes. Remove white label marked "1". Remove the white  label marked "2". Rub patch adhesive wings for 2 additional minutes.  While looking in a mirror, press and release button in center of patch. A small green light will  flash 3-4 times. This will be your only indicator that the monitor has been turned on.  Do not shower for the first 24 hours. You may shower after the first 24 hours.  Press the button if you feel a symptom. You will hear a small click. Record Date, Time and  Symptom in the Patient Logbook.  When you are ready to remove the patch, follow instructions on the last 2 pages of Patient  Logbook. Stick patch monitor onto the last page of Patient Logbook.  Place Patient Logbook in the blue and white box. Use locking tab on  box and tape box closed  securely. The blue and white box has prepaid postage on it. Please place it in the mailbox as  soon as possible. Your physician should have your test results approximately 7 days after the  monitor has been mailed back to [facility].  Call [facility] at [phone] if you have questions regarding  your ZIO XT patch monitor. Call them immediately if you see an orange light blinking on your   monitor.  If your monitor falls off in less than 4 days, contact our Monitor department at [phone].  If your monitor becomes loose or falls off after 4 days call [employer] at [phone] for  suggestions on securing your monitor    Follow-Up: At [facility], you and your health needs are our priority.  As part of our continuing mission to provide you with exceptional heart care, we have created designated Provider Care Teams.  These Care Teams include your primary Cardiologist (physician) and Advanced Practice Providers (APPs -  Physician Assistants and Nurse Practitioners) who all work together to provide you with the care you need, when you need it.  We recommend signing up for the patient portal called "MyChart".  Sign up information is provided on this After Visit Summary.  MyChart is used to connect with patients for Virtual Visits (Telemedicine).  Patients are able to view lab/test results, encounter notes, upcoming appointments, etc.  Non-urgent messages can be sent to your provider as well.   To learn more about what you can do with MyChart, go to [email or web address].    Your next appointment:   6 month(s)  The format for your next appointment:   In Person  Provider:   Eleonore Chiquito, MD

## 2021-11-10 NOTE — Progress Notes (Unsigned)
Enrolled for [employer] to mail a ZIO XT long term holter monitor to the patients address on file.  

## 2021-11-11 ENCOUNTER — Ambulatory Visit (HOSPITAL_COMMUNITY)
Admission: RE | Admit: 2021-11-11 | Discharge: 2021-11-11 | Disposition: A | Discharge status: Home / Self Care | Payer: Medicare HMO | Source: Ambulatory Visit | Attending: Cardiovascular Disease | Admitting: Cardiovascular Disease

## 2021-11-11 DIAGNOSIS — I471 Supraventricular tachycardia: Secondary | ICD-10-CM | POA: Insufficient documentation

## 2021-11-11 LAB — ECHOCARDIOGRAM COMPLETE
Area-P 1/2: 3.03 cm2
S' Lateral: 3 cm

## 2021-11-11 NOTE — Progress Notes (Signed)
*  PRELIMINARY RESULTS* Echocardiogram 2D Echocardiogram has been performed.  Samuel Germany 11/11/2021, 12:22 PM

## 2021-11-13 DIAGNOSIS — I471 Supraventricular tachycardia: Secondary | ICD-10-CM

## 2021-11-14 ENCOUNTER — Ambulatory Visit: Payer: Medicare HMO | Admitting: Orthopaedic Surgery

## 2021-11-15 ENCOUNTER — Encounter (HOSPITAL_COMMUNITY): Payer: Self-pay | Admitting: Physical Therapy

## 2021-11-15 ENCOUNTER — Ambulatory Visit (HOSPITAL_COMMUNITY): Payer: Medicare HMO | Attending: Orthopaedic Surgery | Admitting: Physical Therapy

## 2021-11-15 DIAGNOSIS — R2689 Other abnormalities of gait and mobility: Secondary | ICD-10-CM | POA: Diagnosis not present

## 2021-11-15 DIAGNOSIS — M5441 Lumbago with sciatica, right side: Secondary | ICD-10-CM | POA: Diagnosis not present

## 2021-11-15 DIAGNOSIS — M6281 Muscle weakness (generalized): Secondary | ICD-10-CM | POA: Diagnosis not present

## 2021-11-15 DIAGNOSIS — R29898 Other symptoms and signs involving the musculoskeletal system: Secondary | ICD-10-CM | POA: Diagnosis not present

## 2021-11-15 DIAGNOSIS — M5459 Other low back pain: Secondary | ICD-10-CM | POA: Insufficient documentation

## 2021-11-15 DIAGNOSIS — Z96641 Presence of right artificial hip joint: Secondary | ICD-10-CM | POA: Diagnosis not present

## 2021-11-15 NOTE — Therapy (Signed)
OUTPATIENT PHYSICAL THERAPY THORACOLUMBAR EVALUATION   Patient Name: Jessica Pacheco MRN: [id number] DOB:December 04, 1975, 46 y.o., female Today's Date: 11/15/2021   PT End of Session - 11/15/21 0858     Visit Number 1    Number of Visits 12    Date for PT Re-Evaluation 12/27/21    Authorization Type [employer]    Progress Note Due on Visit 10    PT Start Time 0900    PT Stop Time 0940    PT Time Calculation (min) 40 min    Activity Tolerance Patient tolerated treatment well    Behavior During Therapy WFL for tasks assessed/performed             Past Medical History:  Diagnosis Date   Anxiety    Chronic right lower quadrant pain    DDD (degenerative disc disease), cervical    c6-7   Depression    Dyspnea    On exertion at times   GERD (gastroesophageal reflux disease)    WATCHES DIET   History of hypoglycemia    History of kidney stones    passed spontaneously   Hypertension    Hypothyroidism    OA (osteoarthritis)    knees , hips, feet   PONV (postoperative nausea and vomiting)    Rheumatoid arthritis(714.0) dx  2001  multiple sites   rheumotologist-  dr Herbie [place] gay w/ [facility]--- currently treated w/ oral arava and iv actemra   Shingles    SUI (stress urinary incontinence), female    Past Surgical History:  Procedure Laterality Date   COLONOSCOPY WITH PROPOFOL N/A 10/27/2021   Procedure: COLONOSCOPY WITH PROPOFOL;  Surgeon: Harvel Quale, MD;  Location: [facility];  Service: Gastroenterology;  Laterality: N/A;   DILATION AND CURETTAGE OF UTERUS     KNEE ARTHROSCOPY Right 1999   KNEE ARTHROSCOPY WITH MEDIAL MENISECTOMY Right 02/07/2018   Procedure: KNEE ARTHROSCOPY WITH PARTIAL LATERAL MENISECTOMY;  Surgeon: Carole Civil, MD;  Location: [facility];  Service: Orthopedics;  Laterality: Right;   LAPAROSCOPY N/A 02/16/2017   Procedure: LAPAROSCOPY DIAGNOSTIC;  Surgeon: Molli Posey, MD;  Location: [facility];  Service: Gynecology;   Laterality: N/A;   TOTAL HIP ARTHROPLASTY Right 07/30/2020   Procedure: RIGHT TOTAL HIP ARTHROPLASTY ANTERIOR APPROACH;  Surgeon: Mcarthur Rossetti, MD;  Location: [facility];  Service: Orthopedics;  Laterality: Right;   WISDOM TOOTH EXTRACTION     Patient Active Problem List   Diagnosis Date Noted   Status post total replacement of right hip 07/30/2020   Hypertensive disorder 05/14/2019   Hypothyroidism 05/14/2019   Migraine 05/14/2019   Ovarian endometriosis 05/14/2019   S/P right knee arthroscopy 02/07/18 02/07/2018   Meniscus, lateral, derangement, right    Synovitis of right knee    Arthritis of right knee    Osteoarthritis, multiple sites 01/14/2018   Rheumatoid arthritis of multiple sites with negative rheumatoid factor ([place]) 05/08/2016   Primary osteoarthritis of both knees 08/30/2015   Primary osteoarthritis of right hip 08/30/2015   Primary osteoarthritis of left foot 06/30/2014   Change in stool 07/27/2011   Contraception 01/02/2011   Rheumatoid arthritis(714.0) 02/21/1999    PCP: Asencion Noble MD  REFERRING PROVIDER: Mcarthur Rossetti, MD   REFERRING DIAG: [phone] (ICD-10-CM) - History of right hip replacement M54.41 (ICD-10-CM) - Acute right-sided low back pain with right-sided sciatica   Rationale for Evaluation and Treatment Rehabilitation  THERAPY DIAG:  Other low back pain  Muscle weakness (generalized)  Other abnormalities  of gait and mobility  Other symptoms and signs involving the musculoskeletal system  ONSET DATE: July 2023  SUBJECTIVE:                                                                                                                                                                                           SUBJECTIVE STATEMENT: Patient states hip replacement about a year ago. She has a tightness/discomfort in R low back, right hip, and it wraps to groin region. She was swimming and went to chiro and had some dry needling. She had  a prednisone pack which helped a lot. She was having a lot of pain with all movement. She is having some of the hip/groin pain intermittently but has not been swimming or doing as much. Back is still bothering her. Sometimes increased symptoms with walking.   PERTINENT HISTORY:  RA, OA, HTN, obesity, sedentary lifestyle   PAIN:  Are you having pain? Yes: NPRS scale: 3/10 Pain location: low back Pain description: tightness/discomfort Aggravating factors: standing Relieving factors: rest   PRECAUTIONS: None  WEIGHT BEARING RESTRICTIONS No  FALLS:  Has patient fallen in last 6 months? No  LIVING ENVIRONMENT: Lives with: lives with their spouse Lives in: House/apartment Stairs: Yes: External: 3 steps; none Has following equipment at home: Single point cane, Walker - 2 wheeled, and bed side commode  OCCUPATION: disability  PLOF: Independent  PATIENT GOALS pain reduction   OBJECTIVE:   DIAGNOSTIC FINDINGS:  XR 10/12/21 An [facility] pelvis and lateral of the right hip shows a well-seated total hip arthroplasty with no complicating features.  PATIENT SURVEYS:  FOTO 47% function  SCREENING FOR RED FLAGS: Bowel or bladder incontinence: No Spinal tumors: No Cauda equina syndrome: No Compression fracture: No Abdominal aneurysm: No  COGNITION:  Overall cognitive status: Within functional limits for tasks assessed     SENSATION: WFL   POSTURE: rounded shoulders and forward head  PALPATION: TTP lower R lumbar paraspinals, glutes  LUMBAR ROM:   Active  A/PROM  eval  Flexion 25% limited *  Extension 50% limited*  Right lateral flexion 0% limited *  Left lateral flexion 0% limited  Right rotation 25% limited *  Left rotation 25% limited    (Blank rows = not tested) *= pain  LOWER EXTREMITY ROM:   WFL for tasks assessed  Active  Right eval Left eval  Hip flexion    Hip extension    Hip abduction    Hip adduction    Hip internal rotation    Hip external rotation     Knee flexion    Knee extension  Ankle dorsiflexion    Ankle plantarflexion    Ankle inversion    Ankle eversion     (Blank rows = not tested)  LOWER EXTREMITY MMT:    MMT Right eval Left eval  Hip flexion 4+* 5  Hip extension 4* 4  Hip abduction 4* 4+  Hip adduction    Hip internal rotation    Hip external rotation    Knee flexion 5 5  Knee extension 5 5  Ankle dorsiflexion 5 5  Ankle plantarflexion    Ankle inversion    Ankle eversion     (Blank rows = not tested)   FUNCTIONAL TESTS:  5 times sit to stand: 20.31 seconds, hands on thighs, slight reliance on LLE>RLE 2 minute walk test: 310 feet Transfers: labored, pain with transfer  GAIT: Distance walked: 310 feet Assistive device utilized: None Level of assistance: Complete Independence Comments: 2MWT, gradual increase in symptoms beginning at 1 minute with gradual worsening Mild antalgic with gradual worsening, mild Trendelenburg with worsening after 1 minute    TODAY'S TREATMENT  11/15/21 Bridge 2x 10 Ab set 10x 5 second holds   PATIENT EDUCATION:  Education details: Patient educated on exam findings, POC, scope of PT, HEP, and bed mobility. Person educated: Patient Education method: Explanation, Demonstration, and Handouts Education comprehension: verbalized understanding, returned demonstration, verbal cues required, and tactile cues required  HOME EXERCISE PROGRAM: Access Code: [email or web address]  Date: 11/15/2021 - Supine Bridge  - 2-3 x daily - 7 x weekly - 2 sets - 10 reps - Abdominal Bracing  - 2-3 x daily - 7 x weekly - 2 sets - 10 reps - 5 second hold  ASSESSMENT:  CLINICAL IMPRESSION: Patient a 46 y.o. y.o. female who was seen today for physical therapy evaluation and treatment for LBP. Patient presents with pain limited deficits in lumbar and hip strength, ROM, endurance, activity tolerance, and functional mobility with ADL. Patient is having to modify and restrict ADL as indicated by outcome  measure score as well as subjective information and objective measures which is affecting overall participation. Patient will benefit from skilled physical therapy in order to improve function and reduce impairment.    OBJECTIVE IMPAIRMENTS Abnormal gait, decreased activity tolerance, decreased balance, decreased endurance, decreased mobility, difficulty walking, decreased ROM, decreased strength, hypomobility, increased muscle spasms, impaired flexibility, improper body mechanics, postural dysfunction, and pain.   ACTIVITY LIMITATIONS carrying, lifting, bending, standing, squatting, stairs, transfers, locomotion level, and caring for others  PARTICIPATION LIMITATIONS: meal prep, cleaning, laundry, shopping, community activity, and yard work  [place], Past/current experiences, Time since onset of injury/illness/exacerbation, and 3+ comorbidities: RA, OA, HTN, obesity  are also affecting patient's functional outcome.   REHAB POTENTIAL: Good  CLINICAL DECISION MAKING: Stable/uncomplicated  EVALUATION COMPLEXITY: Low   GOALS: Goals reviewed with patient? Yes  SHORT TERM GOALS: Target date: 12/06/2021  Patient will be independent with HEP in order to improve functional outcomes. Baseline:  Goal status: INITIAL  2.  Patient will report at least 25% improvement in symptoms for improved quality of life. Baseline:  Goal status: INITIAL    LONG TERM GOALS: Target date: 12/27/2021  Patient will report at least 75% improvement in symptoms for improved quality of life. Baseline:  Goal status: INITIAL  2.  Patient will improve FOTO score by at least 17 points in order to indicate improved tolerance to activity. Baseline: 47% function Goal status: INITIAL  3.  Patient will demonstrate at least 25% improvement in lumbar ROM in  all restricted planes for improved ability to move trunk while completing chores. Baseline: see AROM Goal status: INITIAL  4.  Patient will be  able to ambulate at least 400 feet in 2MWT in order to demonstrate improved tolerance to activity. Baseline: 310 feet Goal status: INITIAL  5.  Patient will demonstrate grade of 5/5 MMT grade in all tested musculature as evidence of improved strength to assist with stair ambulation and gait.   Baseline: see MMT Goal status: INITIAL  6.  Patient will be able to complete 5x STS in under 11.4 seconds in order to reduce the risk of falls. Baseline: 20.31 seconds Goal status: INITIAL    PLAN: PT FREQUENCY: 2x/week  PT DURATION: 6 weeks  PLANNED INTERVENTIONS: Therapeutic exercises, Therapeutic activity, Neuromuscular re-education, Balance training, Gait training, Patient/Family education, Joint manipulation, Joint mobilization, Stair training, Orthotic/Fit training, DME instructions, Aquatic Therapy, Dry Needling, Electrical stimulation, Spinal manipulation, Spinal mobilization, Cryotherapy, Moist heat, Compression bandaging, scar mobilization, Splintting, Taping, Traction, Ultrasound, Ionotophoresis 4mg /ml Dexamethasone, and Manual therapy  PLAN FOR NEXT SESSION: glute and core strength, f/u with HEP and progress as able, functional strength    Mearl Latin, PT 11/15/2021, 9:45 AM

## 2021-11-16 ENCOUNTER — Ambulatory Visit (HOSPITAL_COMMUNITY): Payer: Medicare HMO | Admitting: Physical Therapy

## 2021-11-16 ENCOUNTER — Encounter (HOSPITAL_COMMUNITY): Payer: Self-pay | Admitting: Physical Therapy

## 2021-11-16 DIAGNOSIS — R29898 Other symptoms and signs involving the musculoskeletal system: Secondary | ICD-10-CM | POA: Diagnosis not present

## 2021-11-16 DIAGNOSIS — R2689 Other abnormalities of gait and mobility: Secondary | ICD-10-CM

## 2021-11-16 DIAGNOSIS — M6281 Muscle weakness (generalized): Secondary | ICD-10-CM

## 2021-11-16 DIAGNOSIS — M5441 Lumbago with sciatica, right side: Secondary | ICD-10-CM | POA: Diagnosis not present

## 2021-11-16 DIAGNOSIS — M5459 Other low back pain: Secondary | ICD-10-CM | POA: Diagnosis not present

## 2021-11-16 DIAGNOSIS — Z96641 Presence of right artificial hip joint: Secondary | ICD-10-CM | POA: Diagnosis not present

## 2021-11-16 NOTE — Therapy (Signed)
OUTPATIENT PHYSICAL THERAPY TREATMENT   Patient Name: Jessica Pacheco MRN: [id number] DOB:1975/10/15, 46 y.o., female Today's Date: 11/16/2021   PT End of Session - 11/16/21 0814     Visit Number 2    Number of Visits 12    Date for PT Re-Evaluation 12/27/21    Authorization Type [employer]    Progress Note Due on Visit 10    PT Start Time 0815    PT Stop Time 0853    PT Time Calculation (min) 38 min    Activity Tolerance Patient tolerated treatment well    Behavior During Therapy WFL for tasks assessed/performed             Past Medical History:  Diagnosis Date   Anxiety    Chronic right lower quadrant pain    DDD (degenerative disc disease), cervical    c6-7   Depression    Dyspnea    On exertion at times   GERD (gastroesophageal reflux disease)    WATCHES DIET   History of hypoglycemia    History of kidney stones    passed spontaneously   Hypertension    Hypothyroidism    OA (osteoarthritis)    knees , hips, feet   PONV (postoperative nausea and vomiting)    Rheumatoid arthritis(714.0) dx  2001  multiple sites   rheumotologist-  dr Herbie [place] gay w/ [facility]--- currently treated w/ oral arava and iv actemra   Shingles    SUI (stress urinary incontinence), female    Past Surgical History:  Procedure Laterality Date   COLONOSCOPY WITH PROPOFOL N/A 10/27/2021   Procedure: COLONOSCOPY WITH PROPOFOL;  Surgeon: Harvel Quale, MD;  Location: [facility];  Service: Gastroenterology;  Laterality: N/A;   DILATION AND CURETTAGE OF UTERUS     KNEE ARTHROSCOPY Right 1999   KNEE ARTHROSCOPY WITH MEDIAL MENISECTOMY Right 02/07/2018   Procedure: KNEE ARTHROSCOPY WITH PARTIAL LATERAL MENISECTOMY;  Surgeon: Carole Civil, MD;  Location: [facility];  Service: Orthopedics;  Laterality: Right;   LAPAROSCOPY N/A 02/16/2017   Procedure: LAPAROSCOPY DIAGNOSTIC;  Surgeon: Molli Posey, MD;  Location: [facility];  Service: Gynecology;  Laterality:  N/A;   TOTAL HIP ARTHROPLASTY Right 07/30/2020   Procedure: RIGHT TOTAL HIP ARTHROPLASTY ANTERIOR APPROACH;  Surgeon: Mcarthur Rossetti, MD;  Location: [facility];  Service: Orthopedics;  Laterality: Right;   WISDOM TOOTH EXTRACTION     Patient Active Problem List   Diagnosis Date Noted   Status post total replacement of right hip 07/30/2020   Hypertensive disorder 05/14/2019   Hypothyroidism 05/14/2019   Migraine 05/14/2019   Ovarian endometriosis 05/14/2019   S/P right knee arthroscopy 02/07/18 02/07/2018   Meniscus, lateral, derangement, right    Synovitis of right knee    Arthritis of right knee    Osteoarthritis, multiple sites 01/14/2018   Rheumatoid arthritis of multiple sites with negative rheumatoid factor ([place]) 05/08/2016   Primary osteoarthritis of both knees 08/30/2015   Primary osteoarthritis of right hip 08/30/2015   Primary osteoarthritis of left foot 06/30/2014   Change in stool 07/27/2011   Contraception 01/02/2011   Rheumatoid arthritis(714.0) 02/21/1999    PCP: Asencion Noble MD  REFERRING PROVIDER: Mcarthur Rossetti, MD   REFERRING DIAG: [phone] (ICD-10-CM) - History of right hip replacement M54.41 (ICD-10-CM) - Acute right-sided low back pain with right-sided sciatica   Rationale for Evaluation and Treatment Rehabilitation  THERAPY DIAG:  Other low back pain  Muscle weakness (generalized)  Other abnormalities of  gait and mobility  Other symptoms and signs involving the musculoskeletal system  ONSET DATE: July 2023  SUBJECTIVE:                                                                                                                                                                                           SUBJECTIVE STATEMENT: Patient states a little stiff/sore this morning. Did HEP last night.   PERTINENT HISTORY:  RA, OA, HTN, obesity, sedentary lifestyle   PAIN:  Are you having pain? Yes: NPRS scale: 3/10 Pain location: low  back Pain description: tightness/discomfort Aggravating factors: standing Relieving factors: rest   PRECAUTIONS: None  WEIGHT BEARING RESTRICTIONS No  FALLS:  Has patient fallen in last 6 months? No  LIVING ENVIRONMENT: Lives with: lives with their spouse Lives in: House/apartment Stairs: Yes: External: 3 steps; none Has following equipment at home: Single point cane, Walker - 2 wheeled, and bed side commode  OCCUPATION: disability  PLOF: Independent  PATIENT GOALS pain reduction   OBJECTIVE:   DIAGNOSTIC FINDINGS:  XR 10/12/21 An AP pelvis and lateral of the right hip shows a well-seated total hip arthroplasty with no complicating features.  PATIENT SURVEYS:  FOTO 47% function  SCREENING FOR RED FLAGS: Bowel or bladder incontinence: No Spinal tumors: No Cauda equina syndrome: No Compression fracture: No Abdominal aneurysm: No  COGNITION:  Overall cognitive status: Within functional limits for tasks assessed     SENSATION: WFL   POSTURE: rounded shoulders and forward head  PALPATION: TTP lower R lumbar paraspinals, glutes  LUMBAR ROM:   Active  A/PROM  eval  Flexion 25% limited *  Extension 50% limited*  Right lateral flexion 0% limited *  Left lateral flexion 0% limited  Right rotation 25% limited *  Left rotation 25% limited    (Blank rows = not tested) *= pain  LOWER EXTREMITY ROM:   WFL for tasks assessed  Active  Right eval Left eval  Hip flexion    Hip extension    Hip abduction    Hip adduction    Hip internal rotation    Hip external rotation    Knee flexion    Knee extension    Ankle dorsiflexion    Ankle plantarflexion    Ankle inversion    Ankle eversion     (Blank rows = not tested)  LOWER EXTREMITY MMT:    MMT Right eval Left eval  Hip flexion 4+* 5  Hip extension 4* 4  Hip abduction 4* 4+  Hip adduction    Hip internal rotation    Hip external rotation    Knee flexion 5  5  Knee extension 5 5  Ankle  dorsiflexion 5 5  Ankle plantarflexion    Ankle inversion    Ankle eversion     (Blank rows = not tested)   FUNCTIONAL TESTS:  5 times sit to stand: 20.31 seconds, hands on thighs, slight reliance on LLE>RLE 2 minute walk test: 310 feet Transfers: labored, pain with transfer  GAIT: Distance walked: 310 feet Assistive device utilized: None Level of assistance: Complete Independence Comments: 2MWT, gradual increase in symptoms beginning at 1 minute with gradual worsening Mild antalgic with gradual worsening, mild Trendelenburg with worsening after 1 minute    TODAY'S TREATMENT  11/16/21 Bridge 2x 10 Ab set 10x 5 second holds March with ab set 3x 8 alternating POE 1 minute Press up 1x 10  Prone hip extension 2 x 10 bilateral  LTR 10 x 5 second holds bilateral  Sidelying hip abduction 2x 10 bilateral  STS 1x 10   11/15/21 Bridge 2x 10 Ab set 10x 5 second holds   PATIENT EDUCATION:  Education details: 11/16/21: HEP;  Eval: Patient educated on exam findings, POC, scope of PT, HEP, and bed mobility. Person educated: Patient Education method: Explanation, Demonstration, and Handouts Education comprehension: verbalized understanding, returned demonstration, verbal cues required, and tactile cues required  HOME EXERCISE PROGRAM: Access Code: [email or web address] 11/16/21 - Supine March with Posterior Pelvic Tilt  - 1 x daily - 7 x weekly - 2 sets - 10 reps - Prone Press Up  - 2-3 x daily - 7 x weekly - 2 sets - 10 reps - Prone Hip Extension  - 1 x daily - 7 x weekly - 2 sets - 10 reps - Supine Lower Trunk Rotation  - 1 x daily - 7 x weekly - 10 reps - 5 second hold - Sidelying Hip Abduction  - 1 x daily - 7 x weekly - 2 sets - 10 reps - Sit to Stand  - 1 x daily - 7 x weekly - 2 sets - 10 reps  Date: 11/15/2021 - Supine Bridge  - 2-3 x daily - 7 x weekly - 2 sets - 10 reps - Abdominal Bracing  - 2-3 x daily - 7 x weekly - 2 sets - 10 reps - 5 second hold  ASSESSMENT:  CLINICAL  IMPRESSION: Began session with previously completed exercises with minimal cueing for mechanics needed. Increased difficulty with marches with ab sets and fatigue noted but able to maintain ab set while completing. Patient stating press ups felt good in lumbar spine. Good mechanics with glute strengthening following initial demonstration. Educated on importance of glute strength for lumbar/knee pathology and mechanics. Patient will continue to benefit from physical therapy in order to improve function and reduce impairment.    OBJECTIVE IMPAIRMENTS Abnormal gait, decreased activity tolerance, decreased balance, decreased endurance, decreased mobility, difficulty walking, decreased ROM, decreased strength, hypomobility, increased muscle spasms, impaired flexibility, improper body mechanics, postural dysfunction, and pain.   ACTIVITY LIMITATIONS carrying, lifting, bending, standing, squatting, stairs, transfers, locomotion level, and caring for others  PARTICIPATION LIMITATIONS: meal prep, cleaning, laundry, shopping, community activity, and yard work  PERSONAL FACTORS Fitness, Past/current experiences, Time since onset of injury/illness/exacerbation, and 3+ comorbidities: RA, OA, HTN, obesity  are also affecting patient's functional outcome.   REHAB POTENTIAL: Good  CLINICAL DECISION MAKING: Stable/uncomplicated  EVALUATION COMPLEXITY: Low   GOALS: Goals reviewed with patient? Yes  SHORT TERM GOALS: Target date: 12/06/2021  Patient will be independent with HEP in order to improve functional  outcomes. Baseline:  Goal status: INITIAL  2.  Patient will report at least 25% improvement in symptoms for improved quality of life. Baseline:  Goal status: INITIAL    LONG TERM GOALS: Target date: 12/27/2021  Patient will report at least 75% improvement in symptoms for improved quality of life. Baseline:  Goal status: INITIAL  2.  Patient will improve FOTO score by at least 17 points in  order to indicate improved tolerance to activity. Baseline: 47% function Goal status: INITIAL  3.  Patient will demonstrate at least 25% improvement in lumbar ROM in all restricted planes for improved ability to move trunk while completing chores. Baseline: see AROM Goal status: INITIAL  4.  Patient will be able to ambulate at least 400 feet in 2MWT in order to demonstrate improved tolerance to activity. Baseline: 310 feet Goal status: INITIAL  5.  Patient will demonstrate grade of 5/5 MMT grade in all tested musculature as evidence of improved strength to assist with stair ambulation and gait.   Baseline: see MMT Goal status: INITIAL  6.  Patient will be able to complete 5x STS in under 11.4 seconds in order to reduce the risk of falls. Baseline: 20.31 seconds Goal status: INITIAL    PLAN: PT FREQUENCY: 2x/week  PT DURATION: 6 weeks  PLANNED INTERVENTIONS: Therapeutic exercises, Therapeutic activity, Neuromuscular re-education, Balance training, Gait training, Patient/Family education, Joint manipulation, Joint mobilization, Stair training, Orthotic/Fit training, DME instructions, Aquatic Therapy, Dry Needling, Electrical stimulation, Spinal manipulation, Spinal mobilization, Cryotherapy, Moist heat, Compression bandaging, scar mobilization, Splintting, Taping, Traction, Ultrasound, Ionotophoresis 4mg /ml Dexamethasone, and Manual therapy  PLAN FOR NEXT SESSION: glute and core strength, f/u with HEP and progress as able, functional strength    Mearl Latin, PT 11/16/2021, 8:14 AM

## 2021-11-24 DIAGNOSIS — Z23 Encounter for immunization: Secondary | ICD-10-CM | POA: Diagnosis not present

## 2021-11-24 DIAGNOSIS — B0233 Zoster keratitis: Secondary | ICD-10-CM | POA: Diagnosis not present

## 2021-11-24 DIAGNOSIS — I10 Essential (primary) hypertension: Secondary | ICD-10-CM | POA: Diagnosis not present

## 2021-11-24 DIAGNOSIS — I471 Supraventricular tachycardia, unspecified: Secondary | ICD-10-CM | POA: Diagnosis not present

## 2021-11-25 DIAGNOSIS — I471 Supraventricular tachycardia, unspecified: Secondary | ICD-10-CM | POA: Diagnosis not present

## 2021-11-29 ENCOUNTER — Ambulatory Visit (HOSPITAL_COMMUNITY): Payer: Medicare HMO | Attending: Orthopaedic Surgery | Admitting: Physical Therapy

## 2021-11-29 ENCOUNTER — Encounter: Payer: Self-pay | Admitting: Cardiovascular Disease

## 2021-11-29 ENCOUNTER — Encounter (HOSPITAL_COMMUNITY): Payer: Self-pay | Admitting: Physical Therapy

## 2021-11-29 DIAGNOSIS — M6281 Muscle weakness (generalized): Secondary | ICD-10-CM | POA: Insufficient documentation

## 2021-11-29 DIAGNOSIS — M5459 Other low back pain: Secondary | ICD-10-CM | POA: Insufficient documentation

## 2021-11-29 DIAGNOSIS — R2689 Other abnormalities of gait and mobility: Secondary | ICD-10-CM | POA: Diagnosis not present

## 2021-11-29 DIAGNOSIS — R29898 Other symptoms and signs involving the musculoskeletal system: Secondary | ICD-10-CM | POA: Insufficient documentation

## 2021-11-29 NOTE — Therapy (Signed)
OUTPATIENT PHYSICAL THERAPY TREATMENT   Patient Name: Jessica Pacheco MRN: [id number] DOB:[birth year], 46 y.o., female Today's Date: 11/29/2021   PT End of Session - 11/29/21 0733     Visit Number 3    Number of Visits 12    Date for PT Re-Evaluation 12/27/21    Authorization Type [employer] Medicare    Progress Note Due on Visit 10    PT Start Time 0732    PT Stop Time 0810    PT Time Calculation (min) 38 min    Activity Tolerance Patient tolerated treatment well    Behavior During Therapy WFL for tasks assessed/performed             Past Medical History:  Diagnosis Date   Anxiety    Chronic right lower quadrant pain    DDD (degenerative disc disease), cervical    c6-7   Depression    Dyspnea    On exertion at times   GERD (gastroesophageal reflux disease)    WATCHES DIET   History of hypoglycemia    History of kidney stones    passed spontaneously   Hypertension    Hypothyroidism    OA (osteoarthritis)    knees , hips, feet   PONV (postoperative nausea and vomiting)    Rheumatoid arthritis(714.0) dx  2001  multiple sites   rheumotologist-  dr Herbie [place] gay w/ [facility]--- currently treated w/ oral arava and iv actemra   Shingles    SUI (stress urinary incontinence), female    Past Surgical History:  Procedure Laterality Date   COLONOSCOPY WITH PROPOFOL N/A 10/27/2021   Procedure: COLONOSCOPY WITH PROPOFOL;  Surgeon: Harvel Quale, MD;  [facility];  Service: Gastroenterology;  Laterality: N/A;   DILATION AND CURETTAGE OF UTERUS     KNEE ARTHROSCOPY Right 1999   KNEE ARTHROSCOPY WITH MEDIAL MENISECTOMY Right 02/07/2018   Procedure: KNEE ARTHROSCOPY WITH PARTIAL LATERAL MENISECTOMY;  Surgeon: Carole Civil, MD;  Location: [facility];  Service: Orthopedics;  Laterality: Right;   LAPAROSCOPY N/A 02/16/2017   Procedure: LAPAROSCOPY DIAGNOSTIC;  Surgeon: Molli Posey, MD;  Location: [facility];  Service: Gynecology;  Laterality:  N/A;   TOTAL HIP ARTHROPLASTY Right 07/30/2020   Procedure: RIGHT TOTAL HIP ARTHROPLASTY ANTERIOR APPROACH;  Surgeon: Mcarthur Rossetti, MD;  Location: [facility];  Service: Orthopedics;  Laterality: Right;   WISDOM TOOTH EXTRACTION     Patient Active Problem List   Diagnosis Date Noted   Status post total replacement of right hip 07/30/2020   Hypertensive disorder 05/14/2019   Hypothyroidism 05/14/2019   Migraine 05/14/2019   Ovarian endometriosis 05/14/2019   S/P right knee arthroscopy 02/07/18 02/07/2018   Meniscus, lateral, derangement, right    Synovitis of right knee    Arthritis of right knee    Osteoarthritis, multiple sites 01/14/2018   Rheumatoid arthritis of multiple sites with negative rheumatoid factor ([place]) 05/08/2016   Primary osteoarthritis of both knees 08/30/2015   Primary osteoarthritis of right hip 08/30/2015   Primary osteoarthritis of left foot 06/30/2014   Change in stool 07/27/2011   Contraception 01/02/2011   Rheumatoid arthritis(714.0) 02/21/1999    PCP: Asencion Noble MD  REFERRING PROVIDER: Mcarthur Rossetti, MD   REFERRING DIAG: [phone] (ICD-10-CM) - History of right hip replacement M54.41 (ICD-10-CM) - Acute right-sided low back pain with right-sided sciatica   Rationale for Evaluation and Treatment Rehabilitation  THERAPY DIAG:  Other low back pain  Muscle weakness (generalized)  Other abnormalities of  gait and mobility  Other symptoms and signs involving the musculoskeletal system  ONSET DATE: July 2023  SUBJECTIVE:                                                                                                                                                                                           SUBJECTIVE STATEMENT: Patient states sore but she has been doing a lot more walking. The longer she walks she notes more soreness and pain in the R hip and through the groin. Exercises have been going alright.   PERTINENT HISTORY:  RA,  OA, HTN, obesity, sedentary lifestyle   PAIN:  Are you having pain? Yes: NPRS scale: 2/10 Pain location: low back Pain description: tightness/discomfort Aggravating factors: standing Relieving factors: rest   PRECAUTIONS: None  WEIGHT BEARING RESTRICTIONS No  FALLS:  Has patient fallen in last 6 months? No  LIVING ENVIRONMENT: Lives with: lives with their spouse Lives in: House/apartment Stairs: Yes: External: 3 steps; none Has following equipment at home: Single point cane, Walker - 2 wheeled, and bed side commode  OCCUPATION: disability  PLOF: Independent  PATIENT GOALS pain reduction   OBJECTIVE:   DIAGNOSTIC FINDINGS:  XR 10/12/21 An AP pelvis and lateral of the right hip shows a well-seated total hip arthroplasty with no complicating features.  PATIENT SURVEYS:  FOTO 47% function  SCREENING FOR RED FLAGS: Bowel or bladder incontinence: No Spinal tumors: No Cauda equina syndrome: No Compression fracture: No Abdominal aneurysm: No  COGNITION:  Overall cognitive status: Within functional limits for tasks assessed     SENSATION: WFL   POSTURE: rounded shoulders and forward head  PALPATION: TTP lower R lumbar paraspinals, glutes  LUMBAR ROM:   Active  A/PROM  eval  Flexion 25% limited *  Extension 50% limited*  Right lateral flexion 0% limited *  Left lateral flexion 0% limited  Right rotation 25% limited *  Left rotation 25% limited    (Blank rows = not tested) *= pain  LOWER EXTREMITY ROM:   WFL for tasks assessed  Active  Right eval Left eval  Hip flexion    Hip extension    Hip abduction    Hip adduction    Hip internal rotation    Hip external rotation    Knee flexion    Knee extension    Ankle dorsiflexion    Ankle plantarflexion    Ankle inversion    Ankle eversion     (Blank rows = not tested)  LOWER EXTREMITY MMT:    MMT Right eval Left eval  Hip flexion 4+* 5  Hip extension 4* 4  Hip  abduction 4* 4+  Hip  adduction    Hip internal rotation    Hip external rotation    Knee flexion 5 5  Knee extension 5 5  Ankle dorsiflexion 5 5  Ankle plantarflexion    Ankle inversion    Ankle eversion     (Blank rows = not tested)   FUNCTIONAL TESTS:  5 times sit to stand: 20.31 seconds, hands on thighs, slight reliance on LLE>RLE 2 minute walk test: 310 feet Transfers: labored, pain with transfer  GAIT: Distance walked: 310 feet Assistive device utilized: None Level of assistance: Complete Independence Comments: 2MWT, gradual increase in symptoms beginning at 1 minute with gradual worsening Mild antalgic with gradual worsening, mild Trendelenburg with worsening after 1 minute    TODAY'S TREATMENT  11/29/21 Press up 1x 10  Standing lumbar extension/hip dip at counter 1 x 10  March with ab set 3x 8 alternating Bridge with clamshell GTB 2x 10  DKTC with heels on green ball 2x 10 5 second holds SLR 2 x 10 bilateral  Lateral stepping GTB at knees 4x 10 feet bilateral  STS 1x 10  Palof 2x 10 bilateral GTB  11/16/21 Bridge 2x 10 Ab set 10x 5 second holds March with ab set 3x 8 alternating POE 1 minute Press up 1x 10  Prone hip extension 2 x 10 bilateral  LTR 10 x 5 second holds bilateral  Sidelying hip abduction 2x 10 bilateral  STS 1x 10   11/15/21 Bridge 2x 10 Ab set 10x 5 second holds   PATIENT EDUCATION:  Education details: 11/16/21: HEP;  Eval: Patient educated on exam findings, POC, scope of PT, HEP, and bed mobility. Person educated: Patient Education method: Explanation, Demonstration, and Handouts Education comprehension: verbalized understanding, returned demonstration, verbal cues required, and tactile cues required  HOME EXERCISE PROGRAM: Access Code: [email or web address] 11/29/21 - Bridge with Hip Abduction and Resistance - Ground Touches  - 1 x daily - 7 x weekly - 2 sets - 10 reps - Supine Hamstring Swiss Ball Curls/Dynamic Mobilization  - 1 x daily - 7 x weekly - 2 sets - 10  reps - 5 second hold - Active Straight Leg Raise with Quad Set  - 1 x daily - 7 x weekly - 2 sets - 10 reps - Side Stepping with Resistance at Thighs  - 1 x daily - 7 x weekly - 4 sets - 10 reps  11/16/21 - Supine March with Posterior Pelvic Tilt  - 1 x daily - 7 x weekly - 2 sets - 10 reps - Prone Press Up  - 2-3 x daily - 7 x weekly - 2 sets - 10 reps - Prone Hip Extension  - 1 x daily - 7 x weekly - 2 sets - 10 reps - Supine Lower Trunk Rotation  - 1 x daily - 7 x weekly - 10 reps - 5 second hold - Sidelying Hip Abduction  - 1 x daily - 7 x weekly - 2 sets - 10 reps - Sit to Stand  - 1 x daily - 7 x weekly - 2 sets - 10 reps  Date: 11/15/2021 - Supine Bridge  - 2-3 x daily - 7 x weekly - 2 sets - 10 reps - Abdominal Bracing  - 2-3 x daily - 7 x weekly - 2 sets - 10 reps - 5 second hold  ASSESSMENT:  CLINICAL IMPRESSION: Began session with press ups which improve stiffness, symptoms, and mobility. Continued with core and hip  strengthening which is tolerated well. Intermittent c/o R knee pain with weightbearing exercises but able to complete with good mechanics. Patient will continue to benefit from physical therapy in order to improve function and reduce impairment.    OBJECTIVE IMPAIRMENTS Abnormal gait, decreased activity tolerance, decreased balance, decreased endurance, decreased mobility, difficulty walking, decreased ROM, decreased strength, hypomobility, increased muscle spasms, impaired flexibility, improper body mechanics, postural dysfunction, and pain.   ACTIVITY LIMITATIONS carrying, lifting, bending, standing, squatting, stairs, transfers, locomotion level, and caring for others  PARTICIPATION LIMITATIONS: meal prep, cleaning, laundry, shopping, community activity, and yard work  [place], Past/current experiences, Time since onset of injury/illness/exacerbation, and 3+ comorbidities: RA, OA, HTN, obesity  are also affecting patient's functional outcome.    REHAB POTENTIAL: Good  CLINICAL DECISION MAKING: Stable/uncomplicated  EVALUATION COMPLEXITY: Low   GOALS: Goals reviewed with patient? Yes  SHORT TERM GOALS: Target date: 12/06/2021  Patient will be independent with HEP in order to improve functional outcomes. Baseline:  Goal status: INITIAL  2.  Patient will report at least 25% improvement in symptoms for improved quality of life. Baseline:  Goal status: INITIAL    LONG TERM GOALS: Target date: 12/27/2021  Patient will report at least 75% improvement in symptoms for improved quality of life. Baseline:  Goal status: INITIAL  2.  Patient will improve FOTO score by at least 17 points in order to indicate improved tolerance to activity. Baseline: 47% function Goal status: INITIAL  3.  Patient will demonstrate at least 25% improvement in lumbar ROM in all restricted planes for improved ability to move trunk while completing chores. Baseline: see AROM Goal status: INITIAL  4.  Patient will be able to ambulate at least 400 feet in 2MWT in order to demonstrate improved tolerance to activity. Baseline: 310 feet Goal status: INITIAL  5.  Patient will demonstrate grade of 5/5 MMT grade in all tested musculature as evidence of improved strength to assist with stair ambulation and gait.   Baseline: see MMT Goal status: INITIAL  6.  Patient will be able to complete 5x STS in under 11.4 seconds in order to reduce the risk of falls. Baseline: 20.31 seconds Goal status: INITIAL    PLAN: PT FREQUENCY: 2x/week  PT DURATION: 6 weeks  PLANNED INTERVENTIONS: Therapeutic exercises, Therapeutic activity, Neuromuscular re-education, Balance training, Gait training, Patient/Family education, Joint manipulation, Joint mobilization, Stair training, Orthotic/Fit training, DME instructions, Aquatic Therapy, Dry Needling, Electrical stimulation, Spinal manipulation, Spinal mobilization, Cryotherapy, Moist heat, Compression bandaging, scar  mobilization, Splintting, Taping, Traction, Ultrasound, Ionotophoresis 4mg /ml Dexamethasone, and Manual therapy  PLAN FOR NEXT SESSION: glute and core strength, f/u with HEP and progress as able, functional strength    Mearl Latin, PT 11/29/2021, 7:33 AM

## 2021-12-01 ENCOUNTER — Encounter (HOSPITAL_COMMUNITY): Payer: Self-pay

## 2021-12-01 ENCOUNTER — Ambulatory Visit (HOSPITAL_COMMUNITY): Payer: Medicare HMO

## 2021-12-01 DIAGNOSIS — M5459 Other low back pain: Secondary | ICD-10-CM | POA: Diagnosis not present

## 2021-12-01 DIAGNOSIS — M6281 Muscle weakness (generalized): Secondary | ICD-10-CM

## 2021-12-01 DIAGNOSIS — R29898 Other symptoms and signs involving the musculoskeletal system: Secondary | ICD-10-CM | POA: Diagnosis not present

## 2021-12-01 DIAGNOSIS — R2689 Other abnormalities of gait and mobility: Secondary | ICD-10-CM

## 2021-12-01 NOTE — Therapy (Addendum)
OUTPATIENT PHYSICAL THERAPY TREATMENT   Patient Name: Jessica Pacheco MRN: [id number] DOB:January 20, 1976, 46 y.o., female Today's Date: 12/01/2021   PT End of Session - 12/01/21 1715     Visit Number 4    Number of Visits 12    Date for PT Re-Evaluation 12/27/21    Authorization Type [employer]    Progress Note Due on Visit 10    PT Start Time 1740    PT Stop Time 1820    PT Time Calculation (min) 40 min    Activity Tolerance Patient tolerated treatment well    Behavior During Therapy WFL for tasks assessed/performed              Past Medical History:  Diagnosis Date   Anxiety    Chronic right lower quadrant pain    DDD (degenerative disc disease), cervical    c6-7   Depression    Dyspnea    On exertion at times   GERD (gastroesophageal reflux disease)    WATCHES DIET   History of hypoglycemia    History of kidney stones    passed spontaneously   Hypertension    Hypothyroidism    OA (osteoarthritis)    knees , hips, feet   PONV (postoperative nausea and vomiting)    Rheumatoid arthritis(714.0) dx  2001  multiple sites   rheumotologist-  dr Herbie [place] gay w/ [facility]--- currently treated w/ oral arava and iv actemra   Shingles    SUI (stress urinary incontinence), female    Past Surgical History:  Procedure Laterality Date   COLONOSCOPY WITH PROPOFOL N/A 10/27/2021   Procedure: COLONOSCOPY WITH PROPOFOL;  Surgeon: Harvel Quale, MD;  Location: [facility];  Service: Gastroenterology;  Laterality: N/A;   DILATION AND CURETTAGE OF UTERUS     KNEE ARTHROSCOPY Right 1999   KNEE ARTHROSCOPY WITH MEDIAL MENISECTOMY Right 02/07/2018   Procedure: KNEE ARTHROSCOPY WITH PARTIAL LATERAL MENISECTOMY;  Surgeon: Carole Civil, MD;  Location: [facility];  Service: Orthopedics;  Laterality: Right;   LAPAROSCOPY N/A 02/16/2017   Procedure: LAPAROSCOPY DIAGNOSTIC;  Surgeon: Molli Posey, MD;  Location: [facility];  Service: Gynecology;   Laterality: N/A;   TOTAL HIP ARTHROPLASTY Right 07/30/2020   Procedure: RIGHT TOTAL HIP ARTHROPLASTY ANTERIOR APPROACH;  Surgeon: Mcarthur Rossetti, MD;  Location: [facility];  Service: Orthopedics;  Laterality: Right;   WISDOM TOOTH EXTRACTION     Patient Active Problem List   Diagnosis Date Noted   Status post total replacement of right hip 07/30/2020   Hypertensive disorder 05/14/2019   Hypothyroidism 05/14/2019   Migraine 05/14/2019   Ovarian endometriosis 05/14/2019   S/P right knee arthroscopy 02/07/18 02/07/2018   Meniscus, lateral, derangement, right    Synovitis of right knee    Arthritis of right knee    Osteoarthritis, multiple sites 01/14/2018   Rheumatoid arthritis of multiple sites with negative rheumatoid factor ([place]) 05/08/2016   Primary osteoarthritis of both knees 08/30/2015   Primary osteoarthritis of right hip 08/30/2015   Primary osteoarthritis of left foot 06/30/2014   Change in stool 07/27/2011   Contraception 01/02/2011   Rheumatoid arthritis(714.0) 02/21/1999    PCP: Asencion Noble MD  REFERRING PROVIDER: Mcarthur Rossetti, MD   REFERRING DIAG: [id number] (ICD-10-CM) - History of right hip replacement M54.41 (ICD-10-CM) - Acute right-sided low back pain with right-sided sciatica   Rationale for Evaluation and Treatment Rehabilitation  THERAPY DIAG:  Other low back pain  Muscle weakness (generalized)  Other abnormalities  of gait and mobility  Other symptoms and signs involving the musculoskeletal system  ONSET DATE: July 2023  SUBJECTIVE:                                                                                                                                                                                           SUBJECTIVE STATEMENT: Pt states she has some soreness today in Rt lower back, buttock around to groin.  Stated she has not done as much today so pain is low. Exercises are going well at home.    PERTINENT HISTORY:  RA, OA,  HTN, obesity, sedentary lifestyle   PAIN:  Are you having pain? Yes: NPRS scale: 2/10 Pain location: low back Pain description: tightness/discomfort/soreness Aggravating factors: standing Relieving factors: rest   PRECAUTIONS: None  WEIGHT BEARING RESTRICTIONS No  FALLS:  Has patient fallen in last 6 months? No  LIVING ENVIRONMENT: Lives with: lives with their spouse Lives in: House/apartment Stairs: Yes: External: 3 steps; none Has following equipment at home: Single point cane, Walker - 2 wheeled, and bed side commode  OCCUPATION: disability  PLOF: Independent  PATIENT GOALS pain reduction   OBJECTIVE:   DIAGNOSTIC FINDINGS:  XR 10/12/21 An [facility] pelvis and lateral of the right hip shows a well-seated total hip arthroplasty with no complicating features.  PATIENT SURVEYS:  FOTO 47% function  SCREENING FOR RED FLAGS: Bowel or bladder incontinence: No Spinal tumors: No Cauda equina syndrome: No Compression fracture: No Abdominal aneurysm: No  COGNITION:  Overall cognitive status: Within functional limits for tasks assessed     SENSATION: WFL   POSTURE: rounded shoulders and forward head  PALPATION: TTP lower R lumbar paraspinals, glutes  LUMBAR ROM:   Active  A/PROM  eval  Flexion 25% limited *  Extension 50% limited*  Right lateral flexion 0% limited *  Left lateral flexion 0% limited  Right rotation 25% limited *  Left rotation 25% limited    (Blank rows = not tested) *= pain  LOWER EXTREMITY ROM:   WFL for tasks assessed  Active  Right eval Left eval  Hip flexion    Hip extension    Hip abduction    Hip adduction    Hip internal rotation    Hip external rotation    Knee flexion    Knee extension    Ankle dorsiflexion    Ankle plantarflexion    Ankle inversion    Ankle eversion     (Blank rows = not tested)  LOWER EXTREMITY MMT:    MMT Right eval Left eval  Hip flexion 4+* 5  Hip extension 4* 4  Hip abduction 4* 4+  Hip  adduction    Hip internal rotation    Hip external rotation    Knee flexion 5 5  Knee extension 5 5  Ankle dorsiflexion 5 5  Ankle plantarflexion    Ankle inversion    Ankle eversion     (Blank rows = not tested)   FUNCTIONAL TESTS:  5 times sit to stand: 20.31 seconds, hands on thighs, slight reliance on LLE>RLE 2 minute walk test: 310 feet Transfers: labored, pain with transfer  GAIT: Distance walked: 310 feet Assistive device utilized: None Level of assistance: Complete Independence Comments: 2MWT, gradual increase in symptoms beginning at 1 minute with gradual worsening Mild antalgic with gradual worsening, mild Trendelenburg with worsening after 1 minute    TODAY'S TREATMENT  12/01/21 3D hip excursion 10x (weight shifting, rotation, squat then extension) Marching with UE/opposite LE with ab set   [place] NBOS 2x 10 bilateral GTB   Tandem stance 2x 30"   Quadruped: cat/camel 10x 10"     Hip extension 10x     UE/LE 5x   Prone: 1 min  11/29/21 Press up 1x 10  Standing lumbar extension/hip dip at counter 1 x 10  March with ab set 3x 8 alternating Bridge with clamshell GTB 2x 10  DKTC with heels on green ball 2x 10 5 second holds SLR 2 x 10 bilateral  Lateral stepping GTB at knees 4x 10 feet bilateral  STS 1x 10  Palof 2x 10 bilateral GTB  11/16/21 Bridge 2x 10 Ab set 10x 5 second holds March with ab set 3x 8 alternating POE 1 minute Press up 1x 10  Prone hip extension 2 x 10 bilateral  LTR 10 x 5 second holds bilateral  Sidelying hip abduction 2x 10 bilateral  STS 1x 10   11/15/21 Bridge 2x 10 Ab set 10x 5 second holds   PATIENT EDUCATION:  Education details: 11/16/21: HEP;  Eval: Patient educated on exam findings, POC, scope of PT, HEP, and bed mobility. Person educated: Patient Education method: Explanation, Demonstration, and Handouts Education comprehension: verbalized understanding, returned demonstration, verbal cues required, and tactile cues  required  HOME EXERCISE PROGRAM: Access Code: [email or web address] 11/29/21 - Bridge with Hip Abduction and Resistance - Ground Touches  - 1 x daily - 7 x weekly - 2 sets - 10 reps - Supine Hamstring Swiss Ball Curls/Dynamic Mobilization  - 1 x daily - 7 x weekly - 2 sets - 10 reps - 5 second hold - Active Straight Leg Raise with Quad Set  - 1 x daily - 7 x weekly - 2 sets - 10 reps - Side Stepping with Resistance at Thighs  - 1 x daily - 7 x weekly - 4 sets - 10 reps  11/16/21 - Supine March with Posterior Pelvic Tilt  - 1 x daily - 7 x weekly - 2 sets - 10 reps - Prone Press Up  - 2-3 x daily - 7 x weekly - 2 sets - 10 reps - Prone Hip Extension  - 1 x daily - 7 x weekly - 2 sets - 10 reps - Supine Lower Trunk Rotation  - 1 x daily - 7 x weekly - 10 reps - 5 second hold - Sidelying Hip Abduction  - 1 x daily - 7 x weekly - 2 sets - 10 reps - Sit to Stand  - 1 x daily - 7 x weekly - 2 sets - 10 reps  Date: 11/15/2021 - Supine Bridge  -  2-3 x daily - 7 x weekly - 2 sets - 10 reps - Abdominal Bracing  - 2-3 x daily - 7 x weekly - 2 sets - 10 reps - 5 second hold  ASSESSMENT:  CLINICAL IMPRESSION: Progressed to increased standing and quadruped exercises to improve mobility and core/hip strengthening.  Pt tolerated well through session with no reports of increased pain through session.  Monitored Rt knee through session with no c/o.  Added functional squats and quadruped cat/camel and UE/LE extension to HEP with handout and folder given.   OBJECTIVE IMPAIRMENTS Abnormal gait, decreased activity tolerance, decreased balance, decreased endurance, decreased mobility, difficulty walking, decreased ROM, decreased strength, hypomobility, increased muscle spasms, impaired flexibility, improper body mechanics, postural dysfunction, and pain.   ACTIVITY LIMITATIONS carrying, lifting, bending, standing, squatting, stairs, transfers, locomotion level, and caring for others  PARTICIPATION LIMITATIONS: meal prep,  cleaning, laundry, shopping, community activity, and yard work  [place], Past/current experiences, Time since onset of injury/illness/exacerbation, and 3+ comorbidities: RA, OA, HTN, obesity  are also affecting patient's functional outcome.   REHAB POTENTIAL: Good  CLINICAL DECISION MAKING: Stable/uncomplicated  EVALUATION COMPLEXITY: Low   GOALS: Goals reviewed with patient? Yes  SHORT TERM GOALS: Target date: 12/06/2021  Patient will be independent with HEP in order to improve functional outcomes. Baseline:  Goal status: IN PROGRESS  2.  Patient will report at least 25% improvement in symptoms for improved quality of life. Baseline:  Goal status: IN PROGRESS    LONG TERM GOALS: Target date: 12/27/2021  Patient will report at least 75% improvement in symptoms for improved quality of life. Baseline:  Goal status: IN PROGRESS  2.  Patient will improve FOTO score by at least 17 points in order to indicate improved tolerance to activity. Baseline: 47% function Goal status: IN PROGRESS  3.  Patient will demonstrate at least 25% improvement in lumbar ROM in all restricted planes for improved ability to move trunk while completing chores. Baseline: see AROM Goal status: IN PROGRESS  4.  Patient will be able to ambulate at least 400 feet in 2MWT in order to demonstrate improved tolerance to activity. Baseline: 310 feet Goal status: IN PROGRESS  5.  Patient will demonstrate grade of 5/5 MMT grade in all tested musculature as evidence of improved strength to assist with stair ambulation and gait.   Baseline: see MMT Goal status: IN PROGRESS  6.  Patient will be able to complete 5x STS in under 11.4 seconds in order to reduce the risk of falls. Baseline: 20.31 seconds Goal status: IN PROGRESS    PLAN: PT FREQUENCY: 2x/week  PT DURATION: 6 weeks  PLANNED INTERVENTIONS: Therapeutic exercises, Therapeutic activity, Neuromuscular re-education, Balance  training, Gait training, Patient/Family education, Joint manipulation, Joint mobilization, Stair training, Orthotic/Fit training, DME instructions, Aquatic Therapy, Dry Needling, Electrical stimulation, Spinal manipulation, Spinal mobilization, Cryotherapy, Moist heat, Compression bandaging, scar mobilization, Splintting, Taping, Traction, Ultrasound, Ionotophoresis 4mg /ml Dexamethasone, and Manual therapy  PLAN FOR NEXT SESSION: glute and core strength, f/u with HEP and progress as able, functional strength   Ihor Austin, LPTA/CLT; CBIS [phone]  Aldona Lento, PTA 12/01/2021, 5:16 PM

## 2021-12-05 DIAGNOSIS — M0609 Rheumatoid arthritis without rheumatoid factor, multiple sites: Secondary | ICD-10-CM | POA: Diagnosis not present

## 2021-12-05 DIAGNOSIS — Z79899 Other long term (current) drug therapy: Secondary | ICD-10-CM | POA: Diagnosis not present

## 2021-12-05 DIAGNOSIS — M170 Bilateral primary osteoarthritis of knee: Secondary | ICD-10-CM | POA: Diagnosis not present

## 2021-12-06 ENCOUNTER — Encounter (HOSPITAL_COMMUNITY): Payer: Medicare HMO | Admitting: Physical Therapy

## 2021-12-08 ENCOUNTER — Encounter (HOSPITAL_COMMUNITY): Payer: Self-pay

## 2021-12-08 ENCOUNTER — Ambulatory Visit (HOSPITAL_COMMUNITY): Payer: Medicare HMO

## 2021-12-08 DIAGNOSIS — R29898 Other symptoms and signs involving the musculoskeletal system: Secondary | ICD-10-CM

## 2021-12-08 DIAGNOSIS — M5459 Other low back pain: Secondary | ICD-10-CM

## 2021-12-08 DIAGNOSIS — M6281 Muscle weakness (generalized): Secondary | ICD-10-CM

## 2021-12-08 DIAGNOSIS — R2689 Other abnormalities of gait and mobility: Secondary | ICD-10-CM

## 2021-12-08 NOTE — Therapy (Signed)
OUTPATIENT PHYSICAL THERAPY TREATMENT   Patient Name: Jessica Pacheco MRN: [id number] DOB:[birth year], 46 y.o., female Today's Date: 12/08/2021   PT End of Session - 12/08/21 0814     Visit Number 5    Number of Visits 12    Date for PT Re-Evaluation 12/27/21    Authorization Type [employer]    Progress Note Due on Visit 10    PT Start Time 0814    PT Stop Time 0856    PT Time Calculation (min) 42 min    Activity Tolerance Patient tolerated treatment well    Behavior During Therapy [facility] for tasks assessed/performed              Past Medical History:  Diagnosis Date   Anxiety    Chronic right lower quadrant pain    DDD (degenerative disc disease), cervical    c6-7   Depression    Dyspnea    On exertion at times   GERD (gastroesophageal reflux disease)    WATCHES DIET   History of hypoglycemia    History of kidney stones    passed spontaneously   Hypertension    Hypothyroidism    OA (osteoarthritis)    knees , hips, feet   PONV (postoperative nausea and vomiting)    Rheumatoid arthritis(714.0) dx  2001  multiple sites   rheumotologist-  dr Herbie [place] gay w/ [facility]--- currently treated w/ oral arava and iv actemra   Shingles    SUI (stress urinary incontinence), female    Past Surgical History:  Procedure Laterality Date   COLONOSCOPY WITH PROPOFOL N/A 10/27/2021   Procedure: COLONOSCOPY WITH PROPOFOL;  Surgeon: Harvel Quale, MD;  Location: [facility];  Service: Gastroenterology;  Laterality: N/A;   DILATION AND CURETTAGE OF UTERUS     KNEE ARTHROSCOPY Right 1999   KNEE ARTHROSCOPY WITH MEDIAL MENISECTOMY Right 02/07/2018   Procedure: KNEE ARTHROSCOPY WITH PARTIAL LATERAL MENISECTOMY;  Surgeon: Carole Civil, MD;  Location: [facility];  Service: Orthopedics;  Laterality: Right;   LAPAROSCOPY N/A 02/16/2017   Procedure: LAPAROSCOPY DIAGNOSTIC;  Surgeon: Molli Posey, MD;  Location: [facility];  Service: Gynecology;   Laterality: N/A;   TOTAL HIP ARTHROPLASTY Right 07/30/2020   Procedure: RIGHT TOTAL HIP ARTHROPLASTY ANTERIOR APPROACH;  Surgeon: Mcarthur Rossetti, MD;  Location: [facility];  Service: Orthopedics;  Laterality: Right;   WISDOM TOOTH EXTRACTION     Patient Active Problem List   Diagnosis Date Noted   Status post total replacement of right hip 07/30/2020   Hypertensive disorder 05/14/2019   Hypothyroidism 05/14/2019   Migraine 05/14/2019   Ovarian endometriosis 05/14/2019   S/P right knee arthroscopy 02/07/18 02/07/2018   Meniscus, lateral, derangement, right    Synovitis of right knee    Arthritis of right knee    Osteoarthritis, multiple sites 01/14/2018   Rheumatoid arthritis of multiple sites with negative rheumatoid factor ([place]) 05/08/2016   Primary osteoarthritis of both knees 08/30/2015   Primary osteoarthritis of right hip 08/30/2015   Primary osteoarthritis of left foot 06/30/2014   Change in stool 07/27/2011   Contraception 01/02/2011   Rheumatoid arthritis(714.0) 02/21/1999    PCP: Asencion Noble MD  REFERRING PROVIDER: Mcarthur Rossetti, MD  12/12/21  REFERRING DIAG: [phone] (ICD-10-CM) - History of right hip replacement M54.41 (ICD-10-CM) - Acute right-sided low back pain with right-sided sciatica   Rationale for Evaluation and Treatment Rehabilitation  THERAPY DIAG:  Other low back pain  Muscle weakness (generalized)  Other  abnormalities of gait and mobility  Other symptoms and signs involving the musculoskeletal system  ONSET DATE: July 2023  SUBJECTIVE:                                                                                                                                                                                           SUBJECTIVE STATEMENT: Reports went to MD and stopped Celebrex and recommended Voltaren cream.  Current pain scale 2/10 LBP and Rt buttock, constant soreness.  PERTINENT HISTORY:  RA, OA, HTN, obesity, sedentary  lifestyle   PAIN:  Are you having pain? Yes: NPRS scale: 2/10 Pain location: low back Pain description: tightness/discomfort/soreness Aggravating factors: standing Relieving factors: rest   PRECAUTIONS: None  WEIGHT BEARING RESTRICTIONS No  FALLS:  Has patient fallen in last 6 months? No  LIVING ENVIRONMENT: Lives with: lives with their spouse Lives in: House/apartment Stairs: Yes: External: 3 steps; none Has following equipment at home: Single point cane, Walker - 2 wheeled, and bed side commode  OCCUPATION: disability  PLOF: Independent  PATIENT GOALS pain reduction   OBJECTIVE:   DIAGNOSTIC FINDINGS:  XR 10/12/21 An [facility] pelvis and lateral of the right hip shows a well-seated total hip arthroplasty with no complicating features.  PATIENT SURVEYS:  FOTO 47% function  SCREENING FOR RED FLAGS: Bowel or bladder incontinence: No Spinal tumors: No Cauda equina syndrome: No Compression fracture: No Abdominal aneurysm: No  COGNITION:  Overall cognitive status: Within functional limits for tasks assessed     SENSATION: WFL   POSTURE: rounded shoulders and forward head  PALPATION: TTP lower R lumbar paraspinals, glutes  LUMBAR ROM:   Active  A/PROM  eval  Flexion 25% limited *  Extension 50% limited*  Right lateral flexion 0% limited *  Left lateral flexion 0% limited  Right rotation 25% limited *  Left rotation 25% limited    (Blank rows = not tested) *= pain  LOWER EXTREMITY ROM:   WFL for tasks assessed  Active  Right eval Left eval  Hip flexion    Hip extension    Hip abduction    Hip adduction    Hip internal rotation    Hip external rotation    Knee flexion    Knee extension    Ankle dorsiflexion    Ankle plantarflexion    Ankle inversion    Ankle eversion     (Blank rows = not tested)  LOWER EXTREMITY MMT:    MMT Right eval Left eval  Hip flexion 4+* 5  Hip extension 4* 4  Hip abduction 4* 4+  Hip adduction    Hip internal  rotation    Hip external rotation    Knee flexion 5 5  Knee extension 5 5  Ankle dorsiflexion 5 5  Ankle plantarflexion    Ankle inversion    Ankle eversion     (Blank rows = not tested)   FUNCTIONAL TESTS:  5 times sit to stand: 20.31 seconds, hands on thighs, slight reliance on LLE>RLE 2 minute walk test: 310 feet Transfers: labored, pain with transfer  GAIT: Distance walked: 310 feet Assistive device utilized: None Level of assistance: Complete Independence Comments: 2MWT, gradual increase in symptoms beginning at 1 minute with gradual worsening Mild antalgic with gradual worsening, mild Trendelenburg with worsening after 1 minute    TODAY'S TREATMENT  12/07/21: Heel raise then squat  3D hip excursion (arm reach over with weight shifting, rotation with hands on hips, squat then extension)   Marching with UE/opposite LE with ab set   BTB pallof NBOS 20x x2   BTB shoulder extension paired with marching 10x 5"    Seated QL stretch with theraball 5x10"   Supine: SKTC 2x 30"  12/01/21 3D hip excursion 10x (weight shifting, rotation, squat then extension) Marching with UE/opposite LE with ab set   Pallof NBOS 2x 10 bilateral GTB   Tandem stance 2x 30"   Quadruped: cat/camel 10x 10"     Hip extension 10x     UE/LE 5x   Prone: 1 min  11/29/21 Press up 1x 10  Standing lumbar extension/hip dip at counter 1 x 10  March with ab set 3x 8 alternating Bridge with clamshell GTB 2x 10  DKTC with heels on green ball 2x 10 5 second holds SLR 2 x 10 bilateral  Lateral stepping GTB at knees 4x 10 feet bilateral  STS 1x 10  Palof 2x 10 bilateral GTB  11/16/21 Bridge 2x 10 Ab set 10x 5 second holds March with ab set 3x 8 alternating POE 1 minute Press up 1x 10  Prone hip extension 2 x 10 bilateral  LTR 10 x 5 second holds bilateral  Sidelying hip abduction 2x 10 bilateral  STS 1x 10   11/15/21 Bridge 2x 10 Ab set 10x 5 second holds   PATIENT EDUCATION:  Education  details: 11/16/21: HEP;  Eval: Patient educated on exam findings, POC, scope of PT, HEP, and bed mobility. Person educated: Patient Education method: Explanation, Demonstration, and Handouts Education comprehension: verbalized understanding, returned demonstration, verbal cues required, and tactile cues required  HOME EXERCISE PROGRAM: Access Code: [email or web address] 12/07/21: SKTC, QL stretch with theraball  11/29/21 - Bridge with Hip Abduction and Resistance - Ground Touches  - 1 x daily - 7 x weekly - 2 sets - 10 reps - Supine Hamstring Swiss Ball Curls/Dynamic Mobilization  - 1 x daily - 7 x weekly - 2 sets - 10 reps - 5 second hold - Active Straight Leg Raise with Quad Set  - 1 x daily - 7 x weekly - 2 sets - 10 reps - Side Stepping with Resistance at Thighs  - 1 x daily - 7 x weekly - 4 sets - 10 reps  11/16/21 - Supine March with Posterior Pelvic Tilt  - 1 x daily - 7 x weekly - 2 sets - 10 reps - Prone Press Up  - 2-3 x daily - 7 x weekly - 2 sets - 10 reps - Prone Hip Extension  - 1 x daily - 7 x weekly - 2 sets - 10 reps - Supine Lower Trunk Rotation  -  1 x daily - 7 x weekly - 10 reps - 5 second hold - Sidelying Hip Abduction  - 1 x daily - 7 x weekly - 2 sets - 10 reps - Sit to Stand  - 1 x daily - 7 x weekly - 2 sets - 10 reps  Date: 11/15/2021 - Supine Bridge  - 2-3 x daily - 7 x weekly - 2 sets - 10 reps - Abdominal Bracing  - 2-3 x daily - 7 x weekly - 2 sets - 10 reps - 5 second hold  ASSESSMENT:  CLINICAL IMPRESSION: Session focus on core/gluteal strengthening and lumbar mobility.  Added theraball for QL stretch and SKTC to address tightness with positive reports following.  Pt with some unsteadiness with NBOS and marching due to core instability.  EOS reports pain reduced to 0.5/10.  OBJECTIVE IMPAIRMENTS Abnormal gait, decreased activity tolerance, decreased balance, decreased endurance, decreased mobility, difficulty walking, decreased ROM, decreased strength, hypomobility,  increased muscle spasms, impaired flexibility, improper body mechanics, postural dysfunction, and pain.   ACTIVITY LIMITATIONS carrying, lifting, bending, standing, squatting, stairs, transfers, locomotion level, and caring for others  PARTICIPATION LIMITATIONS: meal prep, cleaning, laundry, shopping, community activity, and yard work  [place], Past/current experiences, Time since onset of injury/illness/exacerbation, and 3+ comorbidities: RA, OA, HTN, obesity  are also affecting patient's functional outcome.   REHAB POTENTIAL: Good  CLINICAL DECISION MAKING: Stable/uncomplicated  EVALUATION COMPLEXITY: Low   GOALS: Goals reviewed with patient? Yes  SHORT TERM GOALS: Target date: 12/06/2021  Patient will be independent with HEP in order to improve functional outcomes. Baseline:  Goal status: IN PROGRESS  2.  Patient will report at least 25% improvement in symptoms for improved quality of life. Baseline:  Goal status: IN PROGRESS    LONG TERM GOALS: Target date: 12/27/2021  Patient will report at least 75% improvement in symptoms for improved quality of life. Baseline:  Goal status: IN PROGRESS  2.  Patient will improve FOTO score by at least 17 points in order to indicate improved tolerance to activity. Baseline: 47% function Goal status: IN PROGRESS  3.  Patient will demonstrate at least 25% improvement in lumbar ROM in all restricted planes for improved ability to move trunk while completing chores. Baseline: see AROM Goal status: IN PROGRESS  4.  Patient will be able to ambulate at least 400 feet in 2MWT in order to demonstrate improved tolerance to activity. Baseline: 310 feet Goal status: IN PROGRESS  5.  Patient will demonstrate grade of 5/5 MMT grade in all tested musculature as evidence of improved strength to assist with stair ambulation and gait.   Baseline: see MMT Goal status: IN PROGRESS  6.  Patient will be able to complete 5x STS in  under 11.4 seconds in order to reduce the risk of falls. Baseline: 20.31 seconds Goal status: IN PROGRESS    PLAN: PT FREQUENCY: 2x/week  PT DURATION: 6 weeks  PLANNED INTERVENTIONS: Therapeutic exercises, Therapeutic activity, Neuromuscular re-education, Balance training, Gait training, Patient/Family education, Joint manipulation, Joint mobilization, Stair training, Orthotic/Fit training, DME instructions, Aquatic Therapy, Dry Needling, Electrical stimulation, Spinal manipulation, Spinal mobilization, Cryotherapy, Moist heat, Compression bandaging, scar mobilization, Splintting, Taping, Traction, Ultrasound, Ionotophoresis 4mg /ml Dexamethasone, and Manual therapy  PLAN FOR NEXT SESSION: glute and core strength, f/u with HEP and progress as able, functional strength.  Add vector stance.  Ihor Austin, LPTA/CLT; CBIS [phone]  Aldona Lento, PTA 12/08/2021, 8:58 AM

## 2021-12-12 ENCOUNTER — Other Ambulatory Visit: Payer: Self-pay

## 2021-12-12 ENCOUNTER — Ambulatory Visit (INDEPENDENT_AMBULATORY_CARE_PROVIDER_SITE_OTHER): Payer: Medicare HMO

## 2021-12-12 ENCOUNTER — Ambulatory Visit: Payer: Medicare HMO | Admitting: Orthopaedic Surgery

## 2021-12-12 ENCOUNTER — Encounter: Payer: Self-pay | Admitting: Orthopaedic Surgery

## 2021-12-12 DIAGNOSIS — G8929 Other chronic pain: Secondary | ICD-10-CM

## 2021-12-12 DIAGNOSIS — M5441 Lumbago with sciatica, right side: Secondary | ICD-10-CM | POA: Diagnosis not present

## 2021-12-12 NOTE — Progress Notes (Signed)
The patient is very well-known to me.  She is 46 year old who is 16 months out from a right hip replacement.  The right hip is doing very well.  Although she is only 30 she had profound end-stage arthritis of her right hip.  She has a combination as well as rheumatoid disease and osteoarthritis.  She had been dealing with right-sided low back pain for some time now and a sciatic component of this pain.  I had put her on some steroids and sent her to physical therapy.  She is seeing a Restaurant manager, fast food.  We also put her on Celebrex but she has had an an increase in her creatinine so her primary care physician appropriately took her off of any anti-inflammatories.  She is still having the same pain so we decided to obtain plain films of her lumbar spine today.  The x-rays of the lumbar spine are reviewed with her.  She has severe disc disease with disc space loss which is quite significant between L3 and L4 as well as a spondylolisthesis at this level.  I was able to at least pull up the CT scan of her abdomen pelvis from 2017 and look at the bone windows and it did not show any significant disc disease or malalignment of her spine at all then but that was 6 years ago.  This is significantly worsened.  On exam her pain is still consistent with sciatica to the right side and right-sided low back pain.  At this point based on the plain film findings and the failure of conservative treatment, a MRI of the lumbar spine is warranted especially to assess the L3-L4 level based on plain film findings showing such significant changes in her spine.  We will see her in follow-up once we have this MRI.

## 2021-12-13 ENCOUNTER — Ambulatory Visit (HOSPITAL_COMMUNITY): Payer: Medicare HMO | Admitting: Physical Therapy

## 2021-12-13 DIAGNOSIS — M6281 Muscle weakness (generalized): Secondary | ICD-10-CM | POA: Diagnosis not present

## 2021-12-13 DIAGNOSIS — R2689 Other abnormalities of gait and mobility: Secondary | ICD-10-CM

## 2021-12-13 DIAGNOSIS — M4306 Spondylolysis, lumbar region: Secondary | ICD-10-CM | POA: Insufficient documentation

## 2021-12-13 DIAGNOSIS — M5459 Other low back pain: Secondary | ICD-10-CM

## 2021-12-13 DIAGNOSIS — R29898 Other symptoms and signs involving the musculoskeletal system: Secondary | ICD-10-CM | POA: Diagnosis not present

## 2021-12-13 NOTE — Therapy (Signed)
OUTPATIENT PHYSICAL THERAPY TREATMENT   Patient Name: Jessica Pacheco MRN: [id number] DOB:1975-06-04, 46 y.o., female Today's Date: 12/13/2021   PT End of Session - 12/13/21 0914     Visit Number 6    Number of Visits 12    Date for PT Re-Evaluation 12/27/21    Authorization Type [employer]    Progress Note Due on Visit 10    PT Start Time 0901    PT Stop Time 0944    PT Time Calculation (min) 43 min    Activity Tolerance Patient tolerated treatment well    Behavior During Therapy WFL for tasks assessed/performed              Past Medical History:  Diagnosis Date   Anxiety    Chronic right lower quadrant pain    DDD (degenerative disc disease), cervical    c6-7   Depression    Dyspnea    On exertion at times   GERD (gastroesophageal reflux disease)    WATCHES DIET   History of hypoglycemia    History of kidney stones    passed spontaneously   Hypertension    Hypothyroidism    OA (osteoarthritis)    knees , hips, feet   PONV (postoperative nausea and vomiting)    Rheumatoid arthritis(714.0) dx  2001  multiple sites   rheumotologist-  dr Herbie [place] gay w/ [facility]--- currently treated w/ oral arava and iv actemra   Shingles    SUI (stress urinary incontinence), female    Past Surgical History:  Procedure Laterality Date   COLONOSCOPY WITH PROPOFOL N/A 10/27/2021   Procedure: COLONOSCOPY WITH PROPOFOL;  Surgeon: Harvel Quale, MD;  Location: [facility];  Service: Gastroenterology;  Laterality: N/A;   DILATION AND CURETTAGE OF UTERUS     KNEE ARTHROSCOPY Right 1999   KNEE ARTHROSCOPY WITH MEDIAL MENISECTOMY Right 02/07/2018   Procedure: KNEE ARTHROSCOPY WITH PARTIAL LATERAL MENISECTOMY;  Surgeon: Carole Civil, MD;  Location: [facility];  Service: Orthopedics;  Laterality: Right;   LAPAROSCOPY N/A 02/16/2017   Procedure: LAPAROSCOPY DIAGNOSTIC;  Surgeon: Molli Posey, MD;  Location: [facility];  Service: Gynecology;   Laterality: N/A;   TOTAL HIP ARTHROPLASTY Right 07/30/2020   Procedure: RIGHT TOTAL HIP ARTHROPLASTY ANTERIOR APPROACH;  Surgeon: Mcarthur Rossetti, MD;  Location: [facility];  Service: Orthopedics;  Laterality: Right;   WISDOM TOOTH EXTRACTION     Patient Active Problem List   Diagnosis Date Noted   Status post total replacement of right hip 07/30/2020   Hypertensive disorder 05/14/2019   Hypothyroidism 05/14/2019   Migraine 05/14/2019   Ovarian endometriosis 05/14/2019   S/P right knee arthroscopy 02/07/18 02/07/2018   Meniscus, lateral, derangement, right    Synovitis of right knee    Arthritis of right knee    Osteoarthritis, multiple sites 01/14/2018   Rheumatoid arthritis of multiple sites with negative rheumatoid factor ([place]) 05/08/2016   Primary osteoarthritis of both knees 08/30/2015   Primary osteoarthritis of right hip 08/30/2015   Primary osteoarthritis of left foot 06/30/2014   Change in stool 07/27/2011   Contraception 01/02/2011   Rheumatoid arthritis(714.0) 02/21/1999    PCP: Asencion Noble MD  REFERRING PROVIDER: Mcarthur Rossetti, MD  12/12/21  REFERRING DIAG: [phone] (ICD-10-CM) - History of right hip replacement M54.41 (ICD-10-CM) - Acute right-sided low back pain with right-sided sciatica   Rationale for Evaluation and Treatment Rehabilitation  THERAPY DIAG:  Other low back pain  Muscle weakness (generalized)  Other  abnormalities of gait and mobility  Other symptoms and signs involving the musculoskeletal system  ONSET DATE: July 2023  SUBJECTIVE:                                                                                                                                                                                           SUBJECTIVE STATEMENT: Reports her Xrays show some vertebral displacement.  Reports she is going an MRI ordered by her orthopedist and may then have a spine specialist consult pending the MRI results.  Pain is up today  at 5/10 LBP and Rt buttock, constant soreness.  **Pt is also seeing Chiropractor 3-4X a month  PERTINENT HISTORY:  RA, OA, HTN, obesity, sedentary lifestyle   PAIN:  Are you having pain? Yes: NPRS scale: 5/10 Pain location: low back and into Rt hip/LE Pain description: tightness/discomfort/soreness Aggravating factors: standing Relieving factors: rest   PRECAUTIONS: None  WEIGHT BEARING RESTRICTIONS No  FALLS:  Has patient fallen in last 6 months? No  LIVING ENVIRONMENT: Lives with: lives with their spouse Lives in: House/apartment Stairs: Yes: External: 3 steps; none Has following equipment at home: Single point cane, Walker - 2 wheeled, and bed side commode  OCCUPATION: disability  PLOF: Independent  PATIENT GOALS pain reduction   OBJECTIVE:   DIAGNOSTIC FINDINGS:  XR 10/12/21 An [facility] pelvis and lateral of the right hip shows a well-seated total hip arthroplasty with no complicating features.  PATIENT SURVEYS:  FOTO 47% function  SCREENING FOR RED FLAGS: Bowel or bladder incontinence: No Spinal tumors: No Cauda equina syndrome: No Compression fracture: No Abdominal aneurysm: No  COGNITION:  Overall cognitive status: Within functional limits for tasks assessed     SENSATION: WFL   POSTURE: rounded shoulders and forward head  PALPATION: TTP lower R lumbar paraspinals, glutes  LUMBAR ROM:   Active  A/PROM  eval  Flexion 25% limited *  Extension 50% limited*  Right lateral flexion 0% limited *  Left lateral flexion 0% limited  Right rotation 25% limited *  Left rotation 25% limited    (Blank rows = not tested) *= pain  LOWER EXTREMITY ROM:   WFL for tasks assessed  LOWER EXTREMITY MMT:    MMT Right eval Left eval  Hip flexion 4+* 5  Hip extension 4* 4  Hip abduction 4* 4+  Hip adduction    Hip internal rotation    Hip external rotation    Knee flexion 5 5  Knee extension 5 5  Ankle dorsiflexion 5 5  Ankle plantarflexion    Ankle  inversion    Ankle eversion     (Blank  rows = not tested)   FUNCTIONAL TESTS:  5 times sit to stand: 20.31 seconds, hands on thighs, slight reliance on LLE>RLE 2 minute walk test: 310 feet Transfers: labored, pain with transfer  GAIT: Distance walked: 310 feet Assistive device utilized: None Level of assistance: Complete Independence Comments: 2MWT, gradual increase in symptoms beginning at 1 minute with gradual worsening Mild antalgic with gradual worsening, mild Trendelenburg with worsening after 1 minute    TODAY'S TREATMENT  12/13/21 3D hip excursions SKTC 3X30" LTR with red physioball 10X each Bridge with red physioball 10X each SLR 10x each with stab  12/07/21: Heel raise then squat  3D hip excursion (arm reach over with weight shifting, rotation with hands on hips, squat then extension)   Marching with UE/opposite LE with ab set   BTB pallof NBOS 20x x2   BTB shoulder extension paired with marching 10x 5"    Seated QL stretch with theraball 5x10"   Supine: SKTC 2x 30"  12/01/21 3D hip excursion 10x (weight shifting, rotation, squat then extension) Marching with UE/opposite LE with ab set   Pallof NBOS 2x 10 bilateral GTB   Tandem stance 2x 30"   Quadruped: cat/camel 10x 10"     Hip extension 10x     UE/LE 5x   Prone: 1 min  11/29/21 Press up 1x 10  Standing lumbar extension/hip dip at counter 1 x 10  March with ab set 3x 8 alternating Bridge with clamshell GTB 2x 10  DKTC with heels on green ball 2x 10 5 second holds SLR 2 x 10 bilateral  Lateral stepping GTB at knees 4x 10 feet bilateral  STS 1x 10  Palof 2x 10 bilateral GTB  11/16/21 Bridge 2x 10 Ab set 10x 5 second holds March with ab set 3x 8 alternating POE 1 minute Press up 1x 10  Prone hip extension 2 x 10 bilateral  LTR 10 x 5 second holds bilateral  Sidelying hip abduction 2x 10 bilateral  STS 1x 10   11/15/21 Bridge 2x 10 Ab set 10x 5 second holds   PATIENT EDUCATION:   Education details: 11/16/21: HEP;  Eval: Patient educated on exam findings, POC, scope of PT, HEP, and bed mobility. Person educated: Patient Education method: Explanation, Demonstration, and Handouts Education comprehension: verbalized understanding, returned demonstration, verbal cues required, and tactile cues required  HOME EXERCISE PROGRAM: Access Code: [email or web address] 12/07/21: SKTC, QL stretch with theraball  11/29/21 - Bridge with Hip Abduction and Resistance - Ground Touches  - 1 x daily - 7 x weekly - 2 sets - 10 reps - Supine Hamstring Swiss Ball Curls/Dynamic Mobilization  - 1 x daily - 7 x weekly - 2 sets - 10 reps - 5 second hold - Active Straight Leg Raise with Quad Set  - 1 x daily - 7 x weekly - 2 sets - 10 reps - Side Stepping with Resistance at Thighs  - 1 x daily - 7 x weekly - 4 sets - 10 reps  11/16/21 - Supine March with Posterior Pelvic Tilt  - 1 x daily - 7 x weekly - 2 sets - 10 reps - Prone Press Up  - 2-3 x daily - 7 x weekly - 2 sets - 10 reps - Prone Hip Extension  - 1 x daily - 7 x weekly - 2 sets - 10 reps - Supine Lower Trunk Rotation  - 1 x daily - 7 x weekly - 10 reps - 5 second hold - Sidelying Hip  Abduction  - 1 x daily - 7 x weekly - 2 sets - 10 reps - Sit to Stand  - 1 x daily - 7 x weekly - 2 sets - 10 reps  Date: 11/15/2021 - Supine Bridge  - 2-3 x daily - 7 x weekly - 2 sets - 10 reps - Abdominal Bracing  - 2-3 x daily - 7 x weekly - 2 sets - 10 reps - 5 second hold  ASSESSMENT:  CLINICAL IMPRESSION: Session focus on core/gluteal strengthening and lumbar mobility.  Pain elevated this session so focused more on isometric core stabilization and stretches.  Began LTR and bridge with physioball for increased challenge.  Returns to chiropractor tomorrow with hopes he can adjust her spine back into alignment.   Pt reported being painfree at EOS.  Pt will continue to benefit from skilled therapy to reduce pain and dysfunction.   OBJECTIVE IMPAIRMENTS  Abnormal gait, decreased activity tolerance, decreased balance, decreased endurance, decreased mobility, difficulty walking, decreased ROM, decreased strength, hypomobility, increased muscle spasms, impaired flexibility, improper body mechanics, postural dysfunction, and pain.   ACTIVITY LIMITATIONS carrying, lifting, bending, standing, squatting, stairs, transfers, locomotion level, and caring for others  PARTICIPATION LIMITATIONS: meal prep, cleaning, laundry, shopping, community activity, and yard work  [place], Past/current experiences, Time since onset of injury/illness/exacerbation, and 3+ comorbidities: RA, OA, HTN, obesity  are also affecting patient's functional outcome.   REHAB POTENTIAL: Good  CLINICAL DECISION MAKING: Stable/uncomplicated  EVALUATION COMPLEXITY: Low   GOALS: Goals reviewed with patient? Yes  SHORT TERM GOALS: Target date: 12/06/2021  Patient will be independent with HEP in order to improve functional outcomes. Baseline:  Goal status: IN PROGRESS  2.  Patient will report at least 25% improvement in symptoms for improved quality of life. Baseline:  Goal status: IN PROGRESS    LONG TERM GOALS: Target date: 12/27/2021  Patient will report at least 75% improvement in symptoms for improved quality of life. Baseline:  Goal status: IN PROGRESS  2.  Patient will improve FOTO score by at least 17 points in order to indicate improved tolerance to activity. Baseline: 47% function Goal status: IN PROGRESS  3.  Patient will demonstrate at least 25% improvement in lumbar ROM in all restricted planes for improved ability to move trunk while completing chores. Baseline: see AROM Goal status: IN PROGRESS  4.  Patient will be able to ambulate at least 400 feet in 2MWT in order to demonstrate improved tolerance to activity. Baseline: 310 feet Goal status: IN PROGRESS  5.  Patient will demonstrate grade of 5/5 MMT grade in all tested musculature  as evidence of improved strength to assist with stair ambulation and gait.   Baseline: see MMT Goal status: IN PROGRESS  6.  Patient will be able to complete 5x STS in under 11.4 seconds in order to reduce the risk of falls. Baseline: 20.31 seconds Goal status: IN PROGRESS    PLAN: PT FREQUENCY: 2x/week  PT DURATION: 6 weeks  PLANNED INTERVENTIONS: Therapeutic exercises, Therapeutic activity, Neuromuscular re-education, Balance training, Gait training, Patient/Family education, Joint manipulation, Joint mobilization, Stair training, Orthotic/Fit training, DME instructions, Aquatic Therapy, Dry Needling, Electrical stimulation, Spinal manipulation, Spinal mobilization, Cryotherapy, Moist heat, Compression bandaging, scar mobilization, Splintting, Taping, Traction, Ultrasound, Ionotophoresis 4mg /ml Dexamethasone, and Manual therapy  PLAN FOR NEXT SESSION: glute and core strength, f/u with HEP and progress as able, functional strength.  Resume standing and add vector stance next session if pain reduced.  Teena Irani, PTA/CLT  [place] Ph: [phone]  Teena Irani, PTA 12/13/2021, 9:16 AM

## 2021-12-14 DIAGNOSIS — M9902 Segmental and somatic dysfunction of thoracic region: Secondary | ICD-10-CM | POA: Diagnosis not present

## 2021-12-14 DIAGNOSIS — S233XXA Sprain of ligaments of thoracic spine, initial encounter: Secondary | ICD-10-CM | POA: Diagnosis not present

## 2021-12-14 DIAGNOSIS — M9903 Segmental and somatic dysfunction of lumbar region: Secondary | ICD-10-CM | POA: Diagnosis not present

## 2021-12-14 DIAGNOSIS — S338XXA Sprain of other parts of lumbar spine and pelvis, initial encounter: Secondary | ICD-10-CM | POA: Diagnosis not present

## 2021-12-15 ENCOUNTER — Encounter (HOSPITAL_COMMUNITY): Payer: Self-pay

## 2021-12-15 ENCOUNTER — Ambulatory Visit (HOSPITAL_COMMUNITY): Payer: Medicare HMO

## 2021-12-15 DIAGNOSIS — M6281 Muscle weakness (generalized): Secondary | ICD-10-CM

## 2021-12-15 DIAGNOSIS — M5459 Other low back pain: Secondary | ICD-10-CM | POA: Diagnosis not present

## 2021-12-15 DIAGNOSIS — R2689 Other abnormalities of gait and mobility: Secondary | ICD-10-CM | POA: Diagnosis not present

## 2021-12-15 DIAGNOSIS — R29898 Other symptoms and signs involving the musculoskeletal system: Secondary | ICD-10-CM | POA: Diagnosis not present

## 2021-12-15 NOTE — Therapy (Signed)
OUTPATIENT PHYSICAL THERAPY TREATMENT   Patient Name: Jessica Pacheco MRN: [id number] DOB:February 03, 1976, 46 y.o., female Today's Date: 12/15/2021   PT End of Session - 12/15/21 1028     Visit Number 7    Number of Visits 12    Date for PT Re-Evaluation 12/27/21    Authorization Type [employer]    Progress Note Due on Visit 10    PT Start Time 1038    PT Stop Time 1116    PT Time Calculation (min) 38 min    Activity Tolerance Patient tolerated treatment well    Behavior During Therapy WFL for tasks assessed/performed              Past Medical History:  Diagnosis Date   Anxiety    Chronic right lower quadrant pain    DDD (degenerative disc disease), cervical    c6-7   Depression    Dyspnea    On exertion at times   GERD (gastroesophageal reflux disease)    WATCHES DIET   History of hypoglycemia    History of kidney stones    passed spontaneously   Hypertension    Hypothyroidism    OA (osteoarthritis)    knees , hips, feet   PONV (postoperative nausea and vomiting)    Rheumatoid arthritis(714.0) dx  2001  multiple sites   rheumotologist-  dr Herbie [place] gay w/ [facility]--- currently treated w/ oral arava and iv actemra   Shingles    SUI (stress urinary incontinence), female    Past Surgical History:  Procedure Laterality Date   COLONOSCOPY WITH PROPOFOL N/A 10/27/2021   Procedure: COLONOSCOPY WITH PROPOFOL;  Surgeon: Harvel Quale, MD;  Location: [facility];  Service: Gastroenterology;  Laterality: N/A;   DILATION AND CURETTAGE OF UTERUS     KNEE ARTHROSCOPY Right 1999   KNEE ARTHROSCOPY WITH MEDIAL MENISECTOMY Right 02/07/2018   Procedure: KNEE ARTHROSCOPY WITH PARTIAL LATERAL MENISECTOMY;  Surgeon: Carole Civil, MD;  Location: [facility];  Service: Orthopedics;  Laterality: Right;   LAPAROSCOPY N/A 02/16/2017   Procedure: LAPAROSCOPY DIAGNOSTIC;  Surgeon: Molli Posey, MD;  [facility];  Service: Gynecology;   Laterality: N/A;   TOTAL HIP ARTHROPLASTY Right 07/30/2020   Procedure: RIGHT TOTAL HIP ARTHROPLASTY ANTERIOR APPROACH;  Surgeon: Mcarthur Rossetti, MD;  Location: [facility];  Service: Orthopedics;  Laterality: Right;   WISDOM TOOTH EXTRACTION     Patient Active Problem List   Diagnosis Date Noted   Status post total replacement of right hip 07/30/2020   Hypertensive disorder 05/14/2019   Hypothyroidism 05/14/2019   Migraine 05/14/2019   Ovarian endometriosis 05/14/2019   S/P right knee arthroscopy 02/07/18 02/07/2018   Meniscus, lateral, derangement, right    Synovitis of right knee    Arthritis of right knee    Osteoarthritis, multiple sites 01/14/2018   Rheumatoid arthritis of multiple sites with negative rheumatoid factor ([place]) 05/08/2016   Primary osteoarthritis of both knees 08/30/2015   Primary osteoarthritis of right hip 08/30/2015   Primary osteoarthritis of left foot 06/30/2014   Change in stool 07/27/2011   Contraception 01/02/2011   Rheumatoid arthritis(714.0) 02/21/1999    PCP: Asencion Noble MD  REFERRING PROVIDER: Mcarthur Rossetti, MD  12/28/21, 12/21/21 MRI.    REFERRING DIAG: X83.382 (ICD-10-CM) - History of right hip replacement M54.41 (ICD-10-CM) - Acute right-sided low back pain with right-sided sciatica   Rationale for Evaluation and Treatment Rehabilitation  THERAPY DIAG:  Other low back pain  Muscle  weakness (generalized)  Other abnormalities of gait and mobility  Other symptoms and signs involving the musculoskeletal system  ONSET DATE: July 2023  SUBJECTIVE:                                                                                                                                                                                           SUBJECTIVE STATEMENT: Pt stated she had xray this Monday and MRI schedule 12/21/21.  Went to chiropractor yesterday, no alignment with lower back just dryneedling and adjustments to upper back.  No  reports of pain today, just some stiffness lower back.     **Pt is also seeing Chiropractor 3-4X a month  PERTINENT HISTORY:  RA, OA, HTN, obesity, sedentary lifestyle   PAIN:  Are you having pain? Yes: NPRS scale: 0/10 Pain location: low back and into Rt hip/LE Pain description: stiffness Aggravating factors: standing Relieving factors: rest   PRECAUTIONS: None  WEIGHT BEARING RESTRICTIONS No  FALLS:  Has patient fallen in last 6 months? No  LIVING ENVIRONMENT: Lives with: lives with their spouse Lives in: House/apartment Stairs: Yes: External: 3 steps; none Has following equipment at home: Single point cane, Walker - 2 wheeled, and bed side commode  OCCUPATION: disability  PLOF: Independent  PATIENT GOALS pain reduction   OBJECTIVE:   DIAGNOSTIC FINDINGS:  XR 10/12/21 An [facility] pelvis and lateral of the right hip shows a well-seated total hip arthroplasty with no complicating features.  PATIENT SURVEYS:  FOTO 47% function  SCREENING FOR RED FLAGS: Bowel or bladder incontinence: No Spinal tumors: No Cauda equina syndrome: No Compression fracture: No Abdominal aneurysm: No  COGNITION:  Overall cognitive status: Within functional limits for tasks assessed     SENSATION: WFL   POSTURE: rounded shoulders and forward head  PALPATION: TTP lower R lumbar paraspinals, glutes  LUMBAR ROM:   Active  A/PROM  eval  Flexion 25% limited *  Extension 50% limited*  Right lateral flexion 0% limited *  Left lateral flexion 0% limited  Right rotation 25% limited *  Left rotation 25% limited    (Blank rows = not tested) *= pain  LOWER EXTREMITY ROM:   WFL for tasks assessed  LOWER EXTREMITY MMT:    MMT Right eval Left eval  Hip flexion 4+* 5  Hip extension 4* 4  Hip abduction 4* 4+  Hip adduction    Hip internal rotation    Hip external rotation    Knee flexion 5 5  Knee extension 5 5  Ankle dorsiflexion 5 5  Ankle plantarflexion    Ankle inversion     Ankle eversion     (  Blank rows = not tested)   FUNCTIONAL TESTS:  5 times sit to stand: 20.31 seconds, hands on thighs, slight reliance on LLE>RLE 2 minute walk test: 310 feet Transfers: labored, pain with transfer  GAIT: Distance walked: 310 feet Assistive device utilized: None Level of assistance: Complete Independence Comments: 2MWT, gradual increase in symptoms beginning at 1 minute with gradual worsening Mild antalgic with gradual worsening, mild Trendelenburg with worsening after 1 minute    TODAY'S TREATMENT  12/15/21   3D hip excursions   Vector stance 3x 5" with 1 HHA   Marching with UE/opposite LE with ab set 10x 3" holds   BTB pallof Tandem stance 20x x2   BTB shoulder extension paired with marching 10x 5"   12/13/21 3D hip excursions SKTC 3X30" LTR with red physioball 10X each Bridge with red physioball 10X each SLR 10x each with stab  12/07/21: Heel raise then squat  3D hip excursion (arm reach over with weight shifting, rotation with hands on hips, squat then extension)   Marching with UE/opposite LE with ab set   BTB pallof NBOS 20x x2   BTB shoulder extension paired with marching 10x 5"    Seated QL stretch with theraball 5x10"   Supine: SKTC 2x 30"  12/01/21 3D hip excursion 10x (weight shifting, rotation, squat then extension) Marching with UE/opposite LE with ab set   Pallof NBOS 2x 10 bilateral GTB   Tandem stance 2x 30"   Quadruped: cat/camel 10x 10"     Hip extension 10x     UE/LE 5x   Prone: 1 min  11/29/21 Press up 1x 10  Standing lumbar extension/hip dip at counter 1 x 10  March with ab set 3x 8 alternating Bridge with clamshell GTB 2x 10  DKTC with heels on green ball 2x 10 5 second holds SLR 2 x 10 bilateral  Lateral stepping GTB at knees 4x 10 feet bilateral  STS 1x 10  Palof 2x 10 bilateral GTB  11/16/21 Bridge 2x 10 Ab set 10x 5 second holds March with ab set 3x 8 alternating POE 1 minute Press up 1x 10  Prone hip  extension 2 x 10 bilateral  LTR 10 x 5 second holds bilateral  Sidelying hip abduction 2x 10 bilateral  STS 1x 10   11/15/21 Bridge 2x 10 Ab set 10x 5 second holds   PATIENT EDUCATION:  Education details: 11/16/21: HEP;  Eval: Patient educated on exam findings, POC, scope of PT, HEP, and bed mobility. Person educated: Patient Education method: Explanation, Demonstration, and Handouts Education comprehension: verbalized understanding, returned demonstration, verbal cues required, and tactile cues required  HOME EXERCISE PROGRAM: Access Code: [id number] 12/07/21: SKTC, QL stretch with theraball  11/29/21 - Bridge with Hip Abduction and Resistance - Ground Touches  - 1 x daily - 7 x weekly - 2 sets - 10 reps - Supine Hamstring Swiss Ball Curls/Dynamic Mobilization  - 1 x daily - 7 x weekly - 2 sets - 10 reps - 5 second hold - Active Straight Leg Raise with Quad Set  - 1 x daily - 7 x weekly - 2 sets - 10 reps - Side Stepping with Resistance at Thighs  - 1 x daily - 7 x weekly - 4 sets - 10 reps  11/16/21 - Supine March with Posterior Pelvic Tilt  - 1 x daily - 7 x weekly - 2 sets - 10 reps - Prone Press Up  - 2-3 x daily - 7 x weekly -  2 sets - 10 reps - Prone Hip Extension  - 1 x daily - 7 x weekly - 2 sets - 10 reps - Supine Lower Trunk Rotation  - 1 x daily - 7 x weekly - 10 reps - 5 second hold - Sidelying Hip Abduction  - 1 x daily - 7 x weekly - 2 sets - 10 reps - Sit to Stand  - 1 x daily - 7 x weekly - 2 sets - 10 reps  Date: 11/15/2021 - Supine Bridge  - 2-3 x daily - 7 x weekly - 2 sets - 10 reps - Abdominal Bracing  - 2-3 x daily - 7 x weekly - 2 sets - 10 reps - 5 second hold  ASSESSMENT:  CLINICAL IMPRESSION: Session focus with lumbar mobility and core/proximal strengthening.  Progressed core stability exercises with additional vector stance and progressed balance with tandem stance during paloff exercises.  Pt tolerated well with no reports of increased pain through  session.    OBJECTIVE IMPAIRMENTS Abnormal gait, decreased activity tolerance, decreased balance, decreased endurance, decreased mobility, difficulty walking, decreased ROM, decreased strength, hypomobility, increased muscle spasms, impaired flexibility, improper body mechanics, postural dysfunction, and pain.   ACTIVITY LIMITATIONS carrying, lifting, bending, standing, squatting, stairs, transfers, locomotion level, and caring for others  PARTICIPATION LIMITATIONS: meal prep, cleaning, laundry, shopping, community activity, and yard work  [place], Past/current experiences, Time since onset of injury/illness/exacerbation, and 3+ comorbidities: RA, OA, HTN, obesity  are also affecting patient's functional outcome.   REHAB POTENTIAL: Good  CLINICAL DECISION MAKING: Stable/uncomplicated  EVALUATION COMPLEXITY: Low   GOALS: Goals reviewed with patient? Yes  SHORT TERM GOALS: Target date: 12/06/2021  Patient will be independent with HEP in order to improve functional outcomes. Baseline:  Goal status: IN PROGRESS  2.  Patient will report at least 25% improvement in symptoms for improved quality of life. Baseline:  Goal status: IN PROGRESS    LONG TERM GOALS: Target date: 12/27/2021  Patient will report at least 75% improvement in symptoms for improved quality of life. Baseline:  Goal status: IN PROGRESS  2.  Patient will improve FOTO score by at least 17 points in order to indicate improved tolerance to activity. Baseline: 47% function Goal status: IN PROGRESS  3.  Patient will demonstrate at least 25% improvement in lumbar ROM in all restricted planes for improved ability to move trunk while completing chores. Baseline: see AROM Goal status: IN PROGRESS  4.  Patient will be able to ambulate at least 400 feet in 2MWT in order to demonstrate improved tolerance to activity. Baseline: 310 feet Goal status: IN PROGRESS  5.  Patient will demonstrate grade of 5/5  MMT grade in all tested musculature as evidence of improved strength to assist with stair ambulation and gait.   Baseline: see MMT Goal status: IN PROGRESS  6.  Patient will be able to complete 5x STS in under 11.4 seconds in order to reduce the risk of falls. Baseline: 20.31 seconds Goal status: IN PROGRESS    PLAN: PT FREQUENCY: 2x/week  PT DURATION: 6 weeks  PLANNED INTERVENTIONS: Therapeutic exercises, Therapeutic activity, Neuromuscular re-education, Balance training, Gait training, Patient/Family education, Joint manipulation, Joint mobilization, Stair training, Orthotic/Fit training, DME instructions, Aquatic Therapy, Dry Needling, Electrical stimulation, Spinal manipulation, Spinal mobilization, Cryotherapy, Moist heat, Compression bandaging, scar mobilization, Splintting, Taping, Traction, Ultrasound, Ionotophoresis 4mg /ml Dexamethasone, and Manual therapy  PLAN FOR NEXT SESSION: glute and core strength, f/u with HEP and progress as able, functional  strength.     Ihor Austin, LPTA/CLT; CBIS [phone]  Aldona Lento, PTA 12/15/2021, 12:06 PM

## 2021-12-20 ENCOUNTER — Ambulatory Visit (HOSPITAL_COMMUNITY): Payer: Medicare HMO

## 2021-12-20 DIAGNOSIS — R29898 Other symptoms and signs involving the musculoskeletal system: Secondary | ICD-10-CM

## 2021-12-20 DIAGNOSIS — M5459 Other low back pain: Secondary | ICD-10-CM | POA: Diagnosis not present

## 2021-12-20 DIAGNOSIS — M6281 Muscle weakness (generalized): Secondary | ICD-10-CM | POA: Diagnosis not present

## 2021-12-20 DIAGNOSIS — R2689 Other abnormalities of gait and mobility: Secondary | ICD-10-CM

## 2021-12-20 NOTE — Therapy (Signed)
OUTPATIENT PHYSICAL THERAPY TREATMENT   Patient Name: Jessica Pacheco MRN: [id number] DOB:[birth year], 46 y.o., female Today's Date: 12/20/2021   PT End of Session - 12/20/21 0903     Visit Number 8    Number of Visits 12    Date for PT Re-Evaluation 12/27/21    Authorization Type [employer]    Progress Note Due on Visit 10    PT Start Time 0902    PT Stop Time 0943    PT Time Calculation (min) 41 min    Activity Tolerance Patient tolerated treatment well    Behavior During Therapy [facility] for tasks assessed/performed               Past Medical History:  Diagnosis Date   Anxiety    Chronic right lower quadrant pain    DDD (degenerative disc disease), cervical    c6-7   Depression    Dyspnea    On exertion at times   GERD (gastroesophageal reflux disease)    WATCHES DIET   History of hypoglycemia    History of kidney stones    passed spontaneously   Hypertension    Hypothyroidism    OA (osteoarthritis)    knees , hips, feet   PONV (postoperative nausea and vomiting)    Rheumatoid arthritis(714.0) dx  2001  multiple sites   rheumotologist-  dr Herbie [place] gay w/ [facility]--- currently treated w/ oral arava and iv actemra   Shingles    SUI (stress urinary incontinence), female    Past Surgical History:  Procedure Laterality Date   COLONOSCOPY WITH PROPOFOL N/A 10/27/2021   Procedure: COLONOSCOPY WITH PROPOFOL;  Surgeon: Harvel Quale, MD;  Location: [facility];  Service: Gastroenterology;  Laterality: N/A;   DILATION AND CURETTAGE OF UTERUS     KNEE ARTHROSCOPY Right 1999   KNEE ARTHROSCOPY WITH MEDIAL MENISECTOMY Right 02/07/2018   Procedure: KNEE ARTHROSCOPY WITH PARTIAL LATERAL MENISECTOMY;  Surgeon: Carole Civil, MD;  Location: [facility];  Service: Orthopedics;  Laterality: Right;   LAPAROSCOPY N/A 02/16/2017   Procedure: LAPAROSCOPY DIAGNOSTIC;  Surgeon: Molli Posey, MD;  Location[facility];  Service: Gynecology;   Laterality: N/A;   TOTAL HIP ARTHROPLASTY Right 07/30/2020   Procedure: RIGHT TOTAL HIP ARTHROPLASTY ANTERIOR APPROACH;  Surgeon: Mcarthur Rossetti, MD;  Location: [facility];  Service: Orthopedics;  Laterality: Right;   WISDOM TOOTH EXTRACTION     Patient Active Problem List   Diagnosis Date Noted   Status post total replacement of right hip 07/30/2020   Hypertensive disorder 05/14/2019   Hypothyroidism 05/14/2019   Migraine 05/14/2019   Ovarian endometriosis 05/14/2019   S/P right knee arthroscopy 02/07/18 02/07/2018   Meniscus, lateral, derangement, right    Synovitis of right knee    Arthritis of right knee    Osteoarthritis, multiple sites 01/14/2018   Rheumatoid arthritis of multiple sites with negative rheumatoid factor ([place]) 05/08/2016   Primary osteoarthritis of both knees 08/30/2015   Primary osteoarthritis of right hip 08/30/2015   Primary osteoarthritis of left foot 06/30/2014   Change in stool 07/27/2011   Contraception 01/02/2011   Rheumatoid arthritis(714.0) 02/21/1999    PCP: Asencion Noble MD  REFERRING PROVIDER: Mcarthur Rossetti, MD  12/28/21, 12/21/21 MRI.    REFERRING DIAG: [id number] (ICD-10-CM) - History of right hip replacement M54.41 (ICD-10-CM) - Acute right-sided low back pain with right-sided sciatica   Rationale for Evaluation and Treatment Rehabilitation  THERAPY DIAG:  Other low back pain  Muscle weakness (generalized)  Other abnormalities of gait and mobility  Other symptoms and signs involving the musculoskeletal system  ONSET DATE: July 2023  SUBJECTIVE:                                                                                                                                                                                           SUBJECTIVE STATEMENT: Patient had x-ray Monday; noted L3 slipped forward on L4 MRI schedule 12/21/21.  Some discomfort today on PT arrival.      [name] on 12/26/21   **Pt is also seeing  Chiropractor 3-4X a month  PERTINENT HISTORY:  RA, OA, HTN, obesity, sedentary lifestyle   PAIN:  Are you having pain? Yes: NPRS scale: 0/10 Pain location: low back and into Rt hip/LE Pain description: stiffness Aggravating factors: standing Relieving factors: rest   PRECAUTIONS: None  WEIGHT BEARING RESTRICTIONS No  FALLS:  Has patient fallen in last 6 months? No  LIVING ENVIRONMENT: Lives with: lives with their spouse Lives in: House/apartment Stairs: Yes: External: 3 steps; none Has following equipment at home: Single point cane, Walker - 2 wheeled, and bed side commode  OCCUPATION: disability  PLOF: Independent  PATIENT GOALS pain reduction   OBJECTIVE:   DIAGNOSTIC FINDINGS:  XR 10/12/21 An [facility] pelvis and lateral of the right hip shows a well-seated total hip arthroplasty with no complicating features.  PATIENT SURVEYS:  FOTO 47% function  SCREENING FOR RED FLAGS: Bowel or bladder incontinence: No Spinal tumors: No Cauda equina syndrome: No Compression fracture: No Abdominal aneurysm: No  COGNITION:  Overall cognitive status: Within functional limits for tasks assessed     SENSATION: WFL   POSTURE: rounded shoulders and forward head  PALPATION: TTP lower R lumbar paraspinals, glutes  LUMBAR ROM:   Active  A/PROM  eval  Flexion 25% limited *  Extension 50% limited*  Right lateral flexion 0% limited *  Left lateral flexion 0% limited  Right rotation 25% limited *  Left rotation 25% limited    (Blank rows = not tested) *= pain  LOWER EXTREMITY ROM:   WFL for tasks assessed  LOWER EXTREMITY MMT:    MMT Right eval Left eval  Hip flexion 4+* 5  Hip extension 4* 4  Hip abduction 4* 4+  Hip adduction    Hip internal rotation    Hip external rotation    Knee flexion 5 5  Knee extension 5 5  Ankle dorsiflexion 5 5  Ankle plantarflexion    Ankle inversion    Ankle eversion     (Blank rows = not tested)   FUNCTIONAL TESTS:  5  times sit to stand: 20.31 seconds, hands on thighs, slight reliance on LLE>RLE 2 minute walk test: 310 feet Transfers: labored, pain with transfer  GAIT: Distance walked: 310 feet Assistive device utilized: None Level of assistance: Complete Independence Comments: 2MWT, gradual increase in symptoms beginning at 1 minute with gradual worsening Mild antalgic with gradual worsening, mild Trendelenburg with worsening after 1 minute    TODAY'S TREATMENT  12/20/21 Review of x-ray findings L3 significantly forward on L4 Supine: LTR x 20 SKTC 5 x 20" TA contraction 5" hold x 10  TA contraction with Hip adduction 5" hold x 10 TA contraction with Hip abduction 5" hold x 10 Piriformis stretch with towel 5 x 20" DKTC 5 x 20"  Seated lumbar flexion x 10    12/15/21   3D hip excursions   Vector stance 3x 5" with 1 HHA   Marching with UE/opposite LE with ab set 10x 3" holds   BTB pallof Tandem stance 20x x2   BTB shoulder extension paired with marching 10x 5"   12/13/21 3D hip excursions SKTC 3X30" LTR with red physioball 10X each Bridge with red physioball 10X each SLR 10x each with stab  12/07/21: Heel raise then squat  3D hip excursion (arm reach over with weight shifting, rotation with hands on hips, squat then extension)   Marching with UE/opposite LE with ab set   BTB pallof NBOS 20x x2   BTB shoulder extension paired with marching 10x 5"    Seated QL stretch with theraball 5x10"   Supine: SKTC 2x 30"  12/01/21 3D hip excursion 10x (weight shifting, rotation, squat then extension) Marching with UE/opposite LE with ab set   Pallof NBOS 2x 10 bilateral GTB   Tandem stance 2x 30"   Quadruped: cat/camel 10x 10"     Hip extension 10x     UE/LE 5x   Prone: 1 min  11/29/21 Press up 1x 10  Standing lumbar extension/hip dip at counter 1 x 10  March with ab set 3x 8 alternating Bridge with clamshell GTB 2x 10  DKTC with heels on green ball 2x 10 5 second holds SLR 2  x 10 bilateral  Lateral stepping GTB at knees 4x 10 feet bilateral  STS 1x 10  Palof 2x 10 bilateral GTB  11/16/21 Bridge 2x 10 Ab set 10x 5 second holds March with ab set 3x 8 alternating POE 1 minute Press up 1x 10  Prone hip extension 2 x 10 bilateral  LTR 10 x 5 second holds bilateral  Sidelying hip abduction 2x 10 bilateral  STS 1x 10   11/15/21 Bridge 2x 10 Ab set 10x 5 second holds   PATIENT EDUCATION:  Education details: 11/16/21: HEP;  Eval: Patient educated on exam findings, POC, scope of PT, HEP, and bed mobility. Person educated: Patient Education method: Explanation, Demonstration, and Handouts Education comprehension: verbalized understanding, returned demonstration, verbal cues required, and tactile cues required  HOME EXERCISE PROGRAM: Access Code: [email or web address] 12/07/21: SKTC, QL stretch with theraball  11/29/21 - Bridge with Hip Abduction and Resistance - Ground Touches  - 1 x daily - 7 x weekly - 2 sets - 10 reps - Supine Hamstring Swiss Ball Curls/Dynamic Mobilization  - 1 x daily - 7 x weekly - 2 sets - 10 reps - 5 second hold - Active Straight Leg Raise with Quad Set  - 1 x daily - 7 x weekly - 2 sets - 10 reps - Side Stepping with Resistance at  Thighs  - 1 x daily - 7 x weekly - 4 sets - 10 reps  11/16/21 - Supine March with Posterior Pelvic Tilt  - 1 x daily - 7 x weekly - 2 sets - 10 reps - Prone Press Up  - 2-3 x daily - 7 x weekly - 2 sets - 10 reps - Prone Hip Extension  - 1 x daily - 7 x weekly - 2 sets - 10 reps - Supine Lower Trunk Rotation  - 1 x daily - 7 x weekly - 10 reps - 5 second hold - Sidelying Hip Abduction  - 1 x daily - 7 x weekly - 2 sets - 10 reps - Sit to Stand  - 1 x daily - 7 x weekly - 2 sets - 10 reps  Date: 11/15/2021 - Supine Bridge  - 2-3 x daily - 7 x weekly - 2 sets - 10 reps - Abdominal Bracing  - 2-3 x daily - 7 x weekly - 2 sets - 10 reps - 5 second hold  ASSESSMENT:  CLINICAL IMPRESSION: Focused on flexion based  exercise today due to x-ray showing forward shift of L3 to L4.  Added flexion based exercise today without issue.  Patient reports general soreness in the back and hip only.  Pt tolerated well with no reports of increased pain through session.    OBJECTIVE IMPAIRMENTS Abnormal gait, decreased activity tolerance, decreased balance, decreased endurance, decreased mobility, difficulty walking, decreased ROM, decreased strength, hypomobility, increased muscle spasms, impaired flexibility, improper body mechanics, postural dysfunction, and pain.   ACTIVITY LIMITATIONS carrying, lifting, bending, standing, squatting, stairs, transfers, locomotion level, and caring for others  PARTICIPATION LIMITATIONS: meal prep, cleaning, laundry, shopping, community activity, and yard work  [place], Past/current experiences, Time since onset of injury/illness/exacerbation, and 3+ comorbidities: RA, OA, HTN, obesity  are also affecting patient's functional outcome.   REHAB POTENTIAL: Good  CLINICAL DECISION MAKING: Stable/uncomplicated  EVALUATION COMPLEXITY: Low   GOALS: Goals reviewed with patient? Yes  SHORT TERM GOALS: Target date: 12/06/2021  Patient will be independent with HEP in order to improve functional outcomes. Baseline:  Goal status: IN PROGRESS  2.  Patient will report at least 25% improvement in symptoms for improved quality of life. Baseline:  Goal status: IN PROGRESS    LONG TERM GOALS: Target date: 12/27/2021  Patient will report at least 75% improvement in symptoms for improved quality of life. Baseline:  Goal status: IN PROGRESS  2.  Patient will improve FOTO score by at least 17 points in order to indicate improved tolerance to activity. Baseline: 47% function Goal status: IN PROGRESS  3.  Patient will demonstrate at least 25% improvement in lumbar ROM in all restricted planes for improved ability to move trunk while completing chores. Baseline: see  AROM Goal status: IN PROGRESS  4.  Patient will be able to ambulate at least 400 feet in 2MWT in order to demonstrate improved tolerance to activity. Baseline: 310 feet Goal status: IN PROGRESS  5.  Patient will demonstrate grade of 5/5 MMT grade in all tested musculature as evidence of improved strength to assist with stair ambulation and gait.   Baseline: see MMT Goal status: IN PROGRESS  6.  Patient will be able to complete 5x STS in under 11.4 seconds in order to reduce the risk of falls. Baseline: 20.31 seconds Goal status: IN PROGRESS    PLAN: PT FREQUENCY: 2x/week  PT DURATION: 6 weeks  PLANNED INTERVENTIONS: Therapeutic exercises, Therapeutic  activity, Neuromuscular re-education, Balance training, Gait training, Patient/Family education, Joint manipulation, Joint mobilization, Stair training, Orthotic/Fit training, DME instructions, Aquatic Therapy, Dry Needling, Electrical stimulation, Spinal manipulation, Spinal mobilization, Cryotherapy, Moist heat, Compression bandaging, scar mobilization, Splintting, Taping, Traction, Ultrasound, Ionotophoresis 4mg /ml Dexamethasone, and Manual therapy  PLAN FOR NEXT SESSION: glute and core strength, f/u with HEP and progress as able, functional strength. Focus on Flexion based activity; having MRI tomorrow   9:43 AM, 12/20/21 Bevely Hackbart Small Ajeenah Heiny MPT  physical therapy [place] [phone]

## 2021-12-21 ENCOUNTER — Ambulatory Visit (HOSPITAL_COMMUNITY)
Admission: RE | Admit: 2021-12-21 | Discharge: 2021-12-21 | Disposition: A | Discharge status: Home / Self Care | Payer: Medicare HMO | Source: Ambulatory Visit | Attending: Orthopaedic Surgery | Admitting: Orthopaedic Surgery

## 2021-12-21 DIAGNOSIS — M545 Low back pain, unspecified: Secondary | ICD-10-CM | POA: Diagnosis not present

## 2021-12-21 DIAGNOSIS — M5126 Other intervertebral disc displacement, lumbar region: Secondary | ICD-10-CM | POA: Diagnosis not present

## 2021-12-21 DIAGNOSIS — G8929 Other chronic pain: Secondary | ICD-10-CM | POA: Insufficient documentation

## 2021-12-21 DIAGNOSIS — M5441 Lumbago with sciatica, right side: Secondary | ICD-10-CM | POA: Insufficient documentation

## 2021-12-22 ENCOUNTER — Encounter (HOSPITAL_COMMUNITY): Payer: Medicare HMO

## 2021-12-27 ENCOUNTER — Ambulatory Visit (HOSPITAL_COMMUNITY): Payer: Medicare HMO | Attending: Orthopaedic Surgery | Admitting: Physical Therapy

## 2021-12-27 ENCOUNTER — Encounter (HOSPITAL_COMMUNITY): Payer: Self-pay | Admitting: Physical Therapy

## 2021-12-27 DIAGNOSIS — R2689 Other abnormalities of gait and mobility: Secondary | ICD-10-CM | POA: Insufficient documentation

## 2021-12-27 DIAGNOSIS — M6281 Muscle weakness (generalized): Secondary | ICD-10-CM | POA: Insufficient documentation

## 2021-12-27 DIAGNOSIS — M5459 Other low back pain: Secondary | ICD-10-CM | POA: Diagnosis not present

## 2021-12-27 DIAGNOSIS — R29898 Other symptoms and signs involving the musculoskeletal system: Secondary | ICD-10-CM | POA: Insufficient documentation

## 2021-12-27 NOTE — Therapy (Signed)
OUTPATIENT PHYSICAL THERAPY TREATMENT   Patient Name: Jessica Pacheco MRN: [id number] DOB:June 01, 1975, 46 y.o., female Today's Date: 12/27/2021  Progress Note   Reporting Period 11/15/21 to 12/27/21   See note below for Objective Data and Assessment of Progress/Goals    PT End of Session - 12/27/21 0945     Visit Number 9    Number of Visits 18    Date for PT Re-Evaluation 02/07/22    Authorization Type [employer]    Progress Note Due on Visit 78    PT Start Time 0945    PT Stop Time 1025    PT Time Calculation (min) 40 min    Activity Tolerance Patient tolerated treatment well    Behavior During Therapy WFL for tasks assessed/performed               Past Medical History:  Diagnosis Date   Anxiety    Chronic right lower quadrant pain    DDD (degenerative disc disease), cervical    c6-7   Depression    Dyspnea    On exertion at times   GERD (gastroesophageal reflux disease)    WATCHES DIET   History of hypoglycemia    History of kidney stones    passed spontaneously   Hypertension    Hypothyroidism    OA (osteoarthritis)    knees , hips, feet   PONV (postoperative nausea and vomiting)    Rheumatoid arthritis(714.0) dx  2001  multiple sites   rheumotologist-  dr Herbie [place] gay w/ [facility]--- currently treated w/ oral arava and iv actemra   Shingles    SUI (stress urinary incontinence), female    Past Surgical History:  Procedure Laterality Date   COLONOSCOPY WITH PROPOFOL N/A 10/27/2021   Procedure: COLONOSCOPY WITH PROPOFOL;  Surgeon: Harvel Quale, MD;  Location: [facility];  Service: Gastroenterology;  Laterality: N/A;   DILATION AND CURETTAGE OF UTERUS     KNEE ARTHROSCOPY Right 1999   KNEE ARTHROSCOPY WITH MEDIAL MENISECTOMY Right 02/07/2018   Procedure: KNEE ARTHROSCOPY WITH PARTIAL LATERAL MENISECTOMY;  Surgeon: Carole Civil, MD;  Location: [facility];  Service: Orthopedics;  Laterality: Right;   LAPAROSCOPY N/A 02/16/2017    Procedure: LAPAROSCOPY DIAGNOSTIC;  Surgeon: Molli Posey, MD;  [facility];  Service: Gynecology;  Laterality: N/A;   TOTAL HIP ARTHROPLASTY Right 07/30/2020   Procedure: RIGHT TOTAL HIP ARTHROPLASTY ANTERIOR APPROACH;  Surgeon: Mcarthur Rossetti, MD;  Location: [facility];  Service: Orthopedics;  Laterality: Right;   WISDOM TOOTH EXTRACTION     Patient Active Problem List   Diagnosis Date Noted   Status post total replacement of right hip 07/30/2020   Hypertensive disorder 05/14/2019   Hypothyroidism 05/14/2019   Migraine 05/14/2019   Ovarian endometriosis 05/14/2019   S/P right knee arthroscopy 02/07/18 02/07/2018   Meniscus, lateral, derangement, right    Synovitis of right knee    Arthritis of right knee    Osteoarthritis, multiple sites 01/14/2018   Rheumatoid arthritis of multiple sites with negative rheumatoid factor ([place]) 05/08/2016   Primary osteoarthritis of both knees 08/30/2015   Primary osteoarthritis of right hip 08/30/2015   Primary osteoarthritis of left foot 06/30/2014   Change in stool 07/27/2011   Contraception 01/02/2011   Rheumatoid arthritis(714.0) 02/21/1999    PCP: Asencion Noble MD  REFERRING PROVIDER: Mcarthur Rossetti, MD  12/28/21, 12/21/21 MRI.    REFERRING DIAG: J50.093 (ICD-10-CM) - History of right hip replacement M54.41 (ICD-10-CM) - Acute right-sided  low back pain with right-sided sciatica   Rationale for Evaluation and Treatment Rehabilitation  THERAPY DIAG:  Other low back pain  Muscle weakness (generalized)  Other abnormalities of gait and mobility  Other symptoms and signs involving the musculoskeletal system  ONSET DATE: July 2023  SUBJECTIVE:                                                                                                                                                                                           SUBJECTIVE STATEMENT: Patient is more stiff because she is not on her  Celebrex anymore since her creatinine was too high. Voleran cream doesn't do a whole lot. Goes back to Dr. Ninfa Linden tomorrow. They will refer her to spinal doc. Been doing quite a bit of walking. Patient states she has been doing exercises most days. Patient states 80-85% improvement since beginning PT. She has soreness at times, no issues with sitting still and not twisting. Certain times things hurt worse. Has seen an improvement.   **Pt is also seeing Chiropractor 3-4X a month  PERTINENT HISTORY:  RA, OA, HTN, obesity, sedentary lifestyle   PAIN:  Are you having pain? Yes: NPRS scale: 0/10 Pain location: low back and into Rt hip/LE Pain description: stiffness Aggravating factors: standing Relieving factors: rest   PRECAUTIONS: None  WEIGHT BEARING RESTRICTIONS No  FALLS:  Has patient fallen in last 6 months? No  LIVING ENVIRONMENT: Lives with: lives with their spouse Lives in: House/apartment Stairs: Yes: External: 3 steps; none Has following equipment at home: Single point cane, Walker - 2 wheeled, and bed side commode  OCCUPATION: disability  PLOF: Independent  PATIENT GOALS pain reduction   OBJECTIVE: (measures from initial evaluation unless otherwise dated)  DIAGNOSTIC FINDINGS:  XR 10/12/21 An [facility] pelvis and lateral of the right hip shows a well-seated total hip arthroplasty with no complicating features.  PATIENT SURVEYS:  FOTO 47% function  12/27/21: 50% function  SCREENING FOR RED FLAGS: Bowel or bladder incontinence: No Spinal tumors: No Cauda equina syndrome: No Compression fracture: No Abdominal aneurysm: No  COGNITION:  Overall cognitive status: Within functional limits for tasks assessed     SENSATION: WFL   POSTURE: rounded shoulders and forward head  PALPATION: TTP lower R lumbar paraspinals, glutes  LUMBAR ROM:   Active  A/PROM  eval AROM 12/27/21  Flexion 25% limited * 0% limited  Extension 50% limited* 0% limited  Right lateral  flexion 0% limited * 0% limited *  Left lateral flexion 0% limited 0% limited  Right rotation 25% limited * 0% limited  Left rotation 25% limited  0% limited   (  Blank rows = not tested) *= pain  LOWER EXTREMITY ROM:   WFL for tasks assessed  LOWER EXTREMITY MMT:    MMT Right eval Left eval Right 12/27/21 Left  12/27/21  Hip flexion 4+* _0 Hip extension 4* _1 Hip abduction 4* 4+ 5 5  Hip adduction      Hip internal rotation      Hip external rotation      Knee flexion 5 5    Knee extension 5 5    Ankle dorsiflexion 5 5    Ankle plantarflexion      Ankle inversion      Ankle eversion       (Blank rows = not tested)   FUNCTIONAL TESTS:  5 times sit to stand: 20.31 seconds, hands on thighs, slight reliance on LLE>RLE 2 minute walk test: 310 feet Transfers: labored, pain with transfer  GAIT: Distance walked: 310 feet Assistive device utilized: None Level of assistance: Complete Independence Comments: 2MWT, gradual increase in symptoms beginning at 1 minute with gradual worsening Mild antalgic with gradual worsening, mild Trendelenburg with worsening after 1 minute  Reassessment 12/27/21 5 times sit to stand: 11.04 seconds, hands on thighs 2 minute walk test: 465fet Transfers: labored, pain with transfer  GAIT: Distance walked: 405 feet Assistive device utilized: None Level of assistance: Complete Independence Comments: 2MWT, minimal symptoms  TODAY'S TREATMENT  12/27/21 Reassessment POC discussion  12/20/21 Review of x-ray findings L3 significantly forward on L4 Supine: LTR x 20 SKTC 5 x 20" TA contraction 5" hold x 10  TA contraction with Hip adduction 5" hold x 10 TA contraction with Hip abduction 5" hold x 10 Piriformis stretch with towel 5 x 20" DKTC 5 x 20"  Seated lumbar flexion x 10    12/15/21   3D hip excursions   Vector stance 3x 5" with 1 HHA   Marching with UE/opposite LE with ab set 10x 3" holds   BTB pallof Tandem stance 20x  x2   BTB shoulder extension paired with marching 10x 5"   12/13/21 3D hip excursions SKTC 3X30" LTR with red physioball 10X each Bridge with red physioball 10X each SLR 10x each with stab  12/07/21: Heel raise then squat  3D hip excursion (arm reach over with weight shifting, rotation with hands on hips, squat then extension)   Marching with UE/opposite LE with ab set   BTB pallof NBOS 20x x2   BTB shoulder extension paired with marching 10x 5"    Seated QL stretch with theraball 5x10"   Supine: SKTC 2x 30"  12/01/21 3D hip excursion 10x (weight shifting, rotation, squat then extension) Marching with UE/opposite LE with ab set   Pallof NBOS 2x 10 bilateral GTB   Tandem stance 2x 30"   Quadruped: cat/camel 10x 10"     Hip extension 10x     UE/LE 5x   Prone: 1 min  11/29/21 Press up 1x 10  Standing lumbar extension/hip dip at counter 1 x 10  March with ab set 3x 8 alternating Bridge with clamshell GTB 2x 10  DKTC with heels on green ball 2x 10 5 second holds SLR 2 x 10 bilateral  Lateral stepping GTB at knees 4x 10 feet bilateral  STS 1x 10  Palof 2x 10 bilateral GTB   PATIENT EDUCATION:  Education details: 12/27/21: Reassessment findings, POC, core strength; 11/16/21: HEP;  Eval: Patient educated on exam findings, POC, scope of PT, HEP, and bed  mobility. Person educated: Patient Education method: Explanation, Demonstration, and Handouts Education comprehension: verbalized understanding, returned demonstration, verbal cues required, and tactile cues required  HOME EXERCISE PROGRAM: Access Code: [email or web address] 12/07/21: SKTC, QL stretch with theraball  11/29/21 - Bridge with Hip Abduction and Resistance - Ground Touches  - 1 x daily - 7 x weekly - 2 sets - 10 reps - Supine Hamstring Swiss Ball Curls/Dynamic Mobilization  - 1 x daily - 7 x weekly - 2 sets - 10 reps - 5 second hold - Active Straight Leg Raise with Quad Set  - 1 x daily - 7 x weekly - 2 sets - 10 reps -  Side Stepping with Resistance at Thighs  - 1 x daily - 7 x weekly - 4 sets - 10 reps  11/16/21 - Supine March with Posterior Pelvic Tilt  - 1 x daily - 7 x weekly - 2 sets - 10 reps - Prone Press Up  - 2-3 x daily - 7 x weekly - 2 sets - 10 reps - Prone Hip Extension  - 1 x daily - 7 x weekly - 2 sets - 10 reps - Supine Lower Trunk Rotation  - 1 x daily - 7 x weekly - 10 reps - 5 second hold - Sidelying Hip Abduction  - 1 x daily - 7 x weekly - 2 sets - 10 reps - Sit to Stand  - 1 x daily - 7 x weekly - 2 sets - 10 reps  Date: 11/15/2021 - Supine Bridge  - 2-3 x daily - 7 x weekly - 2 sets - 10 reps - Abdominal Bracing  - 2-3 x daily - 7 x weekly - 2 sets - 10 reps - 5 second hold  ASSESSMENT:  CLINICAL IMPRESSION: Patient has met 2/2 short term goals and 5/6 long term goals with ability to complete HEP and improvement in symptoms, strength, ROM, activity tolerance, gait, and functional mobility. Remaining goals not met due to continued deficits in activity tolerance. Patient has made good progress toward remaining goal. Patient with continued intermittent symptoms leading to participation restrictions. Patient with recent lumbar MRI on 12/21/21 with impression " Grade 1 anterolisthesis at L3-L4 due to bilateral L3 pars defects. Mild right lateral recess and bilateral neuroforaminal stenosis at this level." Patient will f/u with MD regarding this for furhter referral for management. Patient will continue PT at this time to focus on additional core strengthening and glute strength. Patient will continue to benefit from skilled physical therapy in order to improve function and reduce impairment.     OBJECTIVE IMPAIRMENTS Abnormal gait, decreased activity tolerance, decreased balance, decreased endurance, decreased mobility, difficulty walking, decreased ROM, decreased strength, hypomobility, increased muscle spasms, impaired flexibility, improper body mechanics, postural dysfunction, and pain.    ACTIVITY LIMITATIONS carrying, lifting, bending, standing, squatting, stairs, transfers, locomotion level, and caring for others  PARTICIPATION LIMITATIONS: meal prep, cleaning, laundry, shopping, community activity, and yard work  [place], Past/current experiences, Time since onset of injury/illness/exacerbation, and 3+ comorbidities: RA, OA, HTN, obesity  are also affecting patient's functional outcome.   REHAB POTENTIAL: Good  CLINICAL DECISION MAKING: Stable/uncomplicated  EVALUATION COMPLEXITY: Low   GOALS: Goals reviewed with patient? Yes  SHORT TERM GOALS: Target date: 12/06/2021  Patient will be independent with HEP in order to improve functional outcomes. Baseline:  Goal status: MET  2.  Patient will report at least 25% improvement in symptoms for improved quality of life. Baseline:  Goal status: MET  LONG TERM GOALS: Target date: 12/27/2021  Patient will report at least 75% improvement in symptoms for improved quality of life. Baseline:  Goal status: MET  2.  Patient will improve FOTO score by at least 17 points in order to indicate improved tolerance to activity. Baseline: 47% function 12/27/21 50% improvement Goal status: IN PROGRESS  3.  Patient will demonstrate at least 25% improvement in lumbar ROM in all restricted planes for improved ability to move trunk while completing chores. Baseline: see AROM Goal status: MET  4.  Patient will be able to ambulate at least 400 feet in 2MWT in order to demonstrate improved tolerance to activity. Baseline: 310 feet 11/7 405 feet Goal status: MET  5.  Patient will demonstrate grade of 5/5 MMT grade in all tested musculature as evidence of improved strength to assist with stair ambulation and gait.   Baseline: see MMT Goal status: MET  6.  Patient will be able to complete 5x STS in under 11.4 seconds in order to reduce the risk of falls. Baseline: 20.31 seconds 12/27/21 11.07 seconds Goal  status: MET    PLAN: PT FREQUENCY: 1x/week  PT DURATION: 6 weeks  PLANNED INTERVENTIONS: Therapeutic exercises, Therapeutic activity, Neuromuscular re-education, Balance training, Gait training, Patient/Family education, Joint manipulation, Joint mobilization, Stair training, Orthotic/Fit training, DME instructions, Aquatic Therapy, Dry Needling, Electrical stimulation, Spinal manipulation, Spinal mobilization, Cryotherapy, Moist heat, Compression bandaging, scar mobilization, Splintting, Taping, Traction, Ultrasound, Ionotophoresis 27m/ml Dexamethasone, and Manual therapy  PLAN FOR NEXT SESSION: core strength, glute strength, progress as able  10:31 AM, 12/27/21 AMearl LatinPT, DPT [facility]

## 2021-12-28 ENCOUNTER — Encounter: Payer: Self-pay | Admitting: Orthopaedic Surgery

## 2021-12-28 ENCOUNTER — Other Ambulatory Visit: Payer: Self-pay

## 2021-12-28 ENCOUNTER — Ambulatory Visit: Payer: Medicare HMO | Admitting: Orthopaedic Surgery

## 2021-12-28 DIAGNOSIS — G8929 Other chronic pain: Secondary | ICD-10-CM | POA: Diagnosis not present

## 2021-12-28 DIAGNOSIS — M5441 Lumbago with sciatica, right side: Secondary | ICD-10-CM | POA: Diagnosis not present

## 2021-12-28 NOTE — Progress Notes (Signed)
The patient comes in to go over MRI of her lumbar spine.  She is currently in physical therapy for her back and her right hip and is more of back related.  We have replaced her right hip before due to severe end-stage arthritis for someone at a young age.  The hip replacement itself is look good and on exam her right hip moves smoothly and fluidly.  Her pain has been in the lower aspect of her lumbar spine all to the right side and does radiate into the hip.  After the very conservative treatment we sent her for an MRI and she is here for review of her lumbar spine MRI today.  She is continuing with physical therapy and she said that is helping her.  On exam she still has significant pain to the right side of her lumbar spine especially with extension of her back.  The MRI of her right side does show bilateral L3-L4 pars defects and this is creating foraminal stenosis at that level.  It is a grade 1 spondylolisthesis.  I would like to send her to Dr. Alvester Morin to consider a right sided ESI at L3-L4.  She will continue physical therapy in the interim.  Once she has an intervention by Dr. Alvester Morin he can get her back to me a few weeks later.  She agrees with this treatment plan.  All questions and concerns were answered and addressed.

## 2022-01-04 ENCOUNTER — Ambulatory Visit (HOSPITAL_COMMUNITY): Payer: Medicare HMO | Admitting: Physical Therapy

## 2022-01-04 DIAGNOSIS — M5459 Other low back pain: Secondary | ICD-10-CM | POA: Diagnosis not present

## 2022-01-04 DIAGNOSIS — M6281 Muscle weakness (generalized): Secondary | ICD-10-CM

## 2022-01-04 DIAGNOSIS — R29898 Other symptoms and signs involving the musculoskeletal system: Secondary | ICD-10-CM | POA: Diagnosis not present

## 2022-01-04 DIAGNOSIS — R2689 Other abnormalities of gait and mobility: Secondary | ICD-10-CM | POA: Diagnosis not present

## 2022-01-04 NOTE — Therapy (Signed)
OUTPATIENT PHYSICAL THERAPY TREATMENT   Patient Name: ENORA TRILLO MRN: [id number] DOB:[birth year], 46 y.o., female Today's Date: 01/04/2022    PT End of Session - 01/04/22 1115    Visit Number 10    Number of Visits 18    Date for PT Re-Evaluation 02/07/22    Authorization Type [employer]    Progress Note Due on Visit 13    PT Start Time 1035    PT Stop Time 1115    PT Time Calculation (min) 40 min    Activity Tolerance Patient tolerated treatment well    Behavior During Therapy WFL for tasks assessed/performed               Past Medical History:  Diagnosis Date   Anxiety    Chronic right lower quadrant pain    DDD (degenerative disc disease), cervical    c6-7   Depression    Dyspnea    On exertion at times   GERD (gastroesophageal reflux disease)    WATCHES DIET   History of hypoglycemia    History of kidney stones    passed spontaneously   Hypertension    Hypothyroidism    OA (osteoarthritis)    knees , hips, feet   PONV (postoperative nausea and vomiting)    Rheumatoid arthritis(714.0) dx  2001  multiple sites   rheumotologist-  dr Herbie [place] gay w/ [facility]--- currently treated w/ oral arava and iv actemra   Shingles    SUI (stress urinary incontinence), female    Past Surgical History:  Procedure Laterality Date   COLONOSCOPY WITH PROPOFOL N/A 10/27/2021   Procedure: COLONOSCOPY WITH PROPOFOL;  Surgeon: Harvel Quale, MD;  Location: [facility];  Service: Gastroenterology;  Laterality: N/A;   DILATION AND CURETTAGE OF UTERUS     KNEE ARTHROSCOPY Right 1999   KNEE ARTHROSCOPY WITH MEDIAL MENISECTOMY Right 02/07/2018   Procedure: KNEE ARTHROSCOPY WITH PARTIAL LATERAL MENISECTOMY;  Surgeon: Carole Civil, MD;  Location: [facility];  Service: Orthopedics;  Laterality: Right;   LAPAROSCOPY N/A 02/16/2017   Procedure: LAPAROSCOPY DIAGNOSTIC;  Surgeon: Molli Posey, MD;  Location: [facility];  Service: Gynecology;   Laterality: N/A;   TOTAL HIP ARTHROPLASTY Right 07/30/2020   Procedure: RIGHT TOTAL HIP ARTHROPLASTY ANTERIOR APPROACH;  Surgeon: Mcarthur Rossetti, MD;  Location: [facility];  Service: Orthopedics;  Laterality: Right;   WISDOM TOOTH EXTRACTION     Patient Active Problem List   Diagnosis Date Noted   Status post total replacement of right hip 07/30/2020   Hypertensive disorder 05/14/2019   Hypothyroidism 05/14/2019   Migraine 05/14/2019   Ovarian endometriosis 05/14/2019   S/P right knee arthroscopy 02/07/18 02/07/2018   Meniscus, lateral, derangement, right    Synovitis of right knee    Arthritis of right knee    Osteoarthritis, multiple sites 01/14/2018   Rheumatoid arthritis of multiple sites with negative rheumatoid factor ([place]) 05/08/2016   Primary osteoarthritis of both knees 08/30/2015   Primary osteoarthritis of right hip 08/30/2015   Primary osteoarthritis of left foot 06/30/2014   Change in stool 07/27/2011   Contraception 01/02/2011   Rheumatoid arthritis(714.0) 02/21/1999    PCP: Asencion Noble MD  REFERRING PROVIDER: Mcarthur Rossetti, MD  12/28/21, 12/21/21 MRI.    REFERRING DIAG: N62.952 (ICD-10-CM) - History of right hip replacement M54.41 (ICD-10-CM) - Acute right-sided low back pain with right-sided sciatica   Rationale for Evaluation and Treatment Rehabilitation  THERAPY DIAG:  Other low back pain  Muscle weakness (generalized)  Other symptoms and signs involving the musculoskeletal system  Other abnormalities of gait and mobility  ONSET DATE: July 2023  SUBJECTIVE:                                                                                                                                                                                           SUBJECTIVE STATEMENT: Pt is still having quite a bit of stiffness due to not being on celebrex but overall is better.  PERTINENT HISTORY:  RA, OA, HTN, obesity, sedentary lifestyle   PAIN:  Are you  having pain? Yes: NPRS scale: 3/10 Pain location: low back and into Rt hip/LE Pain description: stiffness Aggravating factors: standing Relieving factors: rest   PRECAUTIONS: None  WEIGHT BEARING RESTRICTIONS No  FALLS:  Has patient fallen in last 6 months? No  LIVING ENVIRONMENT: Lives with: lives with their spouse Lives in: House/apartment Stairs: Yes: External: 3 steps; none Has following equipment at home: Single point cane, Walker - 2 wheeled, and bed side commode  PATIENT GOALS pain reduction   OBJECTIVE: (measures from initial evaluation unless otherwise dated)  DIAGNOSTIC FINDINGS:  XR 10/12/21 An [facility] pelvis and lateral of the right hip shows a well-seated total hip arthroplasty with no complicating features.  PATIENT SURVEYS:  FOTO 47% function  12/27/21: 50% function  POSTURE: rounded shoulders and forward head  PALPATION: TTP lower R lumbar paraspinals, glutes  LUMBAR ROM:   Active  A/PROM  eval AROM 12/27/21  Flexion 25% limited * 0% limited  Extension 50% limited* 0% limited  Right lateral flexion 0% limited * 0% limited *  Left lateral flexion 0% limited 0% limited  Right rotation 25% limited * 0% limited  Left rotation 25% limited  0% limited   (Blank rows = not tested) *= pain  LOWER EXTREMITY ROM:   WFL for tasks assessed  LOWER EXTREMITY MMT:    MMT Right eval Left eval Right 12/27/21 Left  12/27/21  Hip flexion 4+* _0 Hip extension 4* _1 Hip abduction 4* 4+ 5 5  Hip adduction      Hip internal rotation      Hip external rotation      Knee flexion 5 5    Knee extension 5 5    Ankle dorsiflexion 5 5    Ankle plantarflexion      Ankle inversion      Ankle eversion       (Blank rows = not tested)   FUNCTIONAL TESTS:  5 times sit to stand: 20.31 seconds, hands on thighs, slight reliance on LLE>RLE  2 minute walk test: 310 feet Transfers: labored, pain with transfer  GAIT: Distance walked: 310 feet Assistive device  utilized: None Level of assistance: Complete Independence Comments: 2MWT, gradual increase in symptoms beginning at 1 minute with gradual worsening Mild antalgic with gradual worsening, mild Trendelenburg with worsening after 1 minute  TREATMENT: 01/04/22              Supine: Active hamstring stretch 2 x 30" Pelvic tilt x 10 Dead bug with pelvic tilt x 10  Double knee to chest x 10   Quadriped:  mad cat no old horse x 10  Prone:       Single leg raise x 10                   Single arm raise x 10 (modified to shoulder and elbow at 90 degrees to allow proper stabilization.) Sit :            sit to stand x 10 Reassessment 12/27/21 Reassessment POC discussion 5 times sit to stand: 11.04 seconds, hands on thighs 2 minute walk test: 416fet Transfers: labored, pain with transfer  GAIT: Distance walked: 405 feet Assistive device utilized: None Level of assistance: Complete Independence Comments: 2MWT, minimal symptoms    12/20/21 Review of x-ray findings L3 significantly forward on L4 Supine: LTR x 20 SKTC 5 x 20" TA contraction 5" hold x 10  TA contraction with Hip adduction 5" hold x 10 TA contraction with Hip abduction 5" hold x 10 Piriformis stretch with towel 5 x 20" DKTC 5 x 20"  Seated lumbar flexion x 10    12/15/21   3D hip excursions   Vector stance 3x 5" with 1 HHA   Marching with UE/opposite LE with ab set 10x 3" holds   BTB pallof Tandem stance 20x x2   BTB shoulder extension paired with marching 10x 5"    PATIENT EDUCATION:  Education details: 12/27/21: Reassessment findings, POC, core strength; 11/16/21: HEP;  Eval: Patient educated on exam findings, POC, scope of PT, HEP, and bed mobility. Person educated: Patient Education method: Explanation, Demonstration, and Handouts Education comprehension: verbalized understanding, returned demonstration, verbal cues required, and tactile cues required  HOME EXERCISE PROGRAM: 01/04/22                         - Supine Posterior Pelvic Tilt  - 3 x daily - 7 x weekly - 1 sets - 10 reps                       - Dead Bug  - 1 x daily - 7 x weekly - 1 sets - 10 reps                        - Supine Double Knee to Chest  - 1 x daily - 7 x weekly - 1 sets - 10 reps Access Code: [email or web address]: SKTC, QL stretch with theraball  11/29/21 - Bridge with Hip Abduction and Resistance - Ground Touches  - 1 x daily - 7 x weekly - 2 sets - 10 reps - Supine Hamstring Swiss Ball Curls/Dynamic Mobilization  - 1 x daily - 7 x weekly - 2 sets - 10 reps - 5 second hold - Active Straight Leg Raise with Quad Set  - 1 x daily - 7 x weekly - 2 sets - 10 reps - Side Stepping with  Resistance at Thighs  - 1 x daily - 7 x weekly - 4 sets - 10 reps  11/16/21 - Supine March with Posterior Pelvic Tilt  - 1 x daily - 7 x weekly - 2 sets - 10 reps - Prone Press Up  - 2-3 x daily - 7 x weekly - 2 sets - 10 reps - Prone Hip Extension  - 1 x daily - 7 x weekly - 2 sets - 10 reps - Supine Lower Trunk Rotation  - 1 x daily - 7 x weekly - 10 reps - 5 second hold - Sidelying Hip Abduction  - 1 x daily - 7 x weekly - 2 sets - 10 reps - Sit to Stand  - 1 x daily - 7 x weekly - 2 sets - 10 reps  Date: 11/15/2021 - Supine Bridge  - 2-3 x daily - 7 x weekly - 2 sets - 10 reps - Abdominal Bracing  - 2-3 x daily - 7 x weekly - 2 sets - 10 reps - 5 second hold  ASSESSMENT:  CLINICAL IMPRESSION: Therapist instructed pt in pelvic tilt and encouraged pt to complete pelvic tilt with all exercises. Pt to avoid excessive extension to to anterior slippage of vertebrae.   Pt needs verbal and tactile cuing to keep proper stabilization of low back.  Patient will continue to benefit from skilled physical therapy in order to improve functional strength and reduce impairment.     OBJECTIVE IMPAIRMENTS Abnormal gait, decreased activity tolerance, decreased balance, decreased endurance, decreased mobility, difficulty walking, decreased ROM, decreased  strength, hypomobility, increased muscle spasms, impaired flexibility, improper body mechanics, postural dysfunction, and pain.   ACTIVITY LIMITATIONS carrying, lifting, bending, standing, squatting, stairs, transfers, locomotion level, and caring for others  PARTICIPATION LIMITATIONS: meal prep, cleaning, laundry, shopping, community activity, and yard work  [place], Past/current experiences, Time since onset of injury/illness/exacerbation, and 3+ comorbidities: RA, OA, HTN, obesity  are also affecting patient's functional outcome.   REHAB POTENTIAL: Good  CLINICAL DECISION MAKING: Stable/uncomplicated  EVALUATION COMPLEXITY: Low   GOALS: Goals reviewed with patient? Yes  SHORT TERM GOALS: Target date: 12/06/2021  Patient will be independent with HEP in order to improve functional outcomes. Baseline:  Goal status: MET  2.  Patient will report at least 25% improvement in symptoms for improved quality of life. Baseline:  Goal status: MET    LONG TERM GOALS: Target date: 12/27/2021  Patient will report at least 75% improvement in symptoms for improved quality of life. Baseline:  Goal status: MET  2.  Patient will improve FOTO score by at least 17 points in order to indicate improved tolerance to activity. Baseline: 47% function 12/27/21 50% improvement Goal status: IN PROGRESS  3.  Patient will demonstrate at least 25% improvement in lumbar ROM in all restricted planes for improved ability to move trunk while completing chores. Baseline: see AROM Goal status: MET  4.  Patient will be able to ambulate at least 400 feet in 2MWT in order to demonstrate improved tolerance to activity. Baseline: 310 feet 11/7 405 feet Goal status: MET  5.  Patient will demonstrate grade of 5/5 MMT grade in all tested musculature as evidence of improved strength to assist with stair ambulation and gait.   Baseline: see MMT Goal status: MET  6.  Patient will be able to  complete 5x STS in under 11.4 seconds in order to reduce the risk of falls. Baseline: 20.31 seconds 12/27/21 11.07 seconds Goal status:  MET    PLAN: PT FREQUENCY: 1x/week  PT DURATION: 6 weeks  PLANNED INTERVENTIONS: Therapeutic exercises, Therapeutic activity, Neuromuscular re-education, Balance training, Gait training, Patient/Family education, Joint manipulation, Joint mobilization, Stair training, Orthotic/Fit training, DME instructions, Aquatic Therapy, Dry Needling, Electrical stimulation, Spinal manipulation, Spinal mobilization, Cryotherapy, Moist heat, Compression bandaging, scar mobilization, Splintting, Taping, Traction, Ultrasound, Ionotophoresis 73m/ml Dexamethasone, and Manual therapy  PLAN FOR NEXT SESSION: core strength, glute strength, progress as able [place] Humph[phone]

## 2022-01-06 ENCOUNTER — Telehealth: Payer: Self-pay | Admitting: Physical Medicine and Rehabilitation

## 2022-01-06 NOTE — Telephone Encounter (Signed)
What is the referral for? 

## 2022-01-06 NOTE — Telephone Encounter (Signed)
Pt called requesting an referral appt sent form Dr Magnus Ivan. Please call pt at [phone].

## 2022-01-09 NOTE — Telephone Encounter (Signed)
scheduled

## 2022-01-10 ENCOUNTER — Encounter (HOSPITAL_COMMUNITY): Payer: Medicare HMO | Admitting: Physical Therapy

## 2022-01-17 ENCOUNTER — Ambulatory Visit: Payer: Self-pay

## 2022-01-17 ENCOUNTER — Encounter (HOSPITAL_COMMUNITY): Payer: Medicare HMO | Admitting: Physical Therapy

## 2022-01-17 ENCOUNTER — Ambulatory Visit: Payer: Medicare HMO | Admitting: Physical Medicine and Rehabilitation

## 2022-01-17 VITALS — BP 120/80 | HR 67

## 2022-01-17 DIAGNOSIS — M5416 Radiculopathy, lumbar region: Secondary | ICD-10-CM | POA: Diagnosis not present

## 2022-01-17 MED ORDER — METHYLPREDNISOLONE ACETATE 80 MG/ML IJ SUSP
80.0000 mg | Freq: Once | INTRAMUSCULAR | Status: AC
  Administered 2022-01-17: 80 mg

## 2022-01-17 NOTE — Progress Notes (Signed)
Numeric Pain Rating Scale and Functional Assessment Average Pain 7   In the last MONTH (on 0-10 scale) has pain interfered with the following?  1. General activity like being  able to carry out your everyday physical activities such as walking, climbing stairs, carrying groceries, or moving a chair?  Rating( varies )   +Driver, -BT, -Dye Allergies.  Has stiffness in the mornings.Turning certain ways can make pain worse. Pain radiates to back of thigh and comes around to the front

## 2022-01-17 NOTE — Patient Instructions (Signed)

## 2022-01-19 DIAGNOSIS — M9902 Segmental and somatic dysfunction of thoracic region: Secondary | ICD-10-CM | POA: Diagnosis not present

## 2022-01-19 DIAGNOSIS — M9903 Segmental and somatic dysfunction of lumbar region: Secondary | ICD-10-CM | POA: Diagnosis not present

## 2022-01-19 DIAGNOSIS — S233XXA Sprain of ligaments of thoracic spine, initial encounter: Secondary | ICD-10-CM | POA: Diagnosis not present

## 2022-01-19 DIAGNOSIS — S338XXA Sprain of other parts of lumbar spine and pelvis, initial encounter: Secondary | ICD-10-CM | POA: Diagnosis not present

## 2022-01-24 ENCOUNTER — Ambulatory Visit (HOSPITAL_COMMUNITY): Payer: Medicare HMO | Attending: Orthopaedic Surgery | Admitting: Physical Therapy

## 2022-01-24 ENCOUNTER — Encounter (HOSPITAL_COMMUNITY): Payer: Self-pay | Admitting: Physical Therapy

## 2022-01-24 DIAGNOSIS — R2689 Other abnormalities of gait and mobility: Secondary | ICD-10-CM | POA: Diagnosis not present

## 2022-01-24 DIAGNOSIS — R29898 Other symptoms and signs involving the musculoskeletal system: Secondary | ICD-10-CM | POA: Insufficient documentation

## 2022-01-24 DIAGNOSIS — M6281 Muscle weakness (generalized): Secondary | ICD-10-CM | POA: Diagnosis not present

## 2022-01-24 DIAGNOSIS — M5459 Other low back pain: Secondary | ICD-10-CM | POA: Diagnosis not present

## 2022-01-24 NOTE — Therapy (Signed)
OUTPATIENT PHYSICAL THERAPY TREATMENT   Patient Name: Jessica Pacheco MRN: [id number] DOB:04/23/1975, 46 y.o., female Today's Date: 01/24/2022   END OF SESSION:   PT End of Session - 01/24/22 0938     Visit Number 11    Number of Visits 18    Date for PT Re-Evaluation 02/07/22    Authorization Type [employer]    Progress Note Due on Visit 59    PT Start Time [phone]    PT Stop Time 1020    PT Time Calculation (min) 38 min    Activity Tolerance Patient tolerated treatment well    Behavior During Therapy WFL for tasks assessed/performed               Past Medical History:  Diagnosis Date   Anxiety    Chronic right lower quadrant pain    DDD (degenerative disc disease), cervical    c6-7   Depression    Dyspnea    On exertion at times   GERD (gastroesophageal reflux disease)    WATCHES DIET   History of hypoglycemia    History of kidney stones    passed spontaneously   Hypertension    Hypothyroidism    OA (osteoarthritis)    knees , hips, feet   PONV (postoperative nausea and vomiting)    Rheumatoid arthritis(714.0) dx  2001  multiple sites   rheumotologist-  dr Herbie [place] gay w/ [facility]--- currently treated w/ oral arava and iv actemra   Shingles    SUI (stress urinary incontinence), female    Past Surgical History:  Procedure Laterality Date   COLONOSCOPY WITH PROPOFOL N/A 10/27/2021   Procedure: COLONOSCOPY WITH PROPOFOL;  Surgeon: Harvel Quale, MD;  Location: [facility];  Service: Gastroenterology;  Laterality: N/A;   DILATION AND CURETTAGE OF UTERUS     KNEE ARTHROSCOPY Right 1999   KNEE ARTHROSCOPY WITH MEDIAL MENISECTOMY Right 02/07/2018   Procedure: KNEE ARTHROSCOPY WITH PARTIAL LATERAL MENISECTOMY;  Surgeon: Carole Civil, MD;  Location: [facility];  Service: Orthopedics;  Laterality: Right;   LAPAROSCOPY N/A 02/16/2017   Procedure: LAPAROSCOPY DIAGNOSTIC;  Surgeon: Molli Posey, MD;  Location: [facility];  Service:  Gynecology;  Laterality: N/A;   TOTAL HIP ARTHROPLASTY Right 07/30/2020   Procedure: RIGHT TOTAL HIP ARTHROPLASTY ANTERIOR APPROACH;  Surgeon: Mcarthur Rossetti, MD;  Location: [facility];  Service: Orthopedics;  Laterality: Right;   WISDOM TOOTH EXTRACTION     Patient Active Problem List   Diagnosis Date Noted   Status post total replacement of right hip 07/30/2020   Hypertensive disorder 05/14/2019   Hypothyroidism 05/14/2019   Migraine 05/14/2019   Ovarian endometriosis 05/14/2019   S/P right knee arthroscopy 02/07/18 02/07/2018   Meniscus, lateral, derangement, right    Synovitis of right knee    Arthritis of right knee    Osteoarthritis, multiple sites 01/14/2018   Rheumatoid arthritis of multiple sites with negative rheumatoid factor ([place]) 05/08/2016   Primary osteoarthritis of both knees 08/30/2015   Primary osteoarthritis of right hip 08/30/2015   Primary osteoarthritis of left foot 06/30/2014   Change in stool 07/27/2011   Contraception 01/02/2011   Rheumatoid arthritis(714.0) 02/21/1999    PCP: Asencion Noble MD  REFERRING PROVIDER: Mcarthur Rossetti, MD  12/28/21, 12/21/21 MRI.    REFERRING DIAG: J85.631 (ICD-10-CM) - History of right hip replacement M54.41 (ICD-10-CM) - Acute right-sided low back pain with right-sided sciatica   Rationale for Evaluation and Treatment Rehabilitation  THERAPY DIAG:  Other low back pain  Muscle weakness (generalized)  Other symptoms and signs involving the musculoskeletal system  Other abnormalities of gait and mobility  ONSET DATE: July 2023  SUBJECTIVE:                                                                                                                                                                                           SUBJECTIVE STATEMENT: Had steroid injection last week and it really helped back. She has some muscular pain in R glutes. Has been doing massage to glutes which helps. Has been able to walk  upright. Notices the more she is walking and doing more but hte more she does the more tightness she gets in the low back which can radiate to the R. Pain is not the same as it was before and thinks it might be her SI. DN, massage, muscle rub helps. Muscle relaxors help.     PERTINENT HISTORY:  RA, OA, HTN, obesity, sedentary lifestyle   PAIN:  Are you having pain? Yes: NPRS scale: 2/10 Pain location: low back and into Rt hip/LE Pain description: muscle tightness Aggravating factors: standing Relieving factors: rest   PRECAUTIONS: None  WEIGHT BEARING RESTRICTIONS No  FALLS:  Has patient fallen in last 6 months? No  LIVING ENVIRONMENT: Lives with: lives with their spouse Lives in: House/apartment Stairs: Yes: External: 3 steps; none Has following equipment at home: Single point cane, Walker - 2 wheeled, and bed side commode  PATIENT GOALS pain reduction   OBJECTIVE: (measures from initial evaluation unless otherwise dated)  DIAGNOSTIC FINDINGS:  XR 10/12/21 An [facility] pelvis and lateral of the right hip shows a well-seated total hip arthroplasty with no complicating features.  PATIENT SURVEYS:  FOTO 47% function  12/27/21: 50% function  POSTURE: rounded shoulders and forward head  PALPATION: TTP lower R lumbar paraspinals, glutes  LUMBAR ROM:   Active  A/PROM  eval AROM 12/27/21  Flexion 25% limited * 0% limited  Extension 50% limited* 0% limited  Right lateral flexion 0% limited * 0% limited *  Left lateral flexion 0% limited 0% limited  Right rotation 25% limited * 0% limited  Left rotation 25% limited  0% limited   (Blank rows = not tested) *= pain  LOWER EXTREMITY ROM:   WFL for tasks assessed  LOWER EXTREMITY MMT:    MMT Right eval Left eval Right 12/27/21 Left  12/27/21  Hip flexion 4+* _0 Hip extension 4* _1 Hip abduction 4* 4+ 5 5  Hip adduction      Hip internal rotation  Hip external rotation      Knee flexion 5 5    Knee extension  5 5    Ankle dorsiflexion 5 5    Ankle plantarflexion      Ankle inversion      Ankle eversion       (Blank rows = not tested)   FUNCTIONAL TESTS:  5 times sit to stand: 20.31 seconds, hands on thighs, slight reliance on LLE>RLE 2 minute walk test: 310 feet Transfers: labored, pain with transfer  GAIT: Distance walked: 310 feet Assistive device utilized: None Level of assistance: Complete Independence Comments: 2MWT, gradual increase in symptoms beginning at 1 minute with gradual worsening Mild antalgic with gradual worsening, mild Trendelenburg with worsening after 1 minute  TREATMENT: 01/24/22 Supine glute/piriformis stretch 3 x 20 second holds R  Dead bug with PPT x 10  Quadruped hip extension 2 x 10 bilateral  Childs pose 2 x 20 second holds Lunge with unilateral UE support 1x 10 bilateral  Lift 2x 10 GTB  01/04/22  Supine: Active hamstring stretch 2 x 30" Pelvic tilt x 10 Dead bug with pelvic tilt x 10  Double knee to chest x 10   Quadriped:  mad cat no old horse x 10  Prone:       Single leg raise x 10                   Single arm raise x 10 (modified to shoulder and elbow at 90 degrees to allow proper stabilization.) Sit :            sit to stand x 10  Reassessment 12/27/21 Reassessment POC discussion 5 times sit to stand: 11.04 seconds, hands on thighs 2 minute walk test: 47fet Transfers: labored, pain with transfer  GAIT: Distance walked: 405 feet Assistive device utilized: None Level of assistance: Complete Independence Comments: 2MWT, minimal symptoms    12/20/21 Review of x-ray findings L3 significantly forward on L4 Supine: LTR x 20 SKTC 5 x 20" TA contraction 5" hold x 10  TA contraction with Hip adduction 5" hold x 10 TA contraction with Hip abduction 5" hold x 10 Piriformis stretch with towel 5 x 20" DKTC 5 x 20"  Seated lumbar flexion x 10     PATIENT EDUCATION:  Education details: 12/27/21: Reassessment findings, POC, core  strength; 11/16/21: HEP;  Eval: Patient educated on exam findings, POC, scope of PT, HEP, and bed mobility. Person educated: Patient Education method: Explanation, Demonstration, and Handouts Education comprehension: verbalized understanding, returned demonstration, verbal cues required, and tactile cues required  HOME EXERCISE PROGRAM: Access Code: [email or web address]- Supine Piriformis Stretch with Leg Straight  - 1 x daily - 7 x weekly - 3 reps - 20 second hold - Child's Pose Stretch  - 1 x daily - 7 x weekly - 10 reps - 10 second hold  01/04/22                        - Supine Posterior Pelvic Tilt  - 3 x daily - 7 x weekly - 1 sets - 10 reps                       - Dead Bug  - 1 x daily - 7 x weekly - 1 sets - 10 reps                        -  Supine Double Knee to Chest  - 1 x daily - 7 x weekly - 1 sets - 10 reps  12/07/21: SKTC, QL stretch with theraball  11/29/21 - Bridge with Hip Abduction and Resistance - Ground Touches  - 1 x daily - 7 x weekly - 2 sets - 10 reps - Supine Hamstring Swiss Ball Curls/Dynamic Mobilization  - 1 x daily - 7 x weekly - 2 sets - 10 reps - 5 second hold - Active Straight Leg Raise with Quad Set  - 1 x daily - 7 x weekly - 2 sets - 10 reps - Side Stepping with Resistance at Thighs  - 1 x daily - 7 x weekly - 4 sets - 10 reps  11/16/21 - Supine March with Posterior Pelvic Tilt  - 1 x daily - 7 x weekly - 2 sets - 10 reps - Prone Press Up  - 2-3 x daily - 7 x weekly - 2 sets - 10 reps - Prone Hip Extension  - 1 x daily - 7 x weekly - 2 sets - 10 reps - Supine Lower Trunk Rotation  - 1 x daily - 7 x weekly - 10 reps - 5 second hold - Sidelying Hip Abduction  - 1 x daily - 7 x weekly - 2 sets - 10 reps - Sit to Stand  - 1 x daily - 7 x weekly - 2 sets - 10 reps  Date: 11/15/2021 - Supine Bridge  - 2-3 x daily - 7 x weekly - 2 sets - 10 reps - Abdominal Bracing  - 2-3 x daily - 7 x weekly - 2 sets - 10 reps - 5 second hold  ASSESSMENT:  CLINICAL  IMPRESSION: Began session with stretch to target tender gluteal region with decrease in symptoms following. Attempted birddog exercise but patient demonstrating impaired balance and motor control limiting ability to complete but tolerates hip extension portion with good mechanics following cueing.  Continued with core strengthening. Patient will continue to benefit from skilled physical therapy in order to improve functional strength and reduce impairment.     OBJECTIVE IMPAIRMENTS Abnormal gait, decreased activity tolerance, decreased balance, decreased endurance, decreased mobility, difficulty walking, decreased ROM, decreased strength, hypomobility, increased muscle spasms, impaired flexibility, improper body mechanics, postural dysfunction, and pain.   ACTIVITY LIMITATIONS carrying, lifting, bending, standing, squatting, stairs, transfers, locomotion level, and caring for others  PARTICIPATION LIMITATIONS: meal prep, cleaning, laundry, shopping, community activity, and yard work  [place], Past/current experiences, Time since onset of injury/illness/exacerbation, and 3+ comorbidities: RA, OA, HTN, obesity  are also affecting patient's functional outcome.   REHAB POTENTIAL: Good  CLINICAL DECISION MAKING: Stable/uncomplicated  EVALUATION COMPLEXITY: Low   GOALS: Goals reviewed with patient? Yes  SHORT TERM GOALS: Target date: 12/06/2021  Patient will be independent with HEP in order to improve functional outcomes. Baseline:  Goal status: MET  2.  Patient will report at least 25% improvement in symptoms for improved quality of life. Baseline:  Goal status: MET    LONG TERM GOALS: Target date: 12/27/2021  Patient will report at least 75% improvement in symptoms for improved quality of life. Baseline:  Goal status: MET  2.  Patient will improve FOTO score by at least 17 points in order to indicate improved tolerance to activity. Baseline: 47% function 12/27/21  50% improvement Goal status: IN PROGRESS  3.  Patient will demonstrate at least 25% improvement in lumbar ROM in all restricted planes for improved ability to move  trunk while completing chores. Baseline: see AROM Goal status: MET  4.  Patient will be able to ambulate at least 400 feet in 2MWT in order to demonstrate improved tolerance to activity. Baseline: 310 feet 11/7 405 feet Goal status: MET  5.  Patient will demonstrate grade of 5/5 MMT grade in all tested musculature as evidence of improved strength to assist with stair ambulation and gait.   Baseline: see MMT Goal status: MET  6.  Patient will be able to complete 5x STS in under 11.4 seconds in order to reduce the risk of falls. Baseline: 20.31 seconds 12/27/21 11.07 seconds Goal status: MET    PLAN: PT FREQUENCY: 1x/week  PT DURATION: 6 weeks  PLANNED INTERVENTIONS: Therapeutic exercises, Therapeutic activity, Neuromuscular re-education, Balance training, Gait training, Patient/Family education, Joint manipulation, Joint mobilization, Stair training, Orthotic/Fit training, DME instructions, Aquatic Therapy, Dry Needling, Electrical stimulation, Spinal manipulation, Spinal mobilization, Cryotherapy, Moist heat, Compression bandaging, scar mobilization, Splintting, Taping, Traction, Ultrasound, Ionotophoresis 55m/ml Dexamethasone, and Manual therapy  PLAN FOR NEXT SESSION: core strength, glute strength, progress as able  10:22 AM, 01/24/22 AMearl LatinPT, DPT [facility]

## 2022-01-31 ENCOUNTER — Encounter: Payer: Self-pay | Admitting: Orthopaedic Surgery

## 2022-01-31 ENCOUNTER — Encounter (HOSPITAL_COMMUNITY): Payer: Self-pay

## 2022-01-31 ENCOUNTER — Ambulatory Visit (HOSPITAL_COMMUNITY): Payer: Medicare HMO

## 2022-01-31 ENCOUNTER — Encounter: Payer: Self-pay | Admitting: Physical Medicine and Rehabilitation

## 2022-01-31 DIAGNOSIS — R2689 Other abnormalities of gait and mobility: Secondary | ICD-10-CM | POA: Diagnosis not present

## 2022-01-31 DIAGNOSIS — M5459 Other low back pain: Secondary | ICD-10-CM | POA: Diagnosis not present

## 2022-01-31 DIAGNOSIS — M6281 Muscle weakness (generalized): Secondary | ICD-10-CM | POA: Diagnosis not present

## 2022-01-31 DIAGNOSIS — R29898 Other symptoms and signs involving the musculoskeletal system: Secondary | ICD-10-CM | POA: Diagnosis not present

## 2022-01-31 NOTE — Therapy (Signed)
OUTPATIENT PHYSICAL THERAPY TREATMENT   Patient Name: Jessica Pacheco MRN: [id number] DOB:Nov 26, 1975, 46 y.o., female Today's Date: 01/31/2022   END OF SESSION:   PT End of Session - 01/31/22 0959     Visit Number 12    Number of Visits 18    Date for PT Re-Evaluation 02/07/22    Authorization Type [employer]    Progress Note Due on Visit 7    PT Start Time 0951    PT Stop Time 1035    PT Time Calculation (min) 44 min    Activity Tolerance Patient tolerated treatment well    Behavior During Therapy WFL for tasks assessed/performed                Past Medical History:  Diagnosis Date   Anxiety    Chronic right lower quadrant pain    DDD (degenerative disc disease), cervical    c6-7   Depression    Dyspnea    On exertion at times   GERD (gastroesophageal reflux disease)    WATCHES DIET   History of hypoglycemia    History of kidney stones    passed spontaneously   Hypertension    Hypothyroidism    OA (osteoarthritis)    knees , hips, feet   PONV (postoperative nausea and vomiting)    Rheumatoid arthritis(714.0) dx  2001  multiple sites   rheumotologist-  dr Herbie [place] gay w/ [facility]--- currently treated w/ oral arava and iv actemra   Shingles    SUI (stress urinary incontinence), female    Past Surgical History:  Procedure Laterality Date   COLONOSCOPY WITH PROPOFOL N/A 10/27/2021   Procedure: COLONOSCOPY WITH PROPOFOL;  Surgeon: Harvel Quale, MD;  Location: [facility];  Service: Gastroenterology;  Laterality: N/A;   DILATION AND CURETTAGE OF UTERUS     KNEE ARTHROSCOPY Right 1999   KNEE ARTHROSCOPY WITH MEDIAL MENISECTOMY Right 02/07/2018   Procedure: KNEE ARTHROSCOPY WITH PARTIAL LATERAL MENISECTOMY;  Surgeon: Carole Civil, MD;  Location: [facility];  Service: Orthopedics;  Laterality: Right;   LAPAROSCOPY N/A 02/16/2017   Procedure: LAPAROSCOPY DIAGNOSTIC;  Surgeon: Molli Posey, MD;  Location: [facility];   Service: Gynecology;  Laterality: N/A;   TOTAL HIP ARTHROPLASTY Right 07/30/2020   Procedure: RIGHT TOTAL HIP ARTHROPLASTY ANTERIOR APPROACH;  Surgeon: Mcarthur Rossetti, MD;  Location: [facility];  Service: Orthopedics;  Laterality: Right;   WISDOM TOOTH EXTRACTION     Patient Active Problem List   Diagnosis Date Noted   Status post total replacement of right hip 07/30/2020   Hypertensive disorder 05/14/2019   Hypothyroidism 05/14/2019   Migraine 05/14/2019   Ovarian endometriosis 05/14/2019   S/P right knee arthroscopy 02/07/18 02/07/2018   Meniscus, lateral, derangement, right    Synovitis of right knee    Arthritis of right knee    Osteoarthritis, multiple sites 01/14/2018   Rheumatoid arthritis of multiple sites with negative rheumatoid factor ([facility]) 05/08/2016   Primary osteoarthritis of both knees 08/30/2015   Primary osteoarthritis of right hip 08/30/2015   Primary osteoarthritis of left foot 06/30/2014   Change in stool 07/27/2011   Contraception 01/02/2011   Rheumatoid arthritis(714.0) 02/21/1999    PCP: Asencion Noble MD  REFERRING PROVIDER: Mcarthur Rossetti, MD  12/28/21, 12/21/21 MRI.    REFERRING DIAG: M38.177 (ICD-10-CM) - History of right hip replacement M54.41 (ICD-10-CM) - Acute right-sided low back pain with right-sided sciatica   Rationale for Evaluation and Treatment Rehabilitation  THERAPY DIAG:  Other low back pain  Muscle weakness (generalized)  Other symptoms and signs involving the musculoskeletal system  Other abnormalities of gait and mobility  ONSET DATE: July 2023  SUBJECTIVE:                                                                                                                                                                                           SUBJECTIVE STATEMENT: Reports the steroid injection 2 weeks ago was really helped.  No reports of pain, just tightness in Rt lower back/gluteal region.  Has began the new stretches,  wishes for a stretch to complete while standing.  Stated more tightness while walking.    PERTINENT HISTORY:  RA, OA, HTN, obesity, sedentary lifestyle   PAIN:  Are you having pain? Yes: NPRS scale: 5 tightness, no pain/10 Pain location: low back and into Rt hip/LE Pain description: muscle tightness Aggravating factors: standing Relieving factors: rest   PRECAUTIONS: None  WEIGHT BEARING RESTRICTIONS No  FALLS:  Has patient fallen in last 6 months? No  LIVING ENVIRONMENT: Lives with: lives with their spouse Lives in: House/apartment Stairs: Yes: External: 3 steps; none Has following equipment at home: Single point cane, Walker - 2 wheeled, and bed side commode  PATIENT GOALS pain reduction   OBJECTIVE: (measures from initial evaluation unless otherwise dated)  DIAGNOSTIC FINDINGS:  XR 10/12/21 An [facility] pelvis and lateral of the right hip shows a well-seated total hip arthroplasty with no complicating features.  PATIENT SURVEYS:  FOTO 47% function  12/27/21: 50% function  POSTURE: rounded shoulders and forward head  PALPATION: TTP lower R lumbar paraspinals, glutes  LUMBAR ROM:   Active  A/PROM  eval AROM 12/27/21  Flexion 25% limited * 0% limited  Extension 50% limited* 0% limited  Right lateral flexion 0% limited * 0% limited *  Left lateral flexion 0% limited 0% limited  Right rotation 25% limited * 0% limited  Left rotation 25% limited  0% limited   (Blank rows = not tested) *= pain  LOWER EXTREMITY ROM:   WFL for tasks assessed  LOWER EXTREMITY MMT:    MMT Right eval Left eval Right 12/27/21 Left  12/27/21  Hip flexion 4+* _0 Hip extension 4* _1 Hip abduction 4* 4+ 5 5  Hip adduction      Hip internal rotation      Hip external rotation      Knee flexion 5 5    Knee extension 5 5    Ankle dorsiflexion 5 5    Ankle plantarflexion      Ankle inversion  Ankle eversion       (Blank rows = not tested)   FUNCTIONAL TESTS:  5 times  sit to stand: 20.31 seconds, hands on thighs, slight reliance on LLE>RLE 2 minute walk test: 310 feet Transfers: labored, pain with transfer  GAIT: Distance walked: 310 feet Assistive device utilized: None Level of assistance: Complete Independence Comments: 2MWT, gradual increase in symptoms beginning at 1 minute with gradual worsening Mild antalgic with gradual worsening, mild Trendelenburg with worsening after 1 minute  TREATMENT: 01/31/22:   3D hip excursion 10x 10" with UE movements Quadruped: cat/camel 5x 10"  Child's pose 2x 30" neutral  Child pose 2x 30" Rt and Lt Increased difficulty with core stability with quadruped Prone UE/LE 10x 5" Supine: dead bug 90/90 Standing: Marching with theraband shoulder extension 10x 5" increased difficulty with Lt LE weight bearing)  01/24/22 Supine glute/piriformis stretch 3 x 20 second holds R  Dead bug with PPT x 10  Quadruped hip extension 2 x 10 bilateral  Childs pose 2 x 20 second holds Lunge with unilateral UE support 1x 10 bilateral  Lift 2x 10 GTB  01/04/22  Supine: Active hamstring stretch 2 x 30" Pelvic tilt x 10 Dead bug with pelvic tilt x 10  Double knee to chest x 10   Quadriped:  mad cat no old horse x 10  Prone:       Single leg raise x 10                   Single arm raise x 10 (modified to shoulder and elbow at 90 degrees to allow proper stabilization.) Sit :            sit to stand x 10  Reassessment 12/27/21 Reassessment POC discussion 5 times sit to stand: 11.04 seconds, hands on thighs 2 minute walk test: 426fet Transfers: labored, pain with transfer  GAIT: Distance walked: 405 feet Assistive device utilized: None Level of assistance: Complete Independence Comments: 2MWT, minimal symptoms    12/20/21 Review of x-ray findings L3 significantly forward on L4 Supine: LTR x 20 SKTC 5 x 20" TA contraction 5" hold x 10  TA contraction with Hip adduction 5" hold x 10 TA contraction with Hip  abduction 5" hold x 10 Piriformis stretch with towel 5 x 20" DKTC 5 x 20"  Seated lumbar flexion x 10     PATIENT EDUCATION:  Education details: 12/27/21: Reassessment findings, POC, core strength; 11/16/21: HEP;  Eval: Patient educated on exam findings, POC, scope of PT, HEP, and bed mobility. Person educated: Patient Education method: Explanation, Demonstration, and Handouts Education comprehension: verbalized understanding, returned demonstration, verbal cues required, and tactile cues required  HOME EXERCISE PROGRAM: Access Code: [email or web address]: 3D hip excursion with UE movements for increased stretch Rt and Lt child pose 30" holds  01/24/22- Supine Piriformis Stretch with Leg Straight  - 1 x daily - 7 x weekly - 3 reps - 20 second hold - Child's Pose Stretch  - 1 x daily - 7 x weekly - 10 reps - 10 second hold  01/04/22                        - Supine Posterior Pelvic Tilt  - 3 x daily - 7 x weekly - 1 sets - 10 reps                       - Dead Bug  -  1 x daily - 7 x weekly - 1 sets - 10 reps                        - Supine Double Knee to Chest  - 1 x daily - 7 x weekly - 1 sets - 10 reps  12/07/21: SKTC, QL stretch with theraball  11/29/21 - Bridge with Hip Abduction and Resistance - Ground Touches  - 1 x daily - 7 x weekly - 2 sets - 10 reps - Supine Hamstring Swiss Ball Curls/Dynamic Mobilization  - 1 x daily - 7 x weekly - 2 sets - 10 reps - 5 second hold - Active Straight Leg Raise with Quad Set  - 1 x daily - 7 x weekly - 2 sets - 10 reps - Side Stepping with Resistance at Thighs  - 1 x daily - 7 x weekly - 4 sets - 10 reps  11/16/21 - Supine March with Posterior Pelvic Tilt  - 1 x daily - 7 x weekly - 2 sets - 10 reps - Prone Press Up  - 2-3 x daily - 7 x weekly - 2 sets - 10 reps - Prone Hip Extension  - 1 x daily - 7 x weekly - 2 sets - 10 reps - Supine Lower Trunk Rotation  - 1 x daily - 7 x weekly - 10 reps - 5 second hold - Sidelying Hip Abduction  - 1 x  daily - 7 x weekly - 2 sets - 10 reps - Sit to Stand  - 1 x daily - 7 x weekly - 2 sets - 10 reps  Date: 11/15/2021 - Supine Bridge  - 2-3 x daily - 7 x weekly - 2 sets - 10 reps - Abdominal Bracing  - 2-3 x daily - 7 x weekly - 2 sets - 10 reps - 5 second hold  ASSESSMENT:  CLINICAL IMPRESSION: Began session with stretch to target tender gluteal region with decrease in symptoms following. Attempted birddog exercise but patient demonstrating impaired balance and motor control limiting ability to complete but tolerates hip extension portion with good mechanics following cueing.  Continued with core strengthening. Patient will continue to benefit from skilled physical therapy in order to improve functional strength and reduce impairment.    Session focus with stretches to address c/o tightness in Rt lower back and gluteal region.  Added 3D hip excursion to HEP and yoga stretches for mobility.  Pt demonstrated impaired core stability with difficulty with balance and motor control limiting proper form with quadruped exercises, modified to prone with improved mechanics.  Discussed progressing core strength with increased difficulty with current HEP.  Added Rt and Lt side child's pose, pt stated she feels targeted problem area and plans to add to current HEP.  Anticipate pt DC to HEP next session.  OBJECTIVE IMPAIRMENTS Abnormal gait, decreased activity tolerance, decreased balance, decreased endurance, decreased mobility, difficulty walking, decreased ROM, decreased strength, hypomobility, increased muscle spasms, impaired flexibility, improper body mechanics, postural dysfunction, and pain.   ACTIVITY LIMITATIONS carrying, lifting, bending, standing, squatting, stairs, transfers, locomotion level, and caring for others  PARTICIPATION LIMITATIONS: meal prep, cleaning, laundry, shopping, community activity, and yard work  [place], Past/current experiences, Time since onset of  injury/illness/exacerbation, and 3+ comorbidities: RA, OA, HTN, obesity  are also affecting patient's functional outcome.   REHAB POTENTIAL: Good  CLINICAL DECISION MAKING: Stable/uncomplicated  EVALUATION COMPLEXITY: Low   GOALS: Goals reviewed with patient?  Yes  SHORT TERM GOALS: Target date: 12/06/2021  Patient will be independent with HEP in order to improve functional outcomes. Baseline:  Goal status: MET  2.  Patient will report at least 25% improvement in symptoms for improved quality of life. Baseline:  Goal status: MET    LONG TERM GOALS: Target date: 12/27/2021  Patient will report at least 75% improvement in symptoms for improved quality of life. Baseline:  Goal status: MET  2.  Patient will improve FOTO score by at least 17 points in order to indicate improved tolerance to activity. Baseline: 47% function 12/27/21 50% improvement Goal status: IN PROGRESS  3.  Patient will demonstrate at least 25% improvement in lumbar ROM in all restricted planes for improved ability to move trunk while completing chores. Baseline: see AROM Goal status: MET  4.  Patient will be able to ambulate at least 400 feet in 2MWT in order to demonstrate improved tolerance to activity. Baseline: 310 feet 11/7 405 feet Goal status: MET  5.  Patient will demonstrate grade of 5/5 MMT grade in all tested musculature as evidence of improved strength to assist with stair ambulation and gait.   Baseline: see MMT Goal status: MET  6.  Patient will be able to complete 5x STS in under 11.4 seconds in order to reduce the risk of falls. Baseline: 20.31 seconds 12/27/21 11.07 seconds Goal status: MET    PLAN: PT FREQUENCY: 1x/week  PT DURATION: 6 weeks  PLANNED INTERVENTIONS: Therapeutic exercises, Therapeutic activity, Neuromuscular re-education, Balance training, Gait training, Patient/Family education, Joint manipulation, Joint mobilization, Stair training, Orthotic/Fit training, DME  instructions, Aquatic Therapy, Dry Needling, Electrical stimulation, Spinal manipulation, Spinal mobilization, Cryotherapy, Moist heat, Compression bandaging, scar mobilization, Splintting, Taping, Traction, Ultrasound, Ionotophoresis 32m/ml Dexamethasone, and Manual therapy  PLAN FOR NEXT SESSION: reassess for probable DC to HEP next session.  CIhor Austin LPTA/CLT; CBIS [phone] 10:45 AM, 01/31/22

## 2022-01-31 NOTE — Procedures (Signed)
Lumbar Epidural Steroid Injection - Interlaminar Approach with Fluoroscopic Guidance  Patient: Jessica Pacheco      Date of Birth: 11-07-1975 MRN: [id number] PCP: Carylon Perches, MD      Visit Date: 01/17/2022   Universal Protocol:     Consent Given By: the patient  Position: PRONE  Additional Comments: Vital signs were monitored before and after the procedure. Patient was prepped and draped in the usual sterile fashion. The correct patient, procedure, and site was verified.   Injection Procedure Details:   Procedure diagnoses: Lumbar radiculopathy [M54.16]   Meds Administered:  Meds ordered this encounter  Medications   methylPREDNISolone acetate (DEPO-MEDROL) injection 80 mg     Laterality: Right  Location/Site:  L3-4  Needle: 3.5 in., 20 ga. Tuohy  Needle Placement: Paramedian epidural  Findings:   -Comments: Excellent flow of contrast into the epidural space.  Procedure Details: Using a paramedian approach from the side mentioned above, the region overlying the inferior lamina was localized under fluoroscopic visualization and the soft tissues overlying this structure were infiltrated with 4 ml. of 1% Lidocaine without Epinephrine. The Tuohy needle was inserted into the epidural space using a paramedian approach.   The epidural space was localized using loss of resistance along with counter oblique bi-planar fluoroscopic views.  After negative aspirate for air, blood, and CSF, a 2 ml. volume of Isovue-250 was injected into the epidural space and the flow of contrast was observed. Radiographs were obtained for documentation purposes.    The injectate was administered into the level noted above.   Additional Comments:  The patient tolerated the procedure well Dressing: 2 x 2 sterile gauze and Band-Aid    Post-procedure details: Patient was observed during the procedure. Post-procedure instructions were reviewed.  Patient left the clinic in stable condition.

## 2022-01-31 NOTE — Progress Notes (Signed)
Jessica Pacheco - 46 y.o. female MRN [id number]  Date of birth: Jun 29, 1975  Office Visit Note: Visit Date: 01/17/2022 PCP: Asencion Noble, MD Referred by: Asencion Noble, MD  Subjective: Chief Complaint  Patient presents with   Lower Back - Pain   HPI:  Jessica Pacheco is a 46 y.o. female who comes in today at the request of Dr. Jean Rosenthal for planned Right L3-4 Lumbar Interlaminar epidural steroid injection with fluoroscopic guidance.  The patient has failed conservative care including home exercise, medications, time and activity modification.  This injection will be diagnostic and hopefully therapeutic.  Please see requesting physician notes for further details and justification.   ROS Otherwise per HPI.  Assessment & Plan: Visit Diagnoses:    ICD-10-CM   1. Lumbar radiculopathy  M54.16 XR C-ARM NO REPORT    Epidural Steroid injection    methylPREDNISolone acetate (DEPO-MEDROL) injection 80 mg      Plan: No additional findings.   Meds & Orders:  Meds ordered this encounter  Medications   methylPREDNISolone acetate (DEPO-MEDROL) injection 80 mg    Orders Placed This Encounter  Procedures   XR C-ARM NO REPORT   Epidural Steroid injection    Follow-up: Return for visit to requesting provider as needed.   Procedures: No procedures performed  Lumbar Epidural Steroid Injection - Interlaminar Approach with Fluoroscopic Guidance  Patient: Jessica Pacheco      Date of Birth: August 01, 1975 MRN: [id number] PCP: Asencion Noble, MD      Visit Date: 01/17/2022   Universal Protocol:     Consent Given By: the patient  Position: PRONE  Additional Comments: Vital signs were monitored before and after the procedure. Patient was prepped and draped in the usual sterile fashion. The correct patient, procedure, and site was verified.   Injection Procedure Details:   Procedure diagnoses: Lumbar radiculopathy [M54.16]   Meds Administered:  Meds ordered this encounter   Medications   methylPREDNISolone acetate (DEPO-MEDROL) injection 80 mg     Laterality: Right  Location/Site:  L3-4  Needle: 3.5 in., 20 ga. Tuohy  Needle Placement: Paramedian epidural  Findings:   -Comments: Excellent flow of contrast into the epidural space.  Procedure Details: Using a paramedian approach from the side mentioned above, the region overlying the inferior lamina was localized under fluoroscopic visualization and the soft tissues overlying this structure were infiltrated with 4 ml. of 1% Lidocaine without Epinephrine. The Tuohy needle was inserted into the epidural space using a paramedian approach.   The epidural space was localized using loss of resistance along with counter oblique bi-planar fluoroscopic views.  After negative aspirate for air, blood, and CSF, a 2 ml. volume of Isovue-250 was injected into the epidural space and the flow of contrast was observed. Radiographs were obtained for documentation purposes.    The injectate was administered into the level noted above.   Additional Comments:  The patient tolerated the procedure well Dressing: 2 x 2 sterile gauze and Band-Aid    Post-procedure details: Patient was observed during the procedure. Post-procedure instructions were reviewed.  Patient left the clinic in stable condition.   Clinical History: MRI LUMBAR SPINE WITHOUT CONTRAST   TECHNIQUE: Multiplanar, multisequence MR imaging of the lumbar spine was performed. No intravenous contrast was administered.   COMPARISON:  Lumbar spine x-rays dated December 12, 2021.   FINDINGS: Segmentation:  Standard.   Alignment: 6 mm anterolisthesis at L3-L4 due to bilateral L3 pars defects.   Vertebrae: No fracture, evidence of  discitis, or bone lesion. Asymmetric right-sided degenerative endplate marrow edema at L3-L4.   Conus medullaris and cauda equina: Conus extends to the T12-L1 level. Conus and cauda equina appear normal.   Paraspinal and  other soft tissues: Negative.   Disc levels:   T11-T12 to L2-L3: Negative.   L3-L4: Disc uncovering and mild disc bulging eccentric to the right. Mild right lateral recess and bilateral neuroforaminal stenosis. No spinal canal stenosis.   L4-L5:  Negative.   L5-S1: Negative disc. Mild bilateral facet arthropathy. No stenosis.   IMPRESSION: 1. Grade 1 anterolisthesis at L3-L4 due to bilateral L3 pars defects. Mild right lateral recess and bilateral neuroforaminal stenosis at this level.     Electronically Signed   By: Obie Dredge M.D.   On: 12/23/2021 14:21     Objective:  VS:  HT:    WT:   BMI:     BP:120/80  HR:67bpm  TEMP: ( )  RESP:  Physical Exam Vitals and nursing note reviewed.  Constitutional:      General: She is not in acute distress.    Appearance: Normal appearance. She is obese. She is not ill-appearing.  HENT:     Head: Normocephalic and atraumatic.     Right Ear: External ear normal.     Left Ear: External ear normal.  Eyes:     Extraocular Movements: Extraocular movements intact.  Cardiovascular:     Rate and Rhythm: Normal rate.     Pulses: Normal pulses.  Pulmonary:     Effort: Pulmonary effort is normal. No respiratory distress.  Abdominal:     General: There is no distension.     Palpations: Abdomen is soft.  Musculoskeletal:        General: Tenderness present.     Cervical back: Neck supple.     Right lower leg: No edema.     Left lower leg: No edema.     Comments: Patient has good distal strength with no pain over the greater trochanters.  No clonus or focal weakness.  Skin:    Findings: No erythema, lesion or rash.  Neurological:     General: No focal deficit present.     Mental Status: She is alert and oriented to person, place, and time.     Sensory: No sensory deficit.     Motor: No weakness or abnormal muscle tone.     Coordination: Coordination normal.  Psychiatric:        Mood and Affect: Mood normal.        Behavior:  Behavior normal.      Imaging: No results found.

## 2022-02-07 ENCOUNTER — Ambulatory Visit (HOSPITAL_COMMUNITY): Payer: Medicare HMO | Admitting: Physical Therapy

## 2022-02-07 ENCOUNTER — Encounter (HOSPITAL_COMMUNITY): Payer: Self-pay | Admitting: Physical Therapy

## 2022-02-07 DIAGNOSIS — R29898 Other symptoms and signs involving the musculoskeletal system: Secondary | ICD-10-CM

## 2022-02-07 DIAGNOSIS — R2689 Other abnormalities of gait and mobility: Secondary | ICD-10-CM

## 2022-02-07 DIAGNOSIS — M5459 Other low back pain: Secondary | ICD-10-CM | POA: Diagnosis not present

## 2022-02-07 DIAGNOSIS — M6281 Muscle weakness (generalized): Secondary | ICD-10-CM

## 2022-02-07 NOTE — Therapy (Signed)
OUTPATIENT PHYSICAL THERAPY TREATMENT   Patient Name: Jessica Pacheco MRN: [id number] DOB:06/19/1975, 46 y.o., female Today's Date: 02/07/2022  PHYSICAL THERAPY DISCHARGE SUMMARY  Visits from Start of Care: 13  Current functional level related to goals / functional outcomes: See below    Remaining deficits: See below   Education / Equipment: See below   Patient agrees to discharge. Patient goals were met. Patient is being discharged due to being pleased with the current functional level.   END OF SESSION:   PT End of Session - 02/07/22 0939     Visit Number 13    Number of Visits 18    Date for PT Re-Evaluation 02/07/22    Authorization Type [employer]    Progress Note Due on Visit 74    PT Start Time 0945    PT Stop Time 1012    PT Time Calculation (min) 27 min    Activity Tolerance Patient tolerated treatment well    Behavior During Therapy WFL for tasks assessed/performed                Past Medical History:  Diagnosis Date   Anxiety    Chronic right lower quadrant pain    DDD (degenerative disc disease), cervical    c6-7   Depression    Dyspnea    On exertion at times   GERD (gastroesophageal reflux disease)    WATCHES DIET   History of hypoglycemia    History of kidney stones    passed spontaneously   Hypertension    Hypothyroidism    OA (osteoarthritis)    knees , hips, feet   PONV (postoperative nausea and vomiting)    Rheumatoid arthritis(714.0) dx  2001  multiple sites   rheumotologist-  dr Herbie [place] gay w/ [facility]--- currently treated w/ oral arava and iv actemra   Shingles    SUI (stress urinary incontinence), female    Past Surgical History:  Procedure Laterality Date   COLONOSCOPY WITH PROPOFOL N/A 10/27/2021   Procedure: COLONOSCOPY WITH PROPOFOL;  Surgeon: Harvel Quale, MD;  Location: [facility];  Service: Gastroenterology;  Laterality: N/A;   DILATION AND CURETTAGE OF UTERUS     KNEE ARTHROSCOPY Right 1999    KNEE ARTHROSCOPY WITH MEDIAL MENISECTOMY Right 02/07/2018   Procedure: KNEE ARTHROSCOPY WITH PARTIAL LATERAL MENISECTOMY;  Surgeon: Carole Civil, MD;  Location: [facility];  Service: Orthopedics;  Laterality: Right;   LAPAROSCOPY N/A 02/16/2017   Procedure: LAPAROSCOPY DIAGNOSTIC;  Surgeon: Molli Posey, MD;  Location: [facility];  Service: Gynecology;  Laterality: N/A;   TOTAL HIP ARTHROPLASTY Right 07/30/2020   Procedure: RIGHT TOTAL HIP ARTHROPLASTY ANTERIOR APPROACH;  Surgeon: Mcarthur Rossetti, MD;  Location: [facility];  Service: Orthopedics;  Laterality: Right;   WISDOM TOOTH EXTRACTION     Patient Active Problem List   Diagnosis Date Noted   Status post total replacement of right hip 07/30/2020   Hypertensive disorder 05/14/2019   Hypothyroidism 05/14/2019   Migraine 05/14/2019   Ovarian endometriosis 05/14/2019   S/P right knee arthroscopy 02/07/18 02/07/2018   Meniscus, lateral, derangement, right    Synovitis of right knee    Arthritis of right knee    Osteoarthritis, multiple sites 01/14/2018   Rheumatoid arthritis of multiple sites with negative rheumatoid factor ([place]) 05/08/2016   Primary osteoarthritis of both knees 08/30/2015   Primary osteoarthritis of right hip 08/30/2015   Primary osteoarthritis of left foot 06/30/2014   Change in stool 07/27/2011  Contraception 01/02/2011   Rheumatoid arthritis(714.0) 02/21/1999    PCP: Asencion Noble MD  REFERRING PROVIDER: Mcarthur Rossetti, MD  12/28/21, 12/21/21 MRI.    REFERRING DIAG: Z61.096 (ICD-10-CM) - History of right hip replacement M54.41 (ICD-10-CM) - Acute right-sided low back pain with right-sided sciatica   Rationale for Evaluation and Treatment Rehabilitation  THERAPY DIAG:  Other low back pain  Muscle weakness (generalized)  Other symptoms and signs involving the musculoskeletal system  Other abnormalities of gait and mobility  ONSET DATE: July 2023  SUBJECTIVE:                                                                                                                                                                                            SUBJECTIVE STATEMENT: She has been feeling good. Surprisingly very well. Some tightness with walking. Wants some stretches she can do while sitting/standing. Has been doing HEP. Patient states 100% improvement since beginning PT.     PERTINENT HISTORY:  RA, OA, HTN, obesity, sedentary lifestyle   PAIN:  Are you having pain? Yes: NPRS scale: 1 stiffness, no pain/10 Pain location: low back and into Rt hip/LE Pain description: muscle tightness Aggravating factors: standing Relieving factors: rest   PRECAUTIONS: None  WEIGHT BEARING RESTRICTIONS No  FALLS:  Has patient fallen in last 6 months? No  LIVING ENVIRONMENT: Lives with: lives with their spouse Lives in: House/apartment Stairs: Yes: External: 3 steps; none Has following equipment at home: Single point cane, Walker - 2 wheeled, and bed side commode  PATIENT GOALS pain reduction   OBJECTIVE: (measures from initial evaluation unless otherwise dated)  DIAGNOSTIC FINDINGS:  XR 10/12/21 An [facility] pelvis and lateral of the right hip shows a well-seated total hip arthroplasty with no complicating features.  PATIENT SURVEYS:  FOTO 47% function  12/27/21: 50% function 02/07/22: 54 %function  POSTURE: rounded shoulders and forward head  PALPATION: TTP lower R lumbar paraspinals, glutes  LUMBAR ROM:   Active  A/PROM  eval AROM 12/27/21 AROM 02/07/22  Flexion 25% limited * 0% limited 0% limited  Extension 50% limited* 0% limited 0% limited  Right lateral flexion 0% limited * 0% limited * 0% limited  (tightness R lumbar)  Left lateral flexion 0% limited 0% limited 0% limited (tightness R lumbar)  Right rotation 25% limited * 0% limited 0% limited  Left rotation 25% limited  0% limited 0% limited   (Blank rows = not tested) *= pain  LOWER EXTREMITY  ROM:   WFL for tasks assessed  LOWER EXTREMITY MMT:    MMT Right eval Left eval Right 12/27/21 Left  12/27/21 Right 02/07/22  Left  02/07/22  Hip flexion 4+* _0 Hip extension 4* _1 Hip abduction 4* 4+ _2 Hip adduction        Hip internal rotation        Hip external rotation        Knee flexion 5 5      Knee extension 5 5      Ankle dorsiflexion 5 5      Ankle plantarflexion        Ankle inversion        Ankle eversion         (Blank rows = not tested)   FUNCTIONAL TESTS:  5 times sit to stand: 20.31 seconds, hands on thighs, slight reliance on LLE>RLE 2 minute walk test: 310 feet Transfers: labored, pain with transfer  GAIT: Distance walked: 310 feet Assistive device utilized: None Level of assistance: Complete Independence Comments: 2MWT, gradual increase in symptoms beginning at 1 minute with gradual worsening Mild antalgic with gradual worsening, mild Trendelenburg with worsening after 1 minute  TREATMENT: 02/07/22 Reassessment Seated piriformis stretch 3 x 20 second holds Standing trunk flexion stretch x 10 seconds Seated trunk flexion stretch x 10 second hold  01/31/22:   3D hip excursion 10x 10" with UE movements Quadruped: cat/camel 5x 10"  Child's pose 2x 30" neutral  Child pose 2x 30" Rt and Lt Increased difficulty with core stability with quadruped Prone UE/LE 10x 5" Supine: dead bug 90/90 Standing: Marching with theraband shoulder extension 10x 5" increased difficulty with Lt LE weight bearing)  01/24/22 Supine glute/piriformis stretch 3 x 20 second holds R  Dead bug with PPT x 10  Quadruped hip extension 2 x 10 bilateral  Childs pose 2 x 20 second holds Lunge with unilateral UE support 1x 10 bilateral  Lift 2x 10 GTB  01/04/22  Supine: Active hamstring stretch 2 x 30" Pelvic tilt x 10 Dead bug with pelvic tilt x 10  Double knee to chest x 10   Quadriped:  mad cat no old horse x 10  Prone:       Single leg raise x  10                   Single arm raise x 10 (modified to shoulder and elbow at 90 degrees to allow proper stabilization.) Sit :            sit to stand x 10  Reassessment 12/27/21 Reassessment POC discussion 5 times sit to stand: 11.04 seconds, hands on thighs 2 minute walk test: 435fet Transfers: labored, pain with transfer  GAIT: Distance walked: 405 feet Assistive device utilized: None Level of assistance: Complete Independence Comments: 2MWT, minimal symptoms    12/20/21 Review of x-ray findings L3 significantly forward on L4 Supine: LTR x 20 SKTC 5 x 20" TA contraction 5" hold x 10  TA contraction with Hip adduction 5" hold x 10 TA contraction with Hip abduction 5" hold x 10 Piriformis stretch with towel 5 x 20" DKTC 5 x 20"  Seated lumbar flexion x 10     PATIENT EDUCATION:  Education details: 02/07/22: HEP, reassessment findings; 12/27/21: Reassessment findings, POC, core strength; 11/16/21: HEP;  Eval: Patient educated on exam findings, POC, scope of PT, HEP, and bed mobility. Person educated: Patient Education method: Explanation, Demonstration, and Handouts Education comprehension: verbalized understanding, returned demonstration, verbal cues required, and tactile cues required  HOME EXERCISE PROGRAM:  Access Code: [email or web address] 02/07/22 - Seated Piriformis Stretch  - 1 x daily - 7 x weekly - 3 reps - 20 second  hold - Standing 'L' Stretch at [employer]  - 1 x daily - 7 x weekly - 3 reps - 10 second hold - Seated Flexion Stretch with Swiss Ball  - 1 x daily - 7 x weekly - 3 reps - 10 second hold  01/31/22: 3D hip excursion with UE movements for increased stretch Rt and Lt child pose 30" holds  01/24/22- Supine Piriformis Stretch with Leg Straight  - 1 x daily - 7 x weekly - 3 reps - 20 second hold - Child's Pose Stretch  - 1 x daily - 7 x weekly - 10 reps - 10 second hold  01/04/22                        - Supine Posterior Pelvic Tilt  - 3 x daily - 7 x weekly - 1  sets - 10 reps                       - Dead Bug  - 1 x daily - 7 x weekly - 1 sets - 10 reps                        - Supine Double Knee to Chest  - 1 x daily - 7 x weekly - 1 sets - 10 reps  12/07/21: SKTC, QL stretch with theraball  11/29/21 - Bridge with Hip Abduction and Resistance - Ground Touches  - 1 x daily - 7 x weekly - 2 sets - 10 reps - Supine Hamstring Swiss Ball Curls/Dynamic Mobilization  - 1 x daily - 7 x weekly - 2 sets - 10 reps - 5 second hold - Active Straight Leg Raise with Quad Set  - 1 x daily - 7 x weekly - 2 sets - 10 reps - Side Stepping with Resistance at Thighs  - 1 x daily - 7 x weekly - 4 sets - 10 reps  11/16/21 - Supine March with Posterior Pelvic Tilt  - 1 x daily - 7 x weekly - 2 sets - 10 reps - Prone Press Up  - 2-3 x daily - 7 x weekly - 2 sets - 10 reps - Prone Hip Extension  - 1 x daily - 7 x weekly - 2 sets - 10 reps - Supine Lower Trunk Rotation  - 1 x daily - 7 x weekly - 10 reps - 5 second hold - Sidelying Hip Abduction  - 1 x daily - 7 x weekly - 2 sets - 10 reps - Sit to Stand  - 1 x daily - 7 x weekly - 2 sets - 10 reps  Date: 11/15/2021 - Supine Bridge  - 2-3 x daily - 7 x weekly - 2 sets - 10 reps - Abdominal Bracing  - 2-3 x daily - 7 x weekly - 2 sets - 10 reps - 5 second hold  ASSESSMENT:  CLINICAL IMPRESSION: Patient has met 2/2 short term goals and 5/6 long term goals with ability to complete HEP and improvement in symptoms, strength, ROM, activity tolerance, gait, and functional mobility. Remaining goals not met due to continued deficits in activity tolerance and functional mobility as evidence of FOTO score although she has improved since beginning PT. Patient feels ready to d/c to HEP. Reviewed additional  stretches at the patient's request that may be helpful due to feeling improvement with similar stretches. Patient will continue to benefit from skilled physical therapy in order to improve function and reduce impairment.     OBJECTIVE IMPAIRMENTS Abnormal gait, decreased activity tolerance, decreased balance, decreased endurance, decreased mobility, difficulty walking, decreased ROM, decreased strength, hypomobility, increased muscle spasms, impaired flexibility, improper body mechanics, postural dysfunction, and pain.   ACTIVITY LIMITATIONS carrying, lifting, bending, standing, squatting, stairs, transfers, locomotion level, and caring for others  PARTICIPATION LIMITATIONS: meal prep, cleaning, laundry, shopping, community activity, and yard work  [place], Past/current experiences, Time since onset of injury/illness/exacerbation, and 3+ comorbidities: RA, OA, HTN, obesity  are also affecting patient's functional outcome.   REHAB POTENTIAL: Good  CLINICAL DECISION MAKING: Stable/uncomplicated  EVALUATION COMPLEXITY: Low   GOALS: Goals reviewed with patient? Yes  SHORT TERM GOALS: Target date: 12/06/2021  Patient will be independent with HEP in order to improve functional outcomes. Baseline:  Goal status: MET  2.  Patient will report at least 25% improvement in symptoms for improved quality of life. Baseline:  Goal status: MET    LONG TERM GOALS: Target date: 12/27/2021  Patient will report at least 75% improvement in symptoms for improved quality of life. Baseline:  Goal status: MET  2.  Patient will improve FOTO score by at least 17 points in order to indicate improved tolerance to activity. Baseline: 47% function 12/27/21 50% improvement 02/07/22: 54% function Goal status: NOT MET  3.  Patient will demonstrate at least 25% improvement in lumbar ROM in all restricted planes for improved ability to move trunk while completing chores. Baseline: see AROM Goal status: MET  4.  Patient will be able to ambulate at least 400 feet in 2MWT in order to demonstrate improved tolerance to activity. Baseline: 310 feet 11/7 405 feet Goal status: MET  5.  Patient will demonstrate  grade of 5/5 MMT grade in all tested musculature as evidence of improved strength to assist with stair ambulation and gait.   Baseline: see MMT Goal status: MET  6.  Patient will be able to complete 5x STS in under 11.4 seconds in order to reduce the risk of falls. Baseline: 20.31 seconds 12/27/21 11.07 seconds Goal status: MET    PLAN: PT FREQUENCY: 1x/week  PT DURATION: 6 weeks  PLANNED INTERVENTIONS: Therapeutic exercises, Therapeutic activity, Neuromuscular re-education, Balance training, Gait training, Patient/Family education, Joint manipulation, Joint mobilization, Stair training, Orthotic/Fit training, DME instructions, Aquatic Therapy, Dry Needling, Electrical stimulation, Spinal manipulation, Spinal mobilization, Cryotherapy, Moist heat, Compression bandaging, scar mobilization, Splintting, Taping, Traction, Ultrasound, Ionotophoresis 68m/ml Dexamethasone, and Manual therapy  PLAN FOR NEXT SESSION: n/a  10:17 AM, 02/07/22 AMearl LatinPT, DPT Physical Therapist at [facility]

## 2022-02-16 DIAGNOSIS — S338XXA Sprain of other parts of lumbar spine and pelvis, initial encounter: Secondary | ICD-10-CM | POA: Diagnosis not present

## 2022-02-16 DIAGNOSIS — M9903 Segmental and somatic dysfunction of lumbar region: Secondary | ICD-10-CM | POA: Diagnosis not present

## 2022-02-16 DIAGNOSIS — M9902 Segmental and somatic dysfunction of thoracic region: Secondary | ICD-10-CM | POA: Diagnosis not present

## 2022-02-16 DIAGNOSIS — S233XXA Sprain of ligaments of thoracic spine, initial encounter: Secondary | ICD-10-CM | POA: Diagnosis not present

## 2022-02-27 DIAGNOSIS — H524 Presbyopia: Secondary | ICD-10-CM | POA: Diagnosis not present

## 2022-03-03 DIAGNOSIS — I10 Essential (primary) hypertension: Secondary | ICD-10-CM | POA: Diagnosis not present

## 2022-03-03 DIAGNOSIS — I471 Supraventricular tachycardia, unspecified: Secondary | ICD-10-CM | POA: Diagnosis not present

## 2022-03-03 DIAGNOSIS — B0223 Postherpetic polyneuropathy: Secondary | ICD-10-CM | POA: Diagnosis not present

## 2022-03-10 ENCOUNTER — Other Ambulatory Visit: Payer: Self-pay | Admitting: Cardiovascular Disease

## 2022-03-14 DIAGNOSIS — M9903 Segmental and somatic dysfunction of lumbar region: Secondary | ICD-10-CM | POA: Diagnosis not present

## 2022-03-14 DIAGNOSIS — S338XXA Sprain of other parts of lumbar spine and pelvis, initial encounter: Secondary | ICD-10-CM | POA: Diagnosis not present

## 2022-03-14 DIAGNOSIS — S233XXA Sprain of ligaments of thoracic spine, initial encounter: Secondary | ICD-10-CM | POA: Diagnosis not present

## 2022-03-14 DIAGNOSIS — M9902 Segmental and somatic dysfunction of thoracic region: Secondary | ICD-10-CM | POA: Diagnosis not present

## 2022-04-06 DIAGNOSIS — M0609 Rheumatoid arthritis without rheumatoid factor, multiple sites: Secondary | ICD-10-CM | POA: Diagnosis not present

## 2022-04-06 DIAGNOSIS — Z79899 Other long term (current) drug therapy: Secondary | ICD-10-CM | POA: Diagnosis not present

## 2022-04-13 DIAGNOSIS — Z79899 Other long term (current) drug therapy: Secondary | ICD-10-CM | POA: Diagnosis not present

## 2022-04-13 DIAGNOSIS — Z1330 Encounter for screening examination for mental health and behavioral disorders, unspecified: Secondary | ICD-10-CM | POA: Diagnosis not present

## 2022-04-13 DIAGNOSIS — M0609 Rheumatoid arthritis without rheumatoid factor, multiple sites: Secondary | ICD-10-CM | POA: Diagnosis not present

## 2022-04-13 DIAGNOSIS — D84821 Immunodeficiency due to drugs: Secondary | ICD-10-CM | POA: Diagnosis not present

## 2022-04-20 ENCOUNTER — Encounter: Payer: Self-pay | Admitting: Radiology

## 2022-04-24 ENCOUNTER — Other Ambulatory Visit (HOSPITAL_COMMUNITY): Payer: Self-pay | Admitting: Obstetrics and Gynecology

## 2022-04-24 DIAGNOSIS — Z1231 Encounter for screening mammogram for malignant neoplasm of breast: Secondary | ICD-10-CM

## 2022-04-25 DIAGNOSIS — M9903 Segmental and somatic dysfunction of lumbar region: Secondary | ICD-10-CM | POA: Diagnosis not present

## 2022-04-25 DIAGNOSIS — S338XXA Sprain of other parts of lumbar spine and pelvis, initial encounter: Secondary | ICD-10-CM | POA: Diagnosis not present

## 2022-04-25 DIAGNOSIS — M9902 Segmental and somatic dysfunction of thoracic region: Secondary | ICD-10-CM | POA: Diagnosis not present

## 2022-04-25 DIAGNOSIS — S233XXA Sprain of ligaments of thoracic spine, initial encounter: Secondary | ICD-10-CM | POA: Diagnosis not present

## 2022-04-28 ENCOUNTER — Telehealth: Payer: Self-pay | Admitting: Orthopedic Surgery

## 2022-04-28 NOTE — Telephone Encounter (Signed)
Returned a call to [place] regarding OE:[id number], a request for medical records.  Advised them to contact [place] at [phone] for records request.

## 2022-05-03 ENCOUNTER — Ambulatory Visit (HOSPITAL_COMMUNITY)
Admission: RE | Admit: 2022-05-03 | Discharge: 2022-05-03 | Disposition: A | Discharge status: Home / Self Care | Payer: Medicare HMO | Source: Ambulatory Visit | Attending: Obstetrics and Gynecology | Admitting: Obstetrics and Gynecology

## 2022-05-03 DIAGNOSIS — Z1231 Encounter for screening mammogram for malignant neoplasm of breast: Secondary | ICD-10-CM | POA: Diagnosis not present

## 2022-05-08 NOTE — Progress Notes (Signed)
Cardiology Office Note:   Date:  05/12/2022  NAME:  Jessica Pacheco    MRN: [id number] DOB:  July 27, 1975   PCP:  Asencion Noble, MD  Cardiologist:  None  Electrophysiologist:  None   Referring MD: Asencion Noble, MD   Chief Complaint  Patient presents with   Follow-up    6 months.   Headache   Shortness of Breath   Edema   History of Present Illness:   Jessica Pacheco is a 47 y.o. female with a hx of atrial tachycardia who presents for follow-up.   She reports she is still getting heart racing episodes but pulse remains regular.  Her husband is a paramedic.  Can check her out.  Seems to be stable.  She gets short of breath and has exertional chest tightness.  Feels like pressure.  Does not occur with all activity but is continuing to recur.  We did an echocardiogram that was normal.  No signs of congestive heart failure.  No recurrence of atrial tachycardia on monitor.  We discussed coronary CTA given strong family history of heart disease.  She is interested.  Suspect we may not find any significant CAD but may find early disease that needs prevention.  Symptoms seem to improve with cessation of activity.  Has not had repeat lipids in over 3 years.  Total cholesterol 233, HDL 41, LDL 169, triglycerides 176  Problem List Rheumatoid arthritis  HTN Atrial tachycardia  -during colonoscopy  -no recurrence on zio 11/2021  Past Medical History: Past Medical History:  Diagnosis Date   Anxiety    Chronic right lower quadrant pain    DDD (degenerative disc disease), cervical    c6-7   Depression    Dyspnea    On exertion at times   GERD (gastroesophageal reflux disease)    WATCHES DIET   History of hypoglycemia    History of kidney stones    passed spontaneously   Hypertension    Hypothyroidism    OA (osteoarthritis)    knees , hips, feet   PONV (postoperative nausea and vomiting)    Rheumatoid arthritis(714.0) dx  2001  multiple sites   rheumotologist-  dr Herbie [place] gay w/ [facility]---  currently treated w/ oral arava and iv actemra   Shingles    SUI (stress urinary incontinence), female     Past Surgical History: Past Surgical History:  Procedure Laterality Date   COLONOSCOPY WITH PROPOFOL N/A 10/27/2021   Procedure: COLONOSCOPY WITH PROPOFOL;  Surgeon: Harvel Quale, MD;  Location: [facility];  Service: Gastroenterology;  Laterality: N/A;   DILATION AND CURETTAGE OF UTERUS     KNEE ARTHROSCOPY Right 1999   KNEE ARTHROSCOPY WITH MEDIAL MENISECTOMY Right 02/07/2018   Procedure: KNEE ARTHROSCOPY WITH PARTIAL LATERAL MENISECTOMY;  Surgeon: Carole Civil, MD;  Location: [facility];  Service: Orthopedics;  Laterality: Right;   LAPAROSCOPY N/A 02/16/2017   Procedure: LAPAROSCOPY DIAGNOSTIC;  Surgeon: Molli Posey, MD;  Location: [facility];  Service: Gynecology;  Laterality: N/A;   TOTAL HIP ARTHROPLASTY Right 07/30/2020   Procedure: RIGHT TOTAL HIP ARTHROPLASTY ANTERIOR APPROACH;  Surgeon: Mcarthur Rossetti, MD;  Location: [facility];  Service: Orthopedics;  Laterality: Right;   WISDOM TOOTH EXTRACTION      Current Medications: Current Meds  Medication Sig   amLODipine (NORVASC) 10 MG tablet Take 10 mg by mouth at bedtime.   Ascorbic Acid (VITAMIN C PO) Take 1 tablet by mouth at bedtime.   escitalopram (LEXAPRO)  20 MG tablet Take 20 mg by mouth in the morning.   hydrochlorothiazide (HYDRODIURIL) 25 MG tablet Take 25 mg by mouth daily after breakfast.   hydroxychloroquine (PLAQUENIL) 200 MG tablet Take 200 mg by mouth 2 (two) times daily.   ibuprofen (ADVIL) 200 MG tablet Take 800 mg by mouth every 8 (eight) hours as needed (pain.).   JUNEL FE 1/20 1-20 MG-MCG tablet Take 1 tablet by mouth every evening. Continuous (only takes active pills)   levothyroxine (SYNTHROID) 100 MCG tablet Take 100 mcg by mouth daily before breakfast.   loratadine (CLARITIN) 10 MG tablet Take 10 mg by mouth daily as needed for allergies.   LORazepam (ATIVAN)  0.5 MG tablet Take 0.5 mg by mouth daily as needed for anxiety.   melatonin 3 MG TABS tablet Take 3 mg by mouth at bedtime as needed (sleep).   metoprolol tartrate (LOPRESSOR) 25 MG tablet TAKE 1 TABLET TWICE A DAY   oxyCODONE (OXY IR/ROXICODONE) 5 MG immediate release tablet Take 1 tablet (5 mg total) by mouth every 6 (six) hours as needed for moderate pain (pain score 4-6).   Upadacitinib ER (RINVOQ) 15 MG TB24 Take 15 mg by mouth in the morning.     Allergies:    Patient has no known allergies.   Social History: Social History   Socioeconomic History   Marital status: Married    Spouse name: Not on file   Number of children: 1   Years of education: Not on file   Highest education level: Not on file  Occupational History   Occupation: Disabled  Tobacco Use   Smoking status: Never   Smokeless tobacco: Never  Vaping Use   Vaping Use: Never used  Substance and Sexual Activity   Alcohol use: No   Drug use: No   Sexual activity: Yes    Birth control/protection: None  Other Topics Concern   Not on file  Social History Narrative   Not on file   Social Determinants of Health   Financial Resource Strain: Not on file  Food Insecurity: Not on file  Transportation Needs: Not on file  Physical Activity: Not on file  Stress: Not on file  Social Connections: Not on file     Family History: The patient's family history includes Colon cancer in her father and paternal aunt; Heart disease in her maternal grandfather and maternal grandmother.  ROS:   All other ROS reviewed and negative. Pertinent positives noted in the HPI.     EKGs/Labs/Other Studies Reviewed:   The following studies were personally reviewed by me today:  TTE 11/11/2021  1. Left ventricular ejection fraction, by estimation, is 65 to 70%. The  left ventricle has normal function. The left ventricle has no regional  wall motion abnormalities. Left ventricular diastolic parameters were  normal.   2. Right  ventricular systolic function is normal. The right ventricular  size is normal. There is normal pulmonary artery systolic pressure.   3. The mitral valve is normal in structure. Trivial mitral valve  regurgitation.   4. The aortic valve is tricuspid. Aortic valve regurgitation is not  visualized.   5. The inferior vena cava is normal in size with greater than 50%  respiratory variability, suggesting right atrial pressure of 3 mmHg.   Recent Labs: 10/26/2021: BUN 10; Creatinine, Ser 1.11; Hemoglobin 14.8; Magnesium 1.8; Platelets 173; Potassium 3.7; Sodium 138; TSH 3.473   Recent Lipid Panel No results found for: "CHOL", "TRIG", "HDL", "CHOLHDL", "VLDL", "LDLCALC", "LDLDIRECT"  Physical Exam:   VS:  BP 118/70 (BP Location: Left Arm, Patient Position: Sitting, Cuff Size: Large)   Pulse 66   Ht 5' 6.5" (1.689 m)   Wt 246 lb (111.6 kg)   BMI 39.11 kg/m    Wt Readings from Last 3 Encounters:  05/12/22 246 lb (111.6 kg)  11/10/21 241 lb 3.2 oz (109.4 kg)  10/27/21 234 lb (106.1 kg)    General: Well nourished, well developed, in no acute distress Head: Atraumatic, normal size  Eyes: PEERLA, EOMI  Neck: Supple, no JVD Endocrine: No thryomegaly Cardiac: Normal S1, S2; RRR; no murmurs, rubs, or gallops Lungs: Clear to auscultation bilaterally, no wheezing, rhonchi or rales  Abd: Soft, nontender, no hepatomegaly  Ext: No edema, pulses 2+ Musculoskeletal: No deformities, BUE and BLE strength normal and equal Skin: Warm and dry, no rashes   Neuro: Alert and oriented to person, place, time, and situation, CNII-XII grossly intact, no focal deficits  Psych: Normal mood and affect   ASSESSMENT:   Jessica Pacheco is a 47 y.o. female who presents for the following: 1. Atrial tachycardia   2. Precordial pain     PLAN:   1. Atrial tachycardia 2. Precordial pain -Atrial tachycardia not exist on recent monitor.  Will continue metoprolol tartrate 25 mg twice daily.  Now with exertional chest  pressure and shortness of breath.  Echo was normal.  Will proceed with coronary CTA.  Will need a BMP today.  She will take 50 mg of metoprolol titrate to scan.  Based on family history of heart disease.  CV risk that include hypertension, obesity and strong family history of heart disease.  She will see [place] back yearly.  Will notify her of her results by phone.  She may end up needing to be on a statin.  LDL cholesterol is uncontrolled.  She does need labs rechecked at some point.  She will do this in the next few weeks with Dr. Willey Blade.   Disposition: Return in about 1 year (around 05/12/2023).  Medication Adjustments/Labs and Tests Ordered: Current medicines are reviewed at length with the patient today.  Concerns regarding medicines are outlined above.  Orders Placed This Encounter  Procedures   CT CORONARY MORPH W/CTA COR W/SCORE W/CA W/CM &/OR WO/CM   Basic metabolic panel   No orders of the defined types were placed in this encounter.   Patient Instructions  Medication Instructions:  Take Metoprolol 50 mg two hours before CT when scheduled.   *If you need a refill on your cardiac medications before your next appointment, please call your pharmacy*   Lab Work: BMET today   If you have labs (blood work) drawn today and your tests are completely normal, you will receive your results only by: [place] (if you have MyChart) OR A paper copy in the mail If you have any lab test that is abnormal or we need to change your treatment, we will call you to review the results.   Testing/Procedures: Coronary CTA- They will call you to schedule this.   Follow-Up: At [facility], you and your health needs are our priority.  As part of our continuing mission to provide you with exceptional heart care, we have created designated Provider Care Teams.  These Care Teams include your primary Cardiologist (physician) and Advanced Practice Providers (APPs -  Physician Assistants and  Nurse Practitioners) who all work together to provide you with the care you need, when you need it.  We  recommend signing up for the patient portal called "MyChart".  Sign up information is provided on this After Visit Summary.  MyChart is used to connect with patients for Virtual Visits (Telemedicine).  Patients are able to view lab/test results, encounter notes, upcoming appointments, etc.  Non-urgent messages can be sent to your provider as well.   To learn more about what you can do with MyChart, go to [email or web address].    Your next appointment:   12 month(s)  Provider:   Eleonore Chiquito, MD   Other Instructions   Your cardiac CT will be scheduled at one of the below locations:   [facility] [address] [place], [place] [address] [phone]   If scheduled at [facility], please arrive at the [facility] and Children's Entrance (Entrance C2) of [facility] 30 minutes prior to test start time. You can use the FREE valet parking offered at entrance C (encouraged to control the heart rate for the test)  Proceed to the [facility] Radiology Department (first floor) to check-in and test prep.  All radiology patients and guests should use entrance C2 at [facility], accessed from [facility], even though the hospital's physical address listed is [address].       Please follow these instructions carefully (unless otherwise directed):   On the Night Before the Test: Be sure to Drink plenty of water. Do not consume any caffeinated/decaffeinated beverages or chocolate 12 hours prior to your test. Do not take any antihistamines 12 hours prior to your test.  On the Day of the Test: Drink plenty of water until 1 hour prior to the test. Do not eat any food 1 hour prior to test. You may take your regular medications prior to the test.  Take metoprolol (Lopressor) two hours prior to test. If you take  Furosemide/Hydrochlorothiazide/Spironolactone, please HOLD on the morning of the test. FEMALES- please wear underwire-free bra if available, avoid dresses & tight clothing      After the Test: Drink plenty of water. After receiving IV contrast, you may experience a mild flushed feeling. This is normal. On occasion, you may experience a mild rash up to 24 hours after the test. This is not dangerous. If this occurs, you can take Benadryl 25 mg and increase your fluid intake. If you experience trouble breathing, this can be serious. If it is severe call 911 IMMEDIATELY. If it is mild, please call our office. If you take any of these medications: Glipizide/Metformin, Avandament, Glucavance, please do not take 48 hours after completing test unless otherwise instructed.  We will call to schedule your test 2-4 weeks out understanding that some insurance companies will need an authorization prior to the service being performed.   For non-scheduling related questions, please contact the cardiac imaging nurse navigator should you have any questions/concerns: Marchia Bond, Cardiac Imaging Nurse Navigator Gordy Clement, Cardiac Imaging Nurse Navigator [facility] Direct Office Dial: [phone]   For scheduling needs, including cancellations and rescheduling, please call [place], [phone].     Time Spent with Patient: I have spent a total of 35 minutes with patient reviewing hospital notes, telemetry, EKGs, labs and examining the patient as well as establishing an assessment and plan that was discussed with the patient.  > 50% of time was spent in direct patient care.  Signed, Addison Naegeli. Audie Box, MD, [place]  [address] [place],  [address] [phone]  [id number]  05/12/2022 11:14 AM

## 2022-05-11 DIAGNOSIS — Z01419 Encounter for gynecological examination (general) (routine) without abnormal findings: Secondary | ICD-10-CM | POA: Diagnosis not present

## 2022-05-11 DIAGNOSIS — Z309 Encounter for contraceptive management, unspecified: Secondary | ICD-10-CM | POA: Diagnosis not present

## 2022-05-11 DIAGNOSIS — Z6839 Body mass index (BMI) 39.0-39.9, adult: Secondary | ICD-10-CM | POA: Diagnosis not present

## 2022-05-12 ENCOUNTER — Ambulatory Visit: Payer: Medicare HMO | Attending: Cardiovascular Disease | Admitting: Cardiovascular Disease

## 2022-05-12 ENCOUNTER — Encounter: Payer: Self-pay | Admitting: Cardiovascular Disease

## 2022-05-12 VITALS — BP 118/70 | HR 66 | Ht 66.5 in | Wt 246.0 lb

## 2022-05-12 DIAGNOSIS — R072 Precordial pain: Secondary | ICD-10-CM | POA: Diagnosis not present

## 2022-05-12 DIAGNOSIS — I4719 Other supraventricular tachycardia: Secondary | ICD-10-CM | POA: Diagnosis not present

## 2022-05-12 NOTE — Patient Instructions (Signed)
Medication Instructions:  Take Metoprolol 50 mg two hours before CT when scheduled.   *If you need a refill on your cardiac medications before your next appointment, please call your pharmacy*   Lab Work: BMET today   If you have labs (blood work) drawn today and your tests are completely normal, you will receive your results only by: [place] (if you have MyChart) OR A paper copy in the mail If you have any lab test that is abnormal or we need to change your treatment, we will call you to review the results.   Testing/Procedures: Coronary CTA- They will call you to schedule this.   Follow-Up: At [facility], you and your health needs are our priority.  As part of our continuing mission to provide you with exceptional heart care, we have created designated Provider Care Teams.  These Care Teams include your primary Cardiologist (physician) and Advanced Practice Providers (APPs -  Physician Assistants and Nurse Practitioners) who all work together to provide you with the care you need, when you need it.  We recommend signing up for the patient portal called "MyChart".  Sign up information is provided on this After Visit Summary.  MyChart is used to connect with patients for Virtual Visits (Telemedicine).  Patients are able to view lab/test results, encounter notes, upcoming appointments, etc.  Non-urgent messages can be sent to your provider as well.   To learn more about what you can do with MyChart, go to [email or web address].    Your next appointment:   12 month(s)  Provider:   Eleonore Chiquito, MD   Other Instructions   Your cardiac CT will be scheduled at one of the below locations:   [facility] [address] [place], [place] [address] [phone]   If scheduled at [facility], please arrive at the [facility] and Children's Entrance (Entrance C2) of [facility] 30 minutes prior to test start time. You can use the FREE valet  parking offered at entrance C (encouraged to control the heart rate for the test)  Proceed to the [facility] Radiology Department (first floor) to check-in and test prep.  All radiology patients and guests should use entrance C2 at [facility], accessed from [facility], even though the hospital's physical address listed is [address].       Please follow these instructions carefully (unless otherwise directed):   On the Night Before the Test: Be sure to Drink plenty of water. Do not consume any caffeinated/decaffeinated beverages or chocolate 12 hours prior to your test. Do not take any antihistamines 12 hours prior to your test.  On the Day of the Test: Drink plenty of water until 1 hour prior to the test. Do not eat any food 1 hour prior to test. You may take your regular medications prior to the test.  Take metoprolol (Lopressor) two hours prior to test. If you take Furosemide/Hydrochlorothiazide/Spironolactone, please HOLD on the morning of the test. FEMALES- please wear underwire-free bra if available, avoid dresses & tight clothing      After the Test: Drink plenty of water. After receiving IV contrast, you may experience a mild flushed feeling. This is normal. On occasion, you may experience a mild rash up to 24 hours after the test. This is not dangerous. If this occurs, you can take Benadryl 25 mg and increase your fluid intake. If you experience trouble breathing, this can be serious. If it is severe call 911  IMMEDIATELY. If it is mild, please call our office. If you take any of these medications: Glipizide/Metformin, Avandament, Glucavance, please do not take 48 hours after completing test unless otherwise instructed.  We will call to schedule your test 2-4 weeks out understanding that some insurance companies will need an authorization prior to the service being performed.   For non-scheduling related questions, please contact the  cardiac imaging nurse navigator should you have any questions/concerns: Marchia Bond, Cardiac Imaging Nurse Navigator Gordy Clement, Cardiac Imaging Nurse Navigator [place] [facility] Direct Office Dial: [phone]   For scheduling needs, including cancellations and rescheduling, please call [place], [phone].

## 2022-05-13 LAB — BASIC METABOLIC PANEL
BUN/Creatinine Ratio: 14 (ref 9–23)
BUN: 14 mg/dL (ref 6–24)
CO2: 24 mmol/L (ref 20–29)
Calcium: 9.7 mg/dL (ref 8.7–10.2)
Chloride: 103 mmol/L (ref 96–106)
Creatinine, Ser: 1.01 mg/dL — ABNORMAL HIGH (ref 0.57–1.00)
Glucose: 98 mg/dL (ref 70–99)
Potassium: 4 mmol/L (ref 3.5–5.2)
Sodium: 141 mmol/L (ref 134–144)
eGFR: 70 mL/min/{1.73_m2} (ref >59)

## 2022-05-16 DIAGNOSIS — M9902 Segmental and somatic dysfunction of thoracic region: Secondary | ICD-10-CM | POA: Diagnosis not present

## 2022-05-16 DIAGNOSIS — M9903 Segmental and somatic dysfunction of lumbar region: Secondary | ICD-10-CM | POA: Diagnosis not present

## 2022-05-16 DIAGNOSIS — S338XXA Sprain of other parts of lumbar spine and pelvis, initial encounter: Secondary | ICD-10-CM | POA: Diagnosis not present

## 2022-05-16 DIAGNOSIS — S233XXA Sprain of ligaments of thoracic spine, initial encounter: Secondary | ICD-10-CM | POA: Diagnosis not present

## 2022-05-18 ENCOUNTER — Telehealth (HOSPITAL_COMMUNITY): Payer: Self-pay | Admitting: Emergency Medicine

## 2022-05-18 NOTE — Telephone Encounter (Signed)
Reaching out to patient to offer assistance regarding upcoming cardiac imaging study; pt verbalizes understanding of appt date/time, parking situation and where to check in, pre-test NPO status and medications ordered, and verified current allergies; name and call back number provided for further questions should they arise Marchia Bond RN Navigator Cardiac Imaging [facility] [phone] office [phone] cell  Arrival [address] entrance Denies iv issues 50mg  metoprolol tartrate  Aware contrast/nitro

## 2022-05-19 ENCOUNTER — Ambulatory Visit (HOSPITAL_COMMUNITY)
Admission: RE | Admit: 2022-05-19 | Discharge: 2022-05-19 | Disposition: A | Discharge status: Home / Self Care | Payer: Medicare HMO | Source: Ambulatory Visit | Attending: Cardiovascular Disease | Admitting: Cardiovascular Disease

## 2022-05-19 DIAGNOSIS — R072 Precordial pain: Secondary | ICD-10-CM | POA: Diagnosis not present

## 2022-05-19 MED ORDER — IOHEXOL 350 MG/ML SOLN
100.0000 mL | Freq: Once | INTRAVENOUS | Status: AC | PRN
  Administered 2022-05-19: 100 mL via INTRAVENOUS

## 2022-05-19 MED ORDER — NITROGLYCERIN 0.4 MG SL SUBL: 0.8000 mg | SUBLINGUAL_TABLET | Freq: Once | SUBLINGUAL | Status: AC

## 2022-05-19 MED ORDER — NITROGLYCERIN 0.4 MG SL SUBL
SUBLINGUAL_TABLET | SUBLINGUAL | Status: AC
  Administered 2022-05-19: 0.8 mg via SUBLINGUAL
  Filled 2022-05-19: qty 2

## 2022-05-22 ENCOUNTER — Other Ambulatory Visit: Payer: Self-pay

## 2022-05-22 MED ORDER — ROSUVASTATIN CALCIUM 10 MG PO TABS: 10.0000 mg | ORAL_TABLET | Freq: Every day | ORAL | 3 refills | Status: DC

## 2022-05-28 ENCOUNTER — Ambulatory Visit
Admission: EM | Admit: 2022-05-28 | Discharge: 2022-05-28 | Disposition: A | Discharge status: Home / Self Care | Payer: Medicare HMO | Attending: Emergency Medicine | Admitting: Emergency Medicine

## 2022-05-28 ENCOUNTER — Encounter: Payer: Self-pay | Admitting: Emergency Medicine

## 2022-05-28 ENCOUNTER — Other Ambulatory Visit: Payer: Self-pay

## 2022-05-28 DIAGNOSIS — J069 Acute upper respiratory infection, unspecified: Secondary | ICD-10-CM | POA: Diagnosis not present

## 2022-05-28 LAB — POCT RAPID STREP A (OFFICE): Rapid Strep A Screen: NEGATIVE

## 2022-05-28 MED ORDER — AMOXICILLIN 500 MG PO CAPS: 500.0000 mg | ORAL_CAPSULE | Freq: Two times a day (BID) | ORAL | 0 refills | Status: AC

## 2022-05-28 NOTE — Discharge Instructions (Signed)
Rapid strep test was negative for Streptococcus but as her symptoms have been present for 7 days and are worsening we will provide coverage for additional bacteria  Begin amoxicillin every morning and every evening for 7 days, daily you will start to see improvement in about 48 hours and steady progression from there    You can take Tylenol and/or Ibuprofen as needed for fever reduction and pain relief.   For cough: honey 1/2 to 1 teaspoon (you can dilute the honey in water or another fluid).  You can also use guaifenesin and dextromethorphan for cough. You can use a humidifier for chest congestion and cough.  If you don't have a humidifier, you can sit in the bathroom with the hot shower running.      For sore throat: try warm salt water gargles, cepacol lozenges, throat spray, warm tea or water with lemon/honey, popsicles or ice, or OTC cold relief medicine for throat discomfort.   For congestion: take a daily anti-histamine like Zyrtec, Claritin, and a oral decongestant, such as pseudoephedrine.  You can also use Flonase 1-2 sprays in each nostril daily.   It is important to stay hydrated: drink plenty of fluids (water, gatorade/powerade/pedialyte, juices, or teas) to keep your throat moisturized and help further relieve irritation/discomfort.

## 2022-05-28 NOTE — ED Provider Notes (Signed)
[facility]    CSN: [id number] Arrival date & time: 05/28/22  0934      History   Chief Complaint Chief Complaint  Patient presents with   Sore Throat    HPI Jessica Pacheco is a 47 y.o. female.   Patient presents for evaluation of a sore throat beginning 7 days ago, primarily to the left side at the base of the tongue which she believed to be a canker sore, symptoms worsen severely over the last 3 days, began to experience pain with swallowing, interference with sleep and chest discomfort only when drinking fluids.  Associated nasal congestion, rhinorrhea and a cough.  Since COVID in 2020 02/2020 has experienced shortness of breath with exertion which worsens during illness, has begun to experience this symptom.  Denies presence of fever.  Has attempted use of guaifenesin which has been minimally effective.    Past Medical History:  Diagnosis Date   Anxiety    Chronic right lower quadrant pain    DDD (degenerative disc disease), cervical    c6-7   Depression    Dyspnea    On exertion at times   GERD (gastroesophageal reflux disease)    WATCHES DIET   History of hypoglycemia    History of kidney stones    passed spontaneously   Hypertension    Hypothyroidism    OA (osteoarthritis)    knees , hips, feet   PONV (postoperative nausea and vomiting)    Rheumatoid arthritis(714.0) dx  2001  multiple sites   rheumotologist-  dr Molly Madurorobert gay w/ [facility]--- currently treated w/ oral arava and iv actemra   Shingles    SUI (stress urinary incontinence), female     Patient Active Problem List   Diagnosis Date Noted   Status post total replacement of right hip 07/30/2020   Hypertensive disorder 05/14/2019   Hypothyroidism 05/14/2019   Migraine 05/14/2019   Ovarian endometriosis 05/14/2019   S/P right knee arthroscopy 02/07/18 02/07/2018   Meniscus, lateral, derangement, right    Synovitis of right knee    Arthritis of right knee    Osteoarthritis, multiple sites  01/14/2018   Rheumatoid arthritis of multiple sites with negative rheumatoid factor 05/08/2016   Primary osteoarthritis of both knees 08/30/2015   Primary osteoarthritis of right hip 08/30/2015   Primary osteoarthritis of left foot 06/30/2014   Change in stool 07/27/2011   Contraception 01/02/2011   Rheumatoid arthritis(714.0) 02/21/1999    Past Surgical History:  Procedure Laterality Date   COLONOSCOPY WITH PROPOFOL N/A 10/27/2021   Procedure: COLONOSCOPY WITH PROPOFOL;  Surgeon: Dolores Frameastaneda Mayorga, Daniel, MD;  Location: [facility];  Service: Gastroenterology;  Laterality: N/A;   DILATION AND CURETTAGE OF UTERUS     KNEE ARTHROSCOPY Right 1999   KNEE ARTHROSCOPY WITH MEDIAL MENISECTOMY Right 02/07/2018   Procedure: KNEE ARTHROSCOPY WITH PARTIAL LATERAL MENISECTOMY;  Surgeon: Vickki HearingHarrison, Stanley E, MD;  Location: [facility];  Service: Orthopedics;  Laterality: Right;   LAPAROSCOPY N/A 02/16/2017   Procedure: LAPAROSCOPY DIAGNOSTIC;  Surgeon: Richarda OverlieHolland, Richard, MD;  [facility];  Service: Gynecology;  Laterality: N/A;   TOTAL HIP ARTHROPLASTY Right 07/30/2020   Procedure: RIGHT TOTAL HIP ARTHROPLASTY ANTERIOR APPROACH;  Surgeon: Kathryne HitchBlackman, Christopher Y, MD;  Location: [facility];  Service: Orthopedics;  Laterality: Right;   WISDOM TOOTH EXTRACTION      OB History   No obstetric history on file.      Home Medications    Prior to Admission medications  Medication Sig Start Date End Date Taking? Authorizing Provider  glucosamine-chondroitin 500-400 MG tablet Take 1 tablet by mouth once.   Yes [provider]  rosuvastatin (CRESTOR) 10 MG tablet Take 1 tablet (10 mg total) by mouth daily. 05/22/22 05/17/23  O'NealRonnald Ramp, MD  Turmeric (QC TUMERIC COMPLEX) 500 MG CAPS Take by mouth daily.   Yes [provider]  amLODipine (NORVASC) 10 MG tablet Take 10 mg by mouth at bedtime. 01/07/18   [provider]  Ascorbic Acid (VITAMIN C PO)  Take 1 tablet by mouth at bedtime.    [provider]  escitalopram (LEXAPRO) 20 MG tablet Take 20 mg by mouth in the morning. 09/17/21   [provider]  hydrochlorothiazide (HYDRODIURIL) 25 MG tablet Take 25 mg by mouth daily after breakfast. 04/09/18   [provider]  hydroxychloroquine (PLAQUENIL) 200 MG tablet Take 200 mg by mouth 2 (two) times daily. 02/11/20   [provider]  ibuprofen (ADVIL) 200 MG tablet Take 800 mg by mouth every 8 (eight) hours as needed (pain.).    [provider]  JUNEL FE 1/20 1-20 MG-MCG tablet Take 1 tablet by mouth every evening. Continuous (only takes active pills) 09/17/21   [provider]  levothyroxine (SYNTHROID) 100 MCG tablet Take 100 mcg by mouth daily before breakfast. 06/24/19   [provider]  loratadine (CLARITIN) 10 MG tablet Take 10 mg by mouth daily as needed for allergies.    [provider]  LORazepam (ATIVAN) 0.5 MG tablet Take 0.5 mg by mouth daily as needed for anxiety. 10/15/21   [provider]  melatonin 3 MG TABS tablet Take 3 mg by mouth at bedtime as needed (sleep).    [provider]  metoprolol tartrate (LOPRESSOR) 25 MG tablet TAKE 1 TABLET TWICE A DAY 03/13/22   O'Neal, Ronnald Ramp, MD  oxyCODONE (OXY IR/ROXICODONE) 5 MG immediate release tablet Take 1 tablet (5 mg total) by mouth every 6 (six) hours as needed for moderate pain (pain score 4-6). 09/09/20   Kathryne Hitch, MD  potassium chloride SA (KLOR-CON M) 20 MEQ tablet Take 2 tablets (40 mEq total) by mouth daily for 4 days. 10/21/21 11/10/21  Dolores Frame, MD  Upadacitinib ER (RINVOQ) 15 MG TB24 Take 15 mg by mouth in the morning. 08/31/21   [provider]    Family History Family History  Problem Relation Age of Onset   Colon cancer Father    Colon cancer Paternal Aunt    Heart disease Maternal Grandmother    Heart disease Maternal Grandfather     Social  History Social History   Tobacco Use   Smoking status: Never   Smokeless tobacco: Never  Vaping Use   Vaping Use: Never used  Substance Use Topics   Alcohol use: No   Drug use: No     Allergies   Patient has no known allergies.   Review of Systems Review of Systems  Constitutional: Negative.   HENT:  Positive for congestion, rhinorrhea and sore throat. Negative for dental problem, drooling, ear discharge, ear pain, facial swelling, hearing loss, mouth sores, nosebleeds, postnasal drip, sinus pressure, sinus pain, sneezing, tinnitus, trouble swallowing and voice change.   Respiratory:  Positive for cough and shortness of breath. Negative for apnea, choking, chest tightness, wheezing and stridor.   Cardiovascular: Negative.   Gastrointestinal: Negative.   Skin: Negative.   Neurological: Negative.      Physical Exam Triage Vital Signs  ED Triage Vitals  Enc Vitals Group     BP 05/28/22 0946 118/77     Pulse Rate 05/28/22 0946 79     Resp 05/28/22 0946 20     Temp 05/28/22 0946 98 F (36.7 C)     Temp Source 05/28/22 0946 Oral     SpO2 05/28/22 0946 98 %     Weight --      Height --      Head Circumference --      Peak Flow --      Pain Score 05/28/22 0943 7     Pain Loc --      Pain Edu? --      Excl. in GC? --    No data found.  Updated Vital Signs BP 118/77 (BP Location: Right Arm)   Pulse 79   Temp 98 F (36.7 C) (Oral)   Resp 20   SpO2 98%   Visual Acuity Right Eye Distance:   Left Eye Distance:   Bilateral Distance:    Right Eye Near:   Left Eye Near:    Bilateral Near:     Physical Exam Constitutional:      Appearance: She is well-developed.  HENT:     Head: Normocephalic.     Right Ear: Tympanic membrane and ear canal normal.     Left Ear: Tympanic membrane and ear canal normal.     Nose: Congestion and rhinorrhea present.     Mouth/Throat:     Mouth: Mucous membranes are moist.     Pharynx: Posterior oropharyngeal erythema present.      Tonsils: No tonsillar exudate. 0 on the right. 0 on the left.  Cardiovascular:     Rate and Rhythm: Normal rate and regular rhythm.     Heart sounds: Normal heart sounds.  Pulmonary:     Effort: Pulmonary effort is normal.     Breath sounds: Normal breath sounds.  Musculoskeletal:     Cervical back: Normal range of motion.  Lymphadenopathy:     Cervical: Cervical adenopathy present.  Skin:    General: Skin is warm and dry.  Neurological:     General: No focal deficit present.     Mental Status: She is alert and oriented to person, place, and time.  Psychiatric:        Mood and Affect: Mood normal.        Behavior: Behavior normal.      UC Treatments / Results  Labs (all labs ordered are listed, but only abnormal results are displayed) Labs Reviewed  POCT RAPID STREP A (OFFICE)    EKG   Radiology No results found.  Procedures Procedures (including critical care time)  Medications Ordered in UC Medications - No data to display  Initial Impression / Assessment and Plan / UC Course  I have reviewed the triage vital signs and the nursing notes.  Pertinent labs & imaging results that were available during my care of the patient were reviewed by me and considered in my medical decision making (see chart for details).  Acute URI  Patient is in no signs of distress nor toxic appearing.  Vital signs are stable.  Low suspicion for pneumonia, pneumothorax or bronchitis and therefore will defer imaging.  Viral testing deferred due to timeline of illness.  Rapid strep test negative however as she has been symptomatic for 7 days with worsening symptoms we will provide bacterial coverage, amoxicillin prescribed.May use additional over-the-counter medications as needed for supportive care.  May follow-up with urgent care as needed if symptoms persist or worsen.  Note given.      Final Clinical Impressions(s) / UC Diagnoses   Final diagnoses:  None   Discharge Instructions    None    ED Prescriptions   None    PDMP not reviewed this encounter.   Valinda Hoar, NP 05/28/22 1031

## 2022-05-28 NOTE — ED Triage Notes (Addendum)
Pt reports sore throat, cough, congestion, headache for last several days. Pt reports has been taking otc mucinex which helped with nasal congestion. Pt reports increased pain with swallowing since last night. Denies fever.  Home covid test negative last night.

## 2022-06-02 DIAGNOSIS — J069 Acute upper respiratory infection, unspecified: Secondary | ICD-10-CM | POA: Diagnosis not present

## 2022-06-02 DIAGNOSIS — I10 Essential (primary) hypertension: Secondary | ICD-10-CM | POA: Diagnosis not present

## 2022-06-02 DIAGNOSIS — B0223 Postherpetic polyneuropathy: Secondary | ICD-10-CM | POA: Diagnosis not present

## 2022-06-07 DIAGNOSIS — M9902 Segmental and somatic dysfunction of thoracic region: Secondary | ICD-10-CM | POA: Diagnosis not present

## 2022-06-07 DIAGNOSIS — M9903 Segmental and somatic dysfunction of lumbar region: Secondary | ICD-10-CM | POA: Diagnosis not present

## 2022-06-07 DIAGNOSIS — S338XXA Sprain of other parts of lumbar spine and pelvis, initial encounter: Secondary | ICD-10-CM | POA: Diagnosis not present

## 2022-06-07 DIAGNOSIS — S233XXA Sprain of ligaments of thoracic spine, initial encounter: Secondary | ICD-10-CM | POA: Diagnosis not present

## 2022-07-11 DIAGNOSIS — S338XXA Sprain of other parts of lumbar spine and pelvis, initial encounter: Secondary | ICD-10-CM | POA: Diagnosis not present

## 2022-07-11 DIAGNOSIS — M9903 Segmental and somatic dysfunction of lumbar region: Secondary | ICD-10-CM | POA: Diagnosis not present

## 2022-07-11 DIAGNOSIS — S233XXA Sprain of ligaments of thoracic spine, initial encounter: Secondary | ICD-10-CM | POA: Diagnosis not present

## 2022-07-11 DIAGNOSIS — M9902 Segmental and somatic dysfunction of thoracic region: Secondary | ICD-10-CM | POA: Diagnosis not present

## 2022-07-26 ENCOUNTER — Ambulatory Visit: Payer: Medicare HMO | Admitting: Orthopedic Surgery

## 2022-07-27 ENCOUNTER — Ambulatory Visit: Payer: Medicare HMO | Admitting: Orthopedic Surgery

## 2022-07-27 ENCOUNTER — Encounter: Payer: Self-pay | Admitting: Orthopedic Surgery

## 2022-07-27 ENCOUNTER — Other Ambulatory Visit (INDEPENDENT_AMBULATORY_CARE_PROVIDER_SITE_OTHER): Payer: Medicare HMO

## 2022-07-27 VITALS — BP 112/67 | HR 76 | Ht 66.0 in | Wt 240.0 lb

## 2022-07-27 DIAGNOSIS — Z96641 Presence of right artificial hip joint: Secondary | ICD-10-CM

## 2022-07-27 DIAGNOSIS — M48061 Spinal stenosis, lumbar region without neurogenic claudication: Secondary | ICD-10-CM

## 2022-07-27 DIAGNOSIS — M0609 Rheumatoid arthritis without rheumatoid factor, multiple sites: Secondary | ICD-10-CM

## 2022-07-27 DIAGNOSIS — M1711 Unilateral primary osteoarthritis, right knee: Secondary | ICD-10-CM

## 2022-07-27 DIAGNOSIS — G8929 Other chronic pain: Secondary | ICD-10-CM

## 2022-07-27 NOTE — Progress Notes (Signed)
Chief Complaint  Patient presents with   Follow-up    Recheck on right knee    47 year old female rheumatology status post right knee arthroscopy comes in for annual x-rays of her right knee which show osteoarthritis especially of the lateral compartment  Orthopedic issues right knee arthroscopy 2019 lower back pain with sciatica status post total hip on the right  Symptoms popping locking since celebrex stopped (CR went up )   BP 112/67   Pulse 76   Ht 5\' 6"  (1.676 m)   Wt 240 lb (108.9 kg)   BMI 38.74 kg/m    Encounter Diagnoses  Name Primary?   Chronic pain of right knee Yes   Rheumatoid arthritis of multiple sites with negative rheumatoid factor (HCC)    Status post total replacement of right hip    Foraminal stenosis of lumbar region    Primary osteoarthritis of right knee    Right Knee Exam   Muscle Strength  The patient has normal right knee strength.  Tenderness  The patient is experiencing tenderness in the medial joint line and lateral joint line.  Range of Motion  Extension:  normal  Flexion:  normal   Tests  McMurray:  Medial - negative  Drawer:  Anterior - trace    Posterior - negative  Other  Erythema: absent Scars: absent Sensation: normal Pulse: present Swelling: none Effusion: no effusion present    Jessica Pacheco is doing well with her knee.  She has got some symptoms after they took her off Celebrex but her knee x-ray does not indicate she needs to proceed with arthroplasty  Follow-up in a year

## 2022-08-01 DIAGNOSIS — M9902 Segmental and somatic dysfunction of thoracic region: Secondary | ICD-10-CM | POA: Diagnosis not present

## 2022-08-01 DIAGNOSIS — S338XXA Sprain of other parts of lumbar spine and pelvis, initial encounter: Secondary | ICD-10-CM | POA: Diagnosis not present

## 2022-08-01 DIAGNOSIS — M9903 Segmental and somatic dysfunction of lumbar region: Secondary | ICD-10-CM | POA: Diagnosis not present

## 2022-08-01 DIAGNOSIS — S233XXA Sprain of ligaments of thoracic spine, initial encounter: Secondary | ICD-10-CM | POA: Diagnosis not present

## 2022-08-03 ENCOUNTER — Other Ambulatory Visit: Payer: Self-pay | Admitting: Cardiovascular Disease

## 2022-08-13 ENCOUNTER — Encounter: Payer: Self-pay | Admitting: Physical Medicine and Rehabilitation

## 2022-08-14 ENCOUNTER — Telehealth: Payer: Self-pay

## 2022-08-14 DIAGNOSIS — M5416 Radiculopathy, lumbar region: Secondary | ICD-10-CM

## 2022-08-14 NOTE — Telephone Encounter (Signed)
Order for right L3-4 IL placed

## 2022-08-17 DIAGNOSIS — B070 Plantar wart: Secondary | ICD-10-CM | POA: Diagnosis not present

## 2022-08-22 DIAGNOSIS — E785 Hyperlipidemia, unspecified: Secondary | ICD-10-CM | POA: Diagnosis not present

## 2022-08-22 DIAGNOSIS — M9903 Segmental and somatic dysfunction of lumbar region: Secondary | ICD-10-CM | POA: Diagnosis not present

## 2022-08-22 DIAGNOSIS — S233XXA Sprain of ligaments of thoracic spine, initial encounter: Secondary | ICD-10-CM | POA: Diagnosis not present

## 2022-08-22 DIAGNOSIS — S338XXA Sprain of other parts of lumbar spine and pelvis, initial encounter: Secondary | ICD-10-CM | POA: Diagnosis not present

## 2022-08-22 DIAGNOSIS — M9902 Segmental and somatic dysfunction of thoracic region: Secondary | ICD-10-CM | POA: Diagnosis not present

## 2022-08-22 DIAGNOSIS — Z79899 Other long term (current) drug therapy: Secondary | ICD-10-CM | POA: Diagnosis not present

## 2022-08-28 ENCOUNTER — Telehealth: Payer: Self-pay | Admitting: Physical Medicine and Rehabilitation

## 2022-08-28 DIAGNOSIS — I10 Essential (primary) hypertension: Secondary | ICD-10-CM | POA: Diagnosis not present

## 2022-08-28 DIAGNOSIS — B0223 Postherpetic polyneuropathy: Secondary | ICD-10-CM | POA: Diagnosis not present

## 2022-08-28 DIAGNOSIS — I2583 Coronary atherosclerosis due to lipid rich plaque: Secondary | ICD-10-CM | POA: Diagnosis not present

## 2022-08-28 NOTE — Telephone Encounter (Signed)
Pt called to set an appt with Dr Alvester Morin. Please call pt at [phone].

## 2022-08-29 NOTE — Telephone Encounter (Signed)
See MyChart message sent to patient.

## 2022-08-30 ENCOUNTER — Telehealth: Payer: Self-pay | Admitting: *Deleted

## 2022-08-30 NOTE — Telephone Encounter (Signed)
-----   Message from Sande Rives, MD sent at 08/29/2022  3:17 PM EDT ----- Cholesterol level is acceptable.  I will send a MyChart message.  Gerri Spore T. Flora Lipps, MD, [facility]  [facility]  [address], [address] [place], [place] [address] [phone]  3:17 PM

## 2022-08-30 NOTE — Telephone Encounter (Signed)
Result released to MyChart. Patient has not reviewed.  Called left a detail result message on secure voicemail per DPR .  Labs were obtain from primary .  Any question may call office back

## 2022-09-04 DIAGNOSIS — I471 Supraventricular tachycardia, unspecified: Secondary | ICD-10-CM | POA: Diagnosis not present

## 2022-09-04 DIAGNOSIS — R32 Unspecified urinary incontinence: Secondary | ICD-10-CM | POA: Diagnosis not present

## 2022-09-04 DIAGNOSIS — Z008 Encounter for other general examination: Secondary | ICD-10-CM | POA: Diagnosis not present

## 2022-09-04 DIAGNOSIS — M1990 Unspecified osteoarthritis, unspecified site: Secondary | ICD-10-CM | POA: Diagnosis not present

## 2022-09-04 DIAGNOSIS — I129 Hypertensive chronic kidney disease with stage 1 through stage 4 chronic kidney disease, or unspecified chronic kidney disease: Secondary | ICD-10-CM | POA: Diagnosis not present

## 2022-09-04 DIAGNOSIS — F325 Major depressive disorder, single episode, in full remission: Secondary | ICD-10-CM | POA: Diagnosis not present

## 2022-09-04 DIAGNOSIS — I2510 Atherosclerotic heart disease of native coronary artery without angina pectoris: Secondary | ICD-10-CM | POA: Diagnosis not present

## 2022-09-04 DIAGNOSIS — K219 Gastro-esophageal reflux disease without esophagitis: Secondary | ICD-10-CM | POA: Diagnosis not present

## 2022-09-04 DIAGNOSIS — M5440 Lumbago with sciatica, unspecified side: Secondary | ICD-10-CM | POA: Diagnosis not present

## 2022-09-04 DIAGNOSIS — F411 Generalized anxiety disorder: Secondary | ICD-10-CM | POA: Diagnosis not present

## 2022-09-04 DIAGNOSIS — E039 Hypothyroidism, unspecified: Secondary | ICD-10-CM | POA: Diagnosis not present

## 2022-09-04 DIAGNOSIS — M4800 Spinal stenosis, site unspecified: Secondary | ICD-10-CM | POA: Diagnosis not present

## 2022-09-04 DIAGNOSIS — B0223 Postherpetic polyneuropathy: Secondary | ICD-10-CM | POA: Diagnosis not present

## 2022-09-07 DIAGNOSIS — M170 Bilateral primary osteoarthritis of knee: Secondary | ICD-10-CM | POA: Diagnosis not present

## 2022-09-07 DIAGNOSIS — M0609 Rheumatoid arthritis without rheumatoid factor, multiple sites: Secondary | ICD-10-CM | POA: Diagnosis not present

## 2022-09-07 DIAGNOSIS — M19072 Primary osteoarthritis, left ankle and foot: Secondary | ICD-10-CM | POA: Diagnosis not present

## 2022-09-07 DIAGNOSIS — Z79899 Other long term (current) drug therapy: Secondary | ICD-10-CM | POA: Diagnosis not present

## 2022-09-12 DIAGNOSIS — B070 Plantar wart: Secondary | ICD-10-CM | POA: Diagnosis not present

## 2022-09-14 DIAGNOSIS — M9903 Segmental and somatic dysfunction of lumbar region: Secondary | ICD-10-CM | POA: Diagnosis not present

## 2022-09-14 DIAGNOSIS — S338XXA Sprain of other parts of lumbar spine and pelvis, initial encounter: Secondary | ICD-10-CM | POA: Diagnosis not present

## 2022-09-14 DIAGNOSIS — S233XXA Sprain of ligaments of thoracic spine, initial encounter: Secondary | ICD-10-CM | POA: Diagnosis not present

## 2022-09-14 DIAGNOSIS — M9902 Segmental and somatic dysfunction of thoracic region: Secondary | ICD-10-CM | POA: Diagnosis not present

## 2022-09-21 ENCOUNTER — Ambulatory Visit: Payer: Medicare HMO | Admitting: Physical Medicine and Rehabilitation

## 2022-09-21 ENCOUNTER — Other Ambulatory Visit: Payer: Self-pay

## 2022-09-21 VITALS — BP 125/72 | HR 85

## 2022-09-21 DIAGNOSIS — M5416 Radiculopathy, lumbar region: Secondary | ICD-10-CM

## 2022-09-21 MED ORDER — METHYLPREDNISOLONE ACETATE 80 MG/ML IJ SUSP
80.0000 mg | Freq: Once | INTRAMUSCULAR | Status: AC
  Administered 2022-09-21: 80 mg

## 2022-09-21 NOTE — Patient Instructions (Signed)

## 2022-09-21 NOTE — Progress Notes (Signed)
Functional Pain Scale - descriptive words and definitions  Moderate (4)   Constantly aware of pain, can complete ADLs with modification/sleep marginally affected at times/passive distraction is of no use, but active distraction gives some relief. Moderate range order  Average Pain 6   +Driver, -BT, -Dye Allergies.  Lower back pain on right side that radiates to the back of the right leg around to the front of mid thigh

## 2022-10-02 NOTE — Progress Notes (Signed)
Jessica Pacheco - 47 y.o. female MRN [id number]  Date of birth: 08-14-1975  Office Visit Note: Visit Date: 09/21/2022 PCP: Carylon Perches, MD Referred by: Carylon Perches, MD  Subjective: Chief Complaint  Patient presents with   Lower Back - Pain   HPI:  Jessica Pacheco is a 47 y.o. female who comes in today for planned repeat Right L3-4  Lumbar Interlaminar epidural steroid injection with fluoroscopic guidance.  The patient has failed conservative care including home exercise, medications, time and activity modification.  This injection will be diagnostic and hopefully therapeutic.  Please see requesting physician notes for further details and justification. Patient received more than 50% pain relief from prior injection.   Referring: Dr. Doneen Poisson   ROS Otherwise per HPI.  Assessment & Plan: Visit Diagnoses:    ICD-10-CM   1. Lumbar radiculopathy  M54.16 XR C-ARM NO REPORT    Epidural Steroid injection    methylPREDNISolone acetate (DEPO-MEDROL) injection 80 mg      Plan: No additional findings.   Meds & Orders:  Meds ordered this encounter  Medications   methylPREDNISolone acetate (DEPO-MEDROL) injection 80 mg    Orders Placed This Encounter  Procedures   XR C-ARM NO REPORT   Epidural Steroid injection    Follow-up: Return for visit to requesting provider as needed.   Procedures: No procedures performed  Lumbar Epidural Steroid Injection - Interlaminar Approach with Fluoroscopic Guidance  Patient: Jessica Pacheco      Date of Birth: 1976-01-22 MRN: [id number] PCP: Carylon Perches, MD      Visit Date: 09/21/2022   Universal Protocol:     Consent Given By: the patient  Position: PRONE  Additional Comments: Vital signs were monitored before and after the procedure. Patient was prepped and draped in the usual sterile fashion. The correct patient, procedure, and site was verified.   Injection Procedure Details:   Procedure diagnoses: Lumbar radiculopathy  [M54.16]   Meds Administered:  Meds ordered this encounter  Medications   methylPREDNISolone acetate (DEPO-MEDROL) injection 80 mg     Laterality: Right  Location/Site:  L3-4  Needle: 3.5 in., 20 ga. Tuohy  Needle Placement: Paramedian epidural  Findings:   -Comments: Excellent flow of contrast into the epidural space.  Procedure Details: Using a paramedian approach from the side mentioned above, the region overlying the inferior lamina was localized under fluoroscopic visualization and the soft tissues overlying this structure were infiltrated with 4 ml. of 1% Lidocaine without Epinephrine. The Tuohy needle was inserted into the epidural space using a paramedian approach.   The epidural space was localized using loss of resistance along with counter oblique bi-planar fluoroscopic views.  After negative aspirate for air, blood, and CSF, a 2 ml. volume of Isovue-250 was injected into the epidural space and the flow of contrast was observed. Radiographs were obtained for documentation purposes.    The injectate was administered into the level noted above.   Additional Comments:  No complications occurred Dressing: 2 x 2 sterile gauze and Band-Aid    Post-procedure details: Patient was observed during the procedure. Post-procedure instructions were reviewed.  Patient left the clinic in stable condition.   Clinical History: MRI LUMBAR SPINE WITHOUT CONTRAST   TECHNIQUE: Multiplanar, multisequence MR imaging of the lumbar spine was performed. No intravenous contrast was administered.   COMPARISON:  Lumbar spine x-rays dated December 12, 2021.   FINDINGS: Segmentation:  Standard.   Alignment: 6 mm anterolisthesis at L3-L4 due to bilateral L3 pars  defects.   Vertebrae: No fracture, evidence of discitis, or bone lesion. Asymmetric right-sided degenerative endplate marrow edema at L3-L4.   Conus medullaris and cauda equina: Conus extends to the T12-L1 level. Conus and  cauda equina appear normal.   Paraspinal and other soft tissues: Negative.   Disc levels:   T11-T12 to L2-L3: Negative.   L3-L4: Disc uncovering and mild disc bulging eccentric to the right. Mild right lateral recess and bilateral neuroforaminal stenosis. No spinal canal stenosis.   L4-L5:  Negative.   L5-S1: Negative disc. Mild bilateral facet arthropathy. No stenosis.   IMPRESSION: 1. Grade 1 anterolisthesis at L3-L4 due to bilateral L3 pars defects. Mild right lateral recess and bilateral neuroforaminal stenosis at this level.     Electronically Signed   By: Obie Dredge M.D.   On: 12/23/2021 14:21     Objective:  VS:  HT:    WT:   BMI:     BP:125/72  HR:85bpm  TEMP: ( )  RESP:  Physical Exam Vitals and nursing note reviewed.  Constitutional:      General: She is not in acute distress.    Appearance: Normal appearance. She is not ill-appearing.  HENT:     Head: Normocephalic and atraumatic.     Right Ear: External ear normal.     Left Ear: External ear normal.  Eyes:     Extraocular Movements: Extraocular movements intact.  Cardiovascular:     Rate and Rhythm: Normal rate.     Pulses: Normal pulses.  Pulmonary:     Effort: Pulmonary effort is normal. No respiratory distress.  Abdominal:     General: There is no distension.     Palpations: Abdomen is soft.  Musculoskeletal:        General: Tenderness present.     Cervical back: Neck supple.     Right lower leg: No edema.     Left lower leg: No edema.     Comments: Patient has good distal strength with no pain over the greater trochanters.  No clonus or focal weakness.  Skin:    Findings: No erythema, lesion or rash.  Neurological:     General: No focal deficit present.     Mental Status: She is alert and oriented to person, place, and time.     Sensory: No sensory deficit.     Motor: No weakness or abnormal muscle tone.     Coordination: Coordination normal.  Psychiatric:        Mood and  Affect: Mood normal.        Behavior: Behavior normal.      Imaging: No results found.

## 2022-10-02 NOTE — Procedures (Signed)
Lumbar Epidural Steroid Injection - Interlaminar Approach with Fluoroscopic Guidance  Patient: Jessica Pacheco      Date of Birth: February 19, 1976 MRN: [id number] PCP: Carylon Perches, MD      Visit Date: 09/21/2022   Universal Protocol:     Consent Given By: the patient  Position: PRONE  Additional Comments: Vital signs were monitored before and after the procedure. Patient was prepped and draped in the usual sterile fashion. The correct patient, procedure, and site was verified.   Injection Procedure Details:   Procedure diagnoses: Lumbar radiculopathy [M54.16]   Meds Administered:  Meds ordered this encounter  Medications   methylPREDNISolone acetate (DEPO-MEDROL) injection 80 mg     Laterality: Right  Location/Site:  L3-4  Needle: 3.5 in., 20 ga. Tuohy  Needle Placement: Paramedian epidural  Findings:   -Comments: Excellent flow of contrast into the epidural space.  Procedure Details: Using a paramedian approach from the side mentioned above, the region overlying the inferior lamina was localized under fluoroscopic visualization and the soft tissues overlying this structure were infiltrated with 4 ml. of 1% Lidocaine without Epinephrine. The Tuohy needle was inserted into the epidural space using a paramedian approach.   The epidural space was localized using loss of resistance along with counter oblique bi-planar fluoroscopic views.  After negative aspirate for air, blood, and CSF, a 2 ml. volume of Isovue-250 was injected into the epidural space and the flow of contrast was observed. Radiographs were obtained for documentation purposes.    The injectate was administered into the level noted above.   Additional Comments:  No complications occurred Dressing: 2 x 2 sterile gauze and Band-Aid    Post-procedure details: Patient was observed during the procedure. Post-procedure instructions were reviewed.  Patient left the clinic in stable condition.

## 2022-10-19 DIAGNOSIS — B070 Plantar wart: Secondary | ICD-10-CM | POA: Diagnosis not present

## 2022-10-26 DIAGNOSIS — M9903 Segmental and somatic dysfunction of lumbar region: Secondary | ICD-10-CM | POA: Diagnosis not present

## 2022-10-26 DIAGNOSIS — S338XXA Sprain of other parts of lumbar spine and pelvis, initial encounter: Secondary | ICD-10-CM | POA: Diagnosis not present

## 2022-10-26 DIAGNOSIS — S233XXA Sprain of ligaments of thoracic spine, initial encounter: Secondary | ICD-10-CM | POA: Diagnosis not present

## 2022-10-26 DIAGNOSIS — M9902 Segmental and somatic dysfunction of thoracic region: Secondary | ICD-10-CM | POA: Diagnosis not present

## 2022-11-14 ENCOUNTER — Other Ambulatory Visit: Payer: Self-pay | Admitting: Cardiovascular Disease

## 2022-11-14 DIAGNOSIS — B070 Plantar wart: Secondary | ICD-10-CM | POA: Diagnosis not present

## 2022-11-16 DIAGNOSIS — M9903 Segmental and somatic dysfunction of lumbar region: Secondary | ICD-10-CM | POA: Diagnosis not present

## 2022-11-16 DIAGNOSIS — S338XXA Sprain of other parts of lumbar spine and pelvis, initial encounter: Secondary | ICD-10-CM | POA: Diagnosis not present

## 2022-11-16 DIAGNOSIS — M9902 Segmental and somatic dysfunction of thoracic region: Secondary | ICD-10-CM | POA: Diagnosis not present

## 2022-11-16 DIAGNOSIS — S233XXA Sprain of ligaments of thoracic spine, initial encounter: Secondary | ICD-10-CM | POA: Diagnosis not present

## 2022-11-21 DIAGNOSIS — Z79899 Other long term (current) drug therapy: Secondary | ICD-10-CM | POA: Diagnosis not present

## 2022-11-30 DIAGNOSIS — M9903 Segmental and somatic dysfunction of lumbar region: Secondary | ICD-10-CM | POA: Diagnosis not present

## 2022-11-30 DIAGNOSIS — S338XXA Sprain of other parts of lumbar spine and pelvis, initial encounter: Secondary | ICD-10-CM | POA: Diagnosis not present

## 2022-11-30 DIAGNOSIS — S233XXA Sprain of ligaments of thoracic spine, initial encounter: Secondary | ICD-10-CM | POA: Diagnosis not present

## 2022-11-30 DIAGNOSIS — M9902 Segmental and somatic dysfunction of thoracic region: Secondary | ICD-10-CM | POA: Diagnosis not present

## 2022-12-05 DIAGNOSIS — B070 Plantar wart: Secondary | ICD-10-CM | POA: Diagnosis not present

## 2022-12-08 ENCOUNTER — Encounter: Payer: Self-pay | Admitting: Physical Medicine and Rehabilitation

## 2022-12-08 DIAGNOSIS — H9201 Otalgia, right ear: Secondary | ICD-10-CM | POA: Diagnosis not present

## 2022-12-14 DIAGNOSIS — M9902 Segmental and somatic dysfunction of thoracic region: Secondary | ICD-10-CM | POA: Diagnosis not present

## 2022-12-14 DIAGNOSIS — M9903 Segmental and somatic dysfunction of lumbar region: Secondary | ICD-10-CM | POA: Diagnosis not present

## 2022-12-14 DIAGNOSIS — S233XXA Sprain of ligaments of thoracic spine, initial encounter: Secondary | ICD-10-CM | POA: Diagnosis not present

## 2022-12-14 DIAGNOSIS — S338XXA Sprain of other parts of lumbar spine and pelvis, initial encounter: Secondary | ICD-10-CM | POA: Diagnosis not present

## 2022-12-18 ENCOUNTER — Encounter: Payer: Self-pay | Admitting: Physical Medicine and Rehabilitation

## 2022-12-18 ENCOUNTER — Ambulatory Visit: Payer: Medicare HMO | Admitting: Physical Medicine and Rehabilitation

## 2022-12-18 DIAGNOSIS — M7918 Myalgia, other site: Secondary | ICD-10-CM | POA: Diagnosis not present

## 2022-12-18 DIAGNOSIS — G8929 Other chronic pain: Secondary | ICD-10-CM | POA: Diagnosis not present

## 2022-12-18 DIAGNOSIS — M5441 Lumbago with sciatica, right side: Secondary | ICD-10-CM

## 2022-12-18 DIAGNOSIS — M5416 Radiculopathy, lumbar region: Secondary | ICD-10-CM | POA: Diagnosis not present

## 2022-12-18 DIAGNOSIS — M4316 Spondylolisthesis, lumbar region: Secondary | ICD-10-CM

## 2022-12-18 DIAGNOSIS — M5442 Lumbago with sciatica, left side: Secondary | ICD-10-CM

## 2022-12-18 DIAGNOSIS — M4306 Spondylolysis, lumbar region: Secondary | ICD-10-CM

## 2022-12-18 NOTE — Progress Notes (Unsigned)
Functional Pain Scale - descriptive words and definitions  Distressing (6)    Pain is present/unable to complete most ADLs limited by pain/sleep is difficult and active distraction is only marginal. Moderate range order  Average Pain 7  Lower back pain above the buttocks on both sides

## 2022-12-18 NOTE — Progress Notes (Unsigned)
Jessica Pacheco - 47 y.o. female MRN [id number]  Date of birth: 1975-11-10  Office Visit Note: Visit Date: 12/18/2022 PCP: Carylon Perches, MD Referred by: Carylon Perches, MD  Subjective: Chief Complaint  Patient presents with   Lower Back - Pain   HPI: Jessica Pacheco is a 47 y.o. female who comes in today for evaluation of chronic, worsening and severe bilateral lower back pain radiating to buttocks, right greater than left. Pain ongoing for several years, worsens with prolonged sitting and activity.. She describes pain as aching and sharp sensation, currently rates as 7 out of 10. Some relief of pain with home exercise regimen, rest and medications. She is currently undergoing chiropractic treatments with Dr. Dr. Charissa Bash with [facility], she reports significant relief of pain with dry needling. Lumbar MRI imaging from 2023 exhibits grade 1 anterolisthesis at L3-L4 due to bilateral L3 pars defects.There is also mild right lateral recess and bilateral foraminal stenosis at this level. No high grade spinal canal stenosis. History of right L3-L4 interlaminar epidural steroid injection in our office on 01/17/2022, she reports significant relief of pain for 3 months, greater than 50% relief of pain with this injection. We repeated this injection on 09/21/2022, states she was diagnosed with COVID 3 days post injection that caused her to lay in bed for many days. She reports this injection helped her pain but only lasted a short period of time. Patient denies focal weakness, numbness and tingling. No recent trauma or falls.     Review of Systems  Musculoskeletal:  Positive for back pain and myalgias.  Neurological:  Negative for tingling, sensory change, focal weakness and weakness.  All other systems reviewed and are negative.  Otherwise per HPI.  Assessment & Plan: Visit Diagnoses:    ICD-10-CM   1. Chronic bilateral low back pain with bilateral sciatica  M54.42    M54.41    G89.29      2. Lumbar radiculopathy  M54.16     3. Pars defect of lumbar spine  M43.06     4. Anterolisthesis of lumbar spine  M43.16     5. Myofascial pain syndrome  M79.18        Plan: Findings:  Chronic, worsening and severe bilateral lower back pain radiating to buttocks. Patient continues to have severe pain despite good conservative therapies such as chiropractic treatments, home exercise regimen, rest and use of medications. Patients clinical presentation and exam are complex, differentials include lumbar radiculopathy vs myofascial pain syndrome. There is grade 1 anterolisthesis and pars defects at L3-L4. Dr. Alvester Morin and myself compared lumbar MRI T2 sagittal imaging to lateral lumbar radiographs, both from 2023. It appears the anterolisthesis at L3-L4 could be unstable. She does exhibit tenderness noted upon palpation of bilateral lower lumbar paraspinal regions, right greater then left that is consistent with more musculoskeletal involvement. Next step is to perform right L3-L4 interlaminar epidural steroid injection under fluoroscopic guidance. If she gets good relief of pain with injection we can repeat this procedure infrequently as needed. Should her pain persist would consider obtaining flexion/extension films and possible surgical consult with Dr. Willia Craze. Patient has no questions at this time. No red flag symptoms noted upon exam today.     Meds & Orders: No orders of the defined types were placed in this encounter.  No orders of the defined types were placed in this encounter.   Follow-up: Return for Right L3-L4 interlaminar epidural steroid injection.   Procedures: No procedures performed  Clinical History: MRI LUMBAR SPINE WITHOUT CONTRAST   TECHNIQUE: Multiplanar, multisequence MR imaging of the lumbar spine was performed. No intravenous contrast was administered.   COMPARISON:  Lumbar spine x-rays dated December 12, 2021.   FINDINGS: Segmentation:  Standard.    Alignment: 6 mm anterolisthesis at L3-L4 due to bilateral L3 pars defects.   Vertebrae: No fracture, evidence of discitis, or bone lesion. Asymmetric right-sided degenerative endplate marrow edema at L3-L4.   Conus medullaris and cauda equina: Conus extends to the T12-L1 level. Conus and cauda equina appear normal.   Paraspinal and other soft tissues: Negative.   Disc levels:   T11-T12 to L2-L3: Negative.   L3-L4: Disc uncovering and mild disc bulging eccentric to the right. Mild right lateral recess and bilateral neuroforaminal stenosis. No spinal canal stenosis.   L4-L5:  Negative.   L5-S1: Negative disc. Mild bilateral facet arthropathy. No stenosis.   IMPRESSION: 1. Grade 1 anterolisthesis at L3-L4 due to bilateral L3 pars defects. Mild right lateral recess and bilateral neuroforaminal stenosis at this level.     Electronically Signed   By: Obie Dredge M.D.   On: 12/23/2021 14:21   She reports that she has never smoked. She has never used smokeless tobacco. No results for input(s): "HGBA1C", "LABURIC" in the last 8760 hours.  Objective:  VS:  HT:    WT:   BMI:     BP:   HR: bpm  TEMP: ( )  RESP:  Physical Exam Vitals and nursing note reviewed.  HENT:     Head: Normocephalic and atraumatic.     Right Ear: External ear normal.     Left Ear: External ear normal.     Nose: Nose normal.     Mouth/Throat:     Mouth: Mucous membranes are moist.  Eyes:     Extraocular Movements: Extraocular movements intact.  Cardiovascular:     Rate and Rhythm: Normal rate.     Pulses: Normal pulses.  Pulmonary:     Effort: Pulmonary effort is normal.  Abdominal:     General: Abdomen is flat. There is no distension.  Musculoskeletal:        General: Tenderness present.     Cervical back: Normal range of motion.     Comments: Patient rises from seated position to standing without difficulty. Good lumbar range of motion. No pain noted with facet loading. 5/5 strength  noted with bilateral hip flexion, knee flexion/extension, ankle dorsiflexion/plantarflexion and EHL. No clonus noted bilaterally. No pain upon palpation of greater trochanters. No pain with internal/external rotation of bilateral hips. Sensation intact bilaterally. Tenderness and palpable trigger points noted to bilateral lumbar paraspinal regions, right greater than left. Negative slump test bilaterally. Ambulates without aid, gait steady.     Skin:    General: Skin is warm and dry.     Capillary Refill: Capillary refill takes less than 2 seconds.  Neurological:     General: No focal deficit present.     Mental Status: She is alert and oriented to person, place, and time.  Psychiatric:        Mood and Affect: Mood normal.        Behavior: Behavior normal.     Ortho Exam  Imaging: No results found.  Past Medical/Family/Surgical/Social History: Medications & Allergies reviewed per EMR, new medications updated. Patient Active Problem List   Diagnosis Date Noted   Status post total replacement of right hip 07/30/2020   Hypertensive disorder 05/14/2019   Hypothyroidism 05/14/2019  Migraine 05/14/2019   Ovarian endometriosis 05/14/2019   S/P right knee arthroscopy 02/07/18 02/07/2018   Meniscus, lateral, derangement, right    Synovitis of right knee    Arthritis of right knee    Osteoarthritis, multiple sites 01/14/2018   Rheumatoid arthritis of multiple sites with negative rheumatoid factor (HCC) 05/08/2016   Primary osteoarthritis of both knees 08/30/2015   Primary osteoarthritis of right hip 08/30/2015   Primary osteoarthritis of left foot 06/30/2014   Change in stool 07/27/2011   Contraception 01/02/2011   Rheumatoid arthritis (HCC) 02/21/1999   Past Medical History:  Diagnosis Date   Anxiety    Chronic right lower quadrant pain    DDD (degenerative disc disease), cervical    c6-7   Depression    Dyspnea    On exertion at times   GERD (gastroesophageal reflux disease)     WATCHES DIET   History of hypoglycemia    History of kidney stones    passed spontaneously   Hypertension    Hypothyroidism    OA (osteoarthritis)    knees , hips, feet   PONV (postoperative nausea and vomiting)    Rheumatoid arthritis(714.0) dx  2001  multiple sites   rheumotologist-  dr Molly Maduro gay w/ [facility]--- currently treated w/ oral arava and iv actemra   Shingles    SUI (stress urinary incontinence), female    Family History  Problem Relation Age of Onset   Colon cancer Father    Colon cancer Paternal Aunt    Heart disease Maternal Grandmother    Heart disease Maternal Grandfather    Past Surgical History:  Procedure Laterality Date   COLONOSCOPY WITH PROPOFOL N/A 10/27/2021   Procedure: COLONOSCOPY WITH PROPOFOL;  Surgeon: Dolores Frame, MD;  Location: [facility];  Service: Gastroenterology;  Laterality: N/A;   DILATION AND CURETTAGE OF UTERUS     KNEE ARTHROSCOPY Right 1999   KNEE ARTHROSCOPY WITH MEDIAL MENISECTOMY Right 02/07/2018   Procedure: KNEE ARTHROSCOPY WITH PARTIAL LATERAL MENISECTOMY;  Surgeon: Vickki Hearing, MD;  Location: [facility];  Service: Orthopedics;  Laterality: Right;   LAPAROSCOPY N/A 02/16/2017   Procedure: LAPAROSCOPY DIAGNOSTIC;  Surgeon: Richarda Overlie, MD;  Location: [facility];  Service: Gynecology;  Laterality: N/A;   TOTAL HIP ARTHROPLASTY Right 07/30/2020   Procedure: RIGHT TOTAL HIP ARTHROPLASTY ANTERIOR APPROACH;  Surgeon: Kathryne Hitch, MD;  Location: [facility];  Service: Orthopedics;  Laterality: Right;   WISDOM TOOTH EXTRACTION     Social History   Occupational History   Occupation: Disabled  Tobacco Use   Smoking status: Never   Smokeless tobacco: Never  Vaping Use   Vaping status: Never Used  Substance and Sexual Activity   Alcohol use: No   Drug use: No   Sexual activity: Yes    Birth control/protection: None

## 2022-12-25 NOTE — Therapy (Signed)
OUTPATIENT PHYSICAL THERAPY EVALUATION   Patient Name: Jessica Pacheco MRN: [id number] DOB:1976-01-23, 47 y.o., female Today's Date: 12/26/2022  END OF SESSION:  PT End of Session - 12/26/22 1309     Visit Number 1    Number of Visits 20    Authorization Type [employer] $20 copay    Progress Note Due on Visit 10    PT Start Time 1342    PT Stop Time 1430    PT Time Calculation (min) 48 min    Activity Tolerance Patient tolerated treatment well    Behavior During Therapy WFL for tasks assessed/performed             Past Medical History:  Diagnosis Date   Anxiety    Chronic right lower quadrant pain    DDD (degenerative disc disease), cervical    c6-7   Depression    Dyspnea    On exertion at times   GERD (gastroesophageal reflux disease)    WATCHES DIET   History of hypoglycemia    History of kidney stones    passed spontaneously   Hypertension    Hypothyroidism    OA (osteoarthritis)    knees , hips, feet   PONV (postoperative nausea and vomiting)    Rheumatoid arthritis(714.0) dx  2001  multiple sites   rheumotologist-  dr Molly Maduro gay w/ [facility]--- currently treated w/ oral arava and iv actemra   Shingles    SUI (stress urinary incontinence), female    Past Surgical History:  Procedure Laterality Date   COLONOSCOPY WITH PROPOFOL N/A 10/27/2021   Procedure: COLONOSCOPY WITH PROPOFOL;  Surgeon: Dolores Frame, MD;  Location: [facility];  Service: Gastroenterology;  Laterality: N/A;   DILATION AND CURETTAGE OF UTERUS     KNEE ARTHROSCOPY Right 1999   KNEE ARTHROSCOPY WITH MEDIAL MENISECTOMY Right 02/07/2018   Procedure: KNEE ARTHROSCOPY WITH PARTIAL LATERAL MENISECTOMY;  Surgeon: Vickki Hearing, MD;  Location: [facility];  Service: Orthopedics;  Laterality: Right;   LAPAROSCOPY N/A 02/16/2017   Procedure: LAPAROSCOPY DIAGNOSTIC;  Surgeon: Richarda Overlie, MD;  Location: [facility];  Service: Gynecology;  Laterality: N/A;    TOTAL HIP ARTHROPLASTY Right 07/30/2020   Procedure: RIGHT TOTAL HIP ARTHROPLASTY ANTERIOR APPROACH;  Surgeon: Kathryne Hitch, MD;  Location: [facility];  Service: Orthopedics;  Laterality: Right;   WISDOM TOOTH EXTRACTION     Patient Active Problem List   Diagnosis Date Noted   Status post total replacement of right hip 07/30/2020   Hypertensive disorder 05/14/2019   Hypothyroidism 05/14/2019   Migraine 05/14/2019   Ovarian endometriosis 05/14/2019   S/P right knee arthroscopy 02/07/18 02/07/2018   Meniscus, lateral, derangement, right    Synovitis of right knee    Arthritis of right knee    Osteoarthritis, multiple sites 01/14/2018   Rheumatoid arthritis of multiple sites with negative rheumatoid factor (HCC) 05/08/2016   Primary osteoarthritis of both knees 08/30/2015   Primary osteoarthritis of right hip 08/30/2015   Primary osteoarthritis of left foot 06/30/2014   Change in stool 07/27/2011   Contraception 01/02/2011   Rheumatoid arthritis (HCC) 02/21/1999    PCP: Carylon Perches.  MD  REFERRING PROVIDER: Juanda Chance, NP  REFERRING DIAG: M54.42,M54.41,G89.29 (ICD-10-CM) - Chronic bilateral low back pain with bilateral sciatica M54.16 (ICD-10-CM) - Lumbar radiculopathy M43.06 (ICD-10-CM) - Pars defect of lumbar spine M43.16 (ICD-10-CM) - Anterolisthesis of lumbar spine M79.18 (ICD-10-CM) - Myofascial pain syndrome  Rationale for Evaluation and Treatment: Rehabilitation  THERAPY DIAG:  Other low back pain  Difficulty in walking, not elsewhere classified  ONSET DATE: Acute on chronic (for years), worse since 8 or 10/2022.    SUBJECTIVE:                                                                                                                                                                                           SUBJECTIVE STATEMENT: Pt indicated having the majority of back pain on Rt side but also has noted some on Lt side.  Reported Rt sided lumbar  symptoms "off and on for years."  Pt indicated pain had taken them out of work in past."  Pt indicated doing physical therapy in past (end of last year) mainly for hip but also some for back as well.  Pt indicated some improvement in symptoms but not completely away.    Reported tightening returned after exercises.   Pt denied any numbness/tingling.    May have referral to Dr. Christell Constant in future. Visits to chiro seem to show improvement for 24-48 hours.     PERTINENT HISTORY:  "chiropractic treatments with Dr. Dr. Charissa Bash with [facility], reported significant relief of pain with dry needling. " History of right L3-L4 interlaminar epidural steroid injection in our office on 01/17/2022, she reports significant relief of pain for 3 months, greater than 50% relief of pain with this injection. We repeated this injection on 09/21/2022,   Rt hip replacement June 2022 Med hx:  DDD, depression, GERD, HTN, OA in LE, RA  PAIN:  NPRS scale: current:  5 /10 at worst:  8.5  /10 Pain location: back Rt > Lt  Pain description: ache/tightness with some increase sharp at times.  Aggravating factors: walking, housework with sweeping, mopping etc. , stairs.  Relieving factors: chiro visits,exercises at times, muscle relaxer at time.   PRECAUTIONS: None  WEIGHT BEARING RESTRICTIONS: No  FALLS:  Has patient fallen in last 6 months? No  LIVING ENVIRONMENT: Lives in: House/apartment Stairs: has stairs but bedroom on 1st floor.  2 stairs to enter.  OCCUPATION: xray tech history but on disability  PLOF: Independent ,  time at farm with interaction, walking when tolerated.   [employer] for pool activity.   PATIENT GOALS: Reduce pain/avoid surgery  Next MD Visit:  Nov 7th for injection   OBJECTIVE:   DIAGNOSTIC FINDINGS:  12/26/2022 Lumbar MRI imaging from 2023 exhibits grade 1 anterolisthesis at L3-L4 due to bilateral L3 pars defects.There is also mild right lateral recess and bilateral foraminal  stenosis at this level. No high grade spinal canal stenosis.   PATIENT SURVEYS:  12/26/2022 FOTO eval:  40  predicted:  57  SCREENING FOR RED FLAGS: 12/26/2022 Bowel or bladder incontinence: No Cauda equina syndrome: No  COGNITION: 12/26/2022 Overall cognitive status: WFL normal      SENSATION: 12/26/2022 WFL  MUSCLE LENGTH: 12/26/2022 No specific testing  POSTURE:  12/26/2022 No lateral shift noted in standing posture.   PALPATION: 12/26/2022 Specific pain with trigger points in Rt QL > Lt QL but present bilaterally.  Paraspinals guarding noted as well.   LUMBAR ROM:   AROM 12/26/2022  Flexion To mid shin with pailful arc noted with pain on Rt side  Extension 25% with end range pain  Rt lumbar  Repeated x 5 with improvement to 75% with pain still noted   Right lateral flexion   Left lateral flexion   Right rotation   Left rotation    (Blank rows = not tested)  LOWER EXTREMITY ROM:      Right 12/26/2022 Left 12/26/2022  Hip flexion    Hip extension    Hip abduction    Hip adduction    Hip internal rotation    Hip external rotation    Knee flexion    Knee extension    Ankle dorsiflexion    Ankle plantarflexion    Ankle inversion    Ankle eversion     (Blank rows = not tested)  LOWER EXTREMITY MMT:    MMT Right 12/26/2022 Left 12/26/2022  Hip flexion 5/5 5/5  Hip extension    Hip abduction    Hip adduction    Hip internal rotation    Hip external rotation    Knee flexion 5/5 5/5  Knee extension 5/5 5/5  Ankle dorsiflexion 5/5 5/5  Ankle plantarflexion    Ankle inversion    Ankle eversion     (Blank rows = not tested)  LUMBAR SPECIAL TESTS:  12/26/2022 (-) slump for radicular symptoms but Rt was painful for back.   FUNCTIONAL TESTS:  12/26/2022 18 inch chair transfer s UE assist with back pain noted with mid range hitch in movement due to pain.  GAIT: 12/26/2022 Independent ambulation in clinic.                                                                                                                                                                                                                    TODAY'S TREATMENT:  DATE: 12/26/2022  Therex:    HEP instruction/performance c cues for techniques, handout provided.  Trial set performed of each for comprehension and symptom assessment.  See below for exercise list  Moist heat 5 mins during education post needling.   Manual Compression Rt QL, skilled palpation with needling.   Trigger Point Dry-Needling  Treatment instructions: Expect mild to moderate muscle soreness. S/S of pneumothorax if dry needled over a lung field, and to seek immediate medical attention should they occur. Patient verbalized understanding of these instructions and education.  Patient Consent Given: Yes Education handout provided: Yes Muscles treated: Rt QL Treatment response/outcome: local twitch response noted.  Improved lumbar range and symptoms post needling.    PATIENT EDUCATION:  12/26/2022 Education details: HEP, POC Person educated: Patient Education method: Programmer, multimedia, Demonstration, Verbal cues, and Handouts Education comprehension: verbalized understanding, returned demonstration, and verbal cues required  HOME EXERCISE PROGRAM: Access Code: [email or web address] URL: [email or web address] Date: 12/26/2022 Prepared by: Chyrel Masson  Exercises - Supine Lower Trunk Rotation  - 2-3 x daily - 7 x weekly - 1 sets - 3-5 reps - 15 hold - Supine Single Knee to Chest Stretch  - 2-3 x daily - 7 x weekly - 1 sets - 3-5 reps - 15 hold - Hooklying Isometric Hip Flexion  - 1-2 x daily - 7 x weekly - 1 sets - 10 reps - 5-10 hold - Seated Multifidi Isometric  - 1-2 x daily - 7 x weekly - 1 sets - 10 reps - 5-10 hold  ASSESSMENT:  CLINICAL IMPRESSION: Patient is a 47 y.o. who comes to clinic  with complaints of back pain with mobility, strength and movement coordination deficits that impair their ability to perform usual daily and recreational functional activities without increase difficulty/symptoms at this time.  Patient to benefit from skilled PT services to address impairments and limitations to improve to previous level of function without restriction secondary to condition.   OBJECTIVE IMPAIRMENTS: Abnormal gait, decreased activity tolerance, decreased coordination, decreased endurance, decreased mobility, difficulty walking, decreased ROM, hypomobility, increased fascial restrictions, impaired perceived functional ability, increased muscle spasms, impaired flexibility, improper body mechanics, and pain.   ACTIVITY LIMITATIONS: carrying, lifting, bending, sitting, standing, squatting, sleeping, stairs, transfers, bed mobility, bathing, dressing, and locomotion level  PARTICIPATION LIMITATIONS: meal prep, cleaning, laundry, interpersonal relationship, shopping, community activity, and yard work  PERSONAL FACTORS: Time since onset of injury/illness/exacerbation and Med hx:  DDD, depression, GERD, HTN, OA in LE, RA  are also affecting patient's functional outcome.   REHAB POTENTIAL: Good  CLINICAL DECISION MAKING: Stable/uncomplicated  EVALUATION COMPLEXITY: Low   GOALS: Goals reviewed with patient? Yes  SHORT TERM GOALS: (target date for Short term goals are 3 weeks 01/16/2023)  1. Patient will demonstrate independent use of home exercise program to maintain progress from in clinic treatments.  Goal status: New  LONG TERM GOALS: (target dates for all long term goals are 10 weeks  03/06/2023 )   1. Patient will demonstrate/report pain at worst less than or equal to 2/10 to facilitate minimal limitation in daily activity secondary to pain symptoms.  Goal status: New   2. Patient will demonstrate independent use of home exercise program to facilitate ability to  maintain/progress functional gains from skilled physical therapy services.  Goal status: New   3. Patient will demonstrate FOTO outcome > or = 57 % to indicate reduced disability due to condition.  Goal status: New   4. Patient will demonstrate lumbar extension 100 % WFL s  symptoms to facilitate upright standing, walking posture at PLOF s limitation.  Goal status: New   5.  Patient will demonstrate/report ability to ascend/descend stairs outside of house s restriction due to symptoms.   Goal status: New   6.  Patient will demonstrate/report ability to perform standing, walking > 30 mins for normal farm related activity.  Goal status: New    PLAN:  PT FREQUENCY: 1-2x/week  PT DURATION: 10 weeks  PLANNED INTERVENTIONS: Can include 04540- PT Re-evaluation, 97110-Therapeutic exercises, 97530- Therapeutic activity, [id number]- Neuromuscular re-education, 97535- Self Care, 97140- Manual therapy, [id number]- Gait training, [id number]- Orthotic Fit/training, [phone]- Canalith repositioning, [id number]- Aquatic Therapy, 97014- Electrical stimulation (unattended), [id number]- Electrical stimulation (manual), [id number]- Vasopneumatic device, [id number]- Ultrasound, [id number]- Traction (mechanical), [id number]- Ionotophoresis 4mg /ml Dexamethasone, Patient/Family education, Balance training, Stair training, Taping, Dry Needling, Joint mobilization, Joint manipulation, Spinal manipulation, Spinal mobilization, Scar mobilization, Vestibular training, Visual/preceptual remediation/compensation, DME instructions, Cryotherapy, and Moist heat.  All performed as medically necessary.  All included unless contraindicated  PLAN FOR NEXT SESSION: Review HEP knowledge/results.   Dry needling as desired.    Chyrel Masson, PT, DPT, OCS, ATC 12/26/22  2:45 PM

## 2022-12-26 ENCOUNTER — Other Ambulatory Visit: Payer: Self-pay

## 2022-12-26 ENCOUNTER — Ambulatory Visit: Payer: Medicare HMO | Admitting: Rehabilitative and Restorative Service Providers"

## 2022-12-26 ENCOUNTER — Encounter: Payer: Self-pay | Admitting: Rehabilitative and Restorative Service Providers"

## 2022-12-26 DIAGNOSIS — M5459 Other low back pain: Secondary | ICD-10-CM | POA: Diagnosis not present

## 2022-12-26 DIAGNOSIS — R262 Difficulty in walking, not elsewhere classified: Secondary | ICD-10-CM | POA: Diagnosis not present

## 2022-12-26 DIAGNOSIS — B070 Plantar wart: Secondary | ICD-10-CM | POA: Diagnosis not present

## 2022-12-28 ENCOUNTER — Other Ambulatory Visit: Payer: Self-pay

## 2022-12-28 ENCOUNTER — Ambulatory Visit: Payer: Medicare HMO | Admitting: Physical Medicine and Rehabilitation

## 2022-12-28 DIAGNOSIS — M5416 Radiculopathy, lumbar region: Secondary | ICD-10-CM | POA: Diagnosis not present

## 2022-12-28 MED ORDER — METHYLPREDNISOLONE ACETATE 40 MG/ML IJ SUSP
40.0000 mg | Freq: Once | INTRAMUSCULAR | Status: AC
  Administered 2022-12-28: 40 mg

## 2022-12-28 NOTE — Procedures (Signed)
Lumbar Epidural Steroid Injection - Interlaminar Approach with Fluoroscopic Guidance  Patient: Jessica Pacheco      Date of Birth: 04/05/1975 MRN: [id number] PCP: Carylon Perches, MD      Visit Date: 12/28/2022   Universal Protocol:     Consent Given By: the patient  Position: PRONE  Additional Comments: Vital signs were monitored before and after the procedure. Patient was prepped and draped in the usual sterile fashion. The correct patient, procedure, and site was verified.   Injection Procedure Details:   Procedure diagnoses: Lumbar radiculopathy [M54.16]   Meds Administered:  Meds ordered this encounter  Medications   methylPREDNISolone acetate (DEPO-MEDROL) injection 40 mg     Laterality: Right  Location/Site:  L3-4  Needle: 3.5 in., 20 ga. Tuohy  Needle Placement: Paramedian epidural  Findings:   -Comments: Excellent flow of contrast into the epidural space.  Procedure Details: Using a paramedian approach from the side mentioned above, the region overlying the inferior lamina was localized under fluoroscopic visualization and the soft tissues overlying this structure were infiltrated with 4 ml. of 1% Lidocaine without Epinephrine. The Tuohy needle was inserted into the epidural space using a paramedian approach.   The epidural space was localized using loss of resistance along with counter oblique bi-planar fluoroscopic views.  After negative aspirate for air, blood, and CSF, a 2 ml. volume of Isovue-250 was injected into the epidural space and the flow of contrast was observed. Radiographs were obtained for documentation purposes.    The injectate was administered into the level noted above.   Additional Comments:  No complications occurred Dressing: 2 x 2 sterile gauze and Band-Aid    Post-procedure details: Patient was observed during the procedure. Post-procedure instructions were reviewed.  Patient left the clinic in stable condition.

## 2022-12-28 NOTE — Patient Instructions (Signed)

## 2022-12-28 NOTE — Progress Notes (Signed)
Jessica Pacheco - 47 y.o. female MRN [id number]  Date of birth: 04-30-1975  Office Visit Note: Visit Date: 12/28/2022 PCP: Carylon Perches, MD Referred by: Carylon Perches, MD  Subjective: Chief Complaint  Patient presents with   Lower Back - Pain   HPI:  Jessica Pacheco is a 47 y.o. female who comes in today for planned repeat Right L3-4  Lumbar Interlaminar epidural steroid injection with fluoroscopic guidance.  The patient has failed conservative care including home exercise, medications, time and activity modification.  This injection will be diagnostic and hopefully therapeutic.  Please see requesting physician notes for further details and justification. Patient received more than 50% pain relief from prior injection.   Referring: Ellin Goodie, FNP   ROS Otherwise per HPI.  Assessment & Plan: Visit Diagnoses:    ICD-10-CM   1. Lumbar radiculopathy  M54.16 methylPREDNISolone acetate (DEPO-MEDROL) injection 40 mg    XR C-ARM NO REPORT    Epidural Steroid injection      Plan: No additional findings.   Meds & Orders:  Meds ordered this encounter  Medications   methylPREDNISolone acetate (DEPO-MEDROL) injection 40 mg    Orders Placed This Encounter  Procedures   XR C-ARM NO REPORT   Epidural Steroid injection    Follow-up: Return for visit to requesting provider as needed.   Procedures: No procedures performed  Lumbar Epidural Steroid Injection - Interlaminar Approach with Fluoroscopic Guidance  Patient: Jessica Pacheco      Date of Birth: [birth year] MRN: [id number] PCP: Carylon Perches, MD      Visit Date: 12/28/2022   Universal Protocol:     Consent Given By: the patient  Position: PRONE  Additional Comments: Vital signs were monitored before and after the procedure. Patient was prepped and draped in the usual sterile fashion. The correct patient, procedure, and site was verified.   Injection Procedure Details:   Procedure diagnoses: Lumbar radiculopathy  [M54.16]   Meds Administered:  Meds ordered this encounter  Medications   methylPREDNISolone acetate (DEPO-MEDROL) injection 40 mg     Laterality: Right  Location/Site:  L3-4  Needle: 3.5 in., 20 ga. Tuohy  Needle Placement: Paramedian epidural  Findings:   -Comments: Excellent flow of contrast into the epidural space.  Procedure Details: Using a paramedian approach from the side mentioned above, the region overlying the inferior lamina was localized under fluoroscopic visualization and the soft tissues overlying this structure were infiltrated with 4 ml. of 1% Lidocaine without Epinephrine. The Tuohy needle was inserted into the epidural space using a paramedian approach.   The epidural space was localized using loss of resistance along with counter oblique bi-planar fluoroscopic views.  After negative aspirate for air, blood, and CSF, a 2 ml. volume of Isovue-250 was injected into the epidural space and the flow of contrast was observed. Radiographs were obtained for documentation purposes.    The injectate was administered into the level noted above.   Additional Comments:  No complications occurred Dressing: 2 x 2 sterile gauze and Band-Aid    Post-procedure details: Patient was observed during the procedure. Post-procedure instructions were reviewed.  Patient left the clinic in stable condition.   Clinical History: MRI LUMBAR SPINE WITHOUT CONTRAST   TECHNIQUE: Multiplanar, multisequence MR imaging of the lumbar spine was performed. No intravenous contrast was administered.   COMPARISON:  Lumbar spine x-rays dated December 12, 2021.   FINDINGS: Segmentation:  Standard.   Alignment: 6 mm anterolisthesis at L3-L4 due to bilateral L3 pars  defects.   Vertebrae: No fracture, evidence of discitis, or bone lesion. Asymmetric right-sided degenerative endplate marrow edema at L3-L4.   Conus medullaris and cauda equina: Conus extends to the T12-L1 level. Conus and  cauda equina appear normal.   Paraspinal and other soft tissues: Negative.   Disc levels:   T11-T12 to L2-L3: Negative.   L3-L4: Disc uncovering and mild disc bulging eccentric to the right. Mild right lateral recess and bilateral neuroforaminal stenosis. No spinal canal stenosis.   L4-L5:  Negative.   L5-S1: Negative disc. Mild bilateral facet arthropathy. No stenosis.   IMPRESSION: 1. Grade 1 anterolisthesis at L3-L4 due to bilateral L3 pars defects. Mild right lateral recess and bilateral neuroforaminal stenosis at this level.     Electronically Signed   By: Obie Dredge M.D.   On: 12/23/2021 14:21     Objective:  VS:  HT:    WT:   BMI:     BP:   HR: bpm  TEMP: ( )  RESP:  Physical Exam Vitals and nursing note reviewed.  Constitutional:      General: She is not in acute distress.    Appearance: Normal appearance. She is not ill-appearing.  HENT:     Head: Normocephalic and atraumatic.     Right Ear: External ear normal.     Left Ear: External ear normal.  Eyes:     Extraocular Movements: Extraocular movements intact.  Cardiovascular:     Rate and Rhythm: Normal rate.     Pulses: Normal pulses.  Pulmonary:     Effort: Pulmonary effort is normal. No respiratory distress.  Abdominal:     General: There is no distension.     Palpations: Abdomen is soft.  Musculoskeletal:        General: Tenderness present.     Cervical back: Neck supple.     Right lower leg: No edema.     Left lower leg: No edema.     Comments: Patient has good distal strength with no pain over the greater trochanters.  No clonus or focal weakness.  Skin:    Findings: No erythema, lesion or rash.  Neurological:     General: No focal deficit present.     Mental Status: She is alert and oriented to person, place, and time.     Sensory: No sensory deficit.     Motor: No weakness or abnormal muscle tone.     Coordination: Coordination normal.  Psychiatric:        Mood and Affect:  Mood normal.        Behavior: Behavior normal.      Imaging: No results found.

## 2022-12-28 NOTE — Progress Notes (Signed)
Functional Pain Scale - descriptive words and definitions  Distracting (5)    Aware of pain/able to complete some ADL's but limited by pain/sleep is affected and active distractions are only slightly useful. Moderate range order  Average Pain 5  118/80 +Driver, -BT, -Dye Allergies.

## 2023-01-02 DIAGNOSIS — S338XXA Sprain of other parts of lumbar spine and pelvis, initial encounter: Secondary | ICD-10-CM | POA: Diagnosis not present

## 2023-01-02 DIAGNOSIS — S233XXA Sprain of ligaments of thoracic spine, initial encounter: Secondary | ICD-10-CM | POA: Diagnosis not present

## 2023-01-02 DIAGNOSIS — M9903 Segmental and somatic dysfunction of lumbar region: Secondary | ICD-10-CM | POA: Diagnosis not present

## 2023-01-02 DIAGNOSIS — M9902 Segmental and somatic dysfunction of thoracic region: Secondary | ICD-10-CM | POA: Diagnosis not present

## 2023-01-05 ENCOUNTER — Ambulatory Visit: Payer: Medicare HMO | Admitting: Rehabilitative and Restorative Service Providers"

## 2023-01-05 ENCOUNTER — Encounter: Payer: Self-pay | Admitting: Rehabilitative and Restorative Service Providers"

## 2023-01-05 DIAGNOSIS — R262 Difficulty in walking, not elsewhere classified: Secondary | ICD-10-CM

## 2023-01-05 DIAGNOSIS — M5459 Other low back pain: Secondary | ICD-10-CM | POA: Diagnosis not present

## 2023-01-05 NOTE — Therapy (Signed)
OUTPATIENT PHYSICAL THERAPY TREATMENT   Patient Name: Jessica Pacheco MRN: [id number] DOB:18-Jul-1975, 47 y.o., female Today's Date: 01/05/2023  END OF SESSION:  PT End of Session - 01/05/23 0832     Visit Number 2    Number of Visits 20    Date for PT Re-Evaluation 03/06/23    Authorization Type [employer] $20 copay    Progress Note Due on Visit 10    PT Start Time 0838    PT Stop Time 0917    PT Time Calculation (min) 39 min    Activity Tolerance Patient tolerated treatment well    Behavior During Therapy [facility] for tasks assessed/performed              Past Medical History:  Diagnosis Date   Anxiety    Chronic right lower quadrant pain    DDD (degenerative disc disease), cervical    c6-7   Depression    Dyspnea    On exertion at times   GERD (gastroesophageal reflux disease)    WATCHES DIET   History of hypoglycemia    History of kidney stones    passed spontaneously   Hypertension    Hypothyroidism    OA (osteoarthritis)    knees , hips, feet   PONV (postoperative nausea and vomiting)    Rheumatoid arthritis(714.0) dx  2001  multiple sites   rheumotologist-  dr Molly Maduro gay w/ [facility]--- currently treated w/ oral arava and iv actemra   Shingles    SUI (stress urinary incontinence), female    Past Surgical History:  Procedure Laterality Date   COLONOSCOPY WITH PROPOFOL N/A 10/27/2021   Procedure: COLONOSCOPY WITH PROPOFOL;  Surgeon: Dolores Frame, MD;  Location: [facility];  Service: Gastroenterology;  Laterality: N/A;   DILATION AND CURETTAGE OF UTERUS     KNEE ARTHROSCOPY Right 1999   KNEE ARTHROSCOPY WITH MEDIAL MENISECTOMY Right 02/07/2018   Procedure: KNEE ARTHROSCOPY WITH PARTIAL LATERAL MENISECTOMY;  Surgeon: Vickki Hearing, MD;  Location: [facility];  Service: Orthopedics;  Laterality: Right;   LAPAROSCOPY N/A 02/16/2017   Procedure: LAPAROSCOPY DIAGNOSTIC;  Surgeon: Richarda Overlie, MD;  Location: [facility];   Service: Gynecology;  Laterality: N/A;   TOTAL HIP ARTHROPLASTY Right 07/30/2020   Procedure: RIGHT TOTAL HIP ARTHROPLASTY ANTERIOR APPROACH;  Surgeon: Kathryne Hitch, MD;  Location: [facility];  Service: Orthopedics;  Laterality: Right;   WISDOM TOOTH EXTRACTION     Patient Active Problem List   Diagnosis Date Noted   Status post total replacement of right hip 07/30/2020   Hypertensive disorder 05/14/2019   Hypothyroidism 05/14/2019   Migraine 05/14/2019   Ovarian endometriosis 05/14/2019   S/P right knee arthroscopy 02/07/18 02/07/2018   Meniscus, lateral, derangement, right    Synovitis of right knee    Arthritis of right knee    Osteoarthritis, multiple sites 01/14/2018   Rheumatoid arthritis of multiple sites with negative rheumatoid factor (HCC) 05/08/2016   Primary osteoarthritis of both knees 08/30/2015   Primary osteoarthritis of right hip 08/30/2015   Primary osteoarthritis of left foot 06/30/2014   Change in stool 07/27/2011   Contraception 01/02/2011   Rheumatoid arthritis (HCC) 02/21/1999    PCP: Carylon Perches.  MD  REFERRING PROVIDER: Juanda Chance, NP  REFERRING DIAG: M54.42,M54.41,G89.29 (ICD-10-CM) - Chronic bilateral low back pain with bilateral sciatica M54.16 (ICD-10-CM) - Lumbar radiculopathy M43.06 (ICD-10-CM) - Pars defect of lumbar spine M43.16 (ICD-10-CM) - Anterolisthesis of lumbar spine M79.18 (ICD-10-CM) - Myofascial pain  syndrome  Rationale for Evaluation and Treatment: Rehabilitation  THERAPY DIAG:  Other low back pain  Difficulty in walking, not elsewhere classified  ONSET DATE: Acute on chronic (for years), worse since 8 or 10/2022.    SUBJECTIVE:                                                                                                                                                                                           SUBJECTIVE STATEMENT: Pt indicated still having complaints but did feel better and stay improved since  last visit.   Had injection again 8 days ago with improvement from it as well.   PERTINENT HISTORY:  "chiropractic treatments with Dr. Dr. Charissa Bash with [facility], reported significant relief of pain with dry needling. " History of right L3-L4 interlaminar epidural steroid injection in our office on 01/17/2022, she reports significant relief of pain for 3 months, greater than 50% relief of pain with this injection. We repeated this injection on 09/21/2022,   Rt hip replacement June 2022 Med hx:  DDD, depression, GERD, HTN, OA in LE, RA  PAIN:  NPRS scale: at worst in last few days:  5/10 Pain location: back Rt > Lt  Pain description: ache/tightness with some increase sharp at times.  Aggravating factors: walking, housework with sweeping, mopping etc. , stairs.  Relieving factors: chiro visits,exercises at times, muscle relaxer at time.   PRECAUTIONS: None  WEIGHT BEARING RESTRICTIONS: No  FALLS:  Has patient fallen in last 6 months? No  LIVING ENVIRONMENT: Lives in: House/apartment Stairs: has stairs but bedroom on 1st floor.  2 stairs to enter.  OCCUPATION: xray tech history but on disability  PLOF: Independent ,  time at farm with interaction, walking when tolerated.   [employer] for pool activity.   PATIENT GOALS: Reduce pain/avoid surgery   OBJECTIVE:   DIAGNOSTIC FINDINGS:  12/26/2022 Lumbar MRI imaging from 2023 exhibits grade 1 anterolisthesis at L3-L4 due to bilateral L3 pars defects.There is also mild right lateral recess and bilateral foraminal stenosis at this level. No high grade spinal canal stenosis.   PATIENT SURVEYS:  12/26/2022 FOTO eval:  40  predicted:  57  SCREENING FOR RED FLAGS: 12/26/2022 Bowel or bladder incontinence: No Cauda equina syndrome: No  COGNITION: 12/26/2022 Overall cognitive status: WFL normal      SENSATION: 12/26/2022 WFL  MUSCLE LENGTH: 12/26/2022 No specific testing  POSTURE:  12/26/2022 No lateral shift noted in  standing posture.   PALPATION: 12/26/2022 Specific pain with trigger points in Rt QL > Lt QL but present bilaterally.  Paraspinals guarding noted as well.   LUMBAR ROM:  AROM 12/26/2022 01/05/2023  Flexion To mid shin with pailful arc noted with pain on Rt side To toes with discomfort on Rt lumbar  Extension 25% with end range pain  Rt lumbar  Repeated x 5 with improvement to 75% with pain still noted  100 % WFL with some discomfort  Repeated x 5 : same discomfort  Right lateral flexion    Left lateral flexion    Right rotation    Left rotation     (Blank rows = not tested)  LOWER EXTREMITY ROM:      Right 12/26/2022 Left 12/26/2022  Hip flexion    Hip extension    Hip abduction    Hip adduction    Hip internal rotation    Hip external rotation    Knee flexion    Knee extension    Ankle dorsiflexion    Ankle plantarflexion    Ankle inversion    Ankle eversion     (Blank rows = not tested)  LOWER EXTREMITY MMT:    MMT Right 12/26/2022 Left 12/26/2022  Hip flexion 5/5 5/5  Hip extension    Hip abduction    Hip adduction    Hip internal rotation    Hip external rotation    Knee flexion 5/5 5/5  Knee extension 5/5 5/5  Ankle dorsiflexion 5/5 5/5  Ankle plantarflexion    Ankle inversion    Ankle eversion     (Blank rows = not tested)  LUMBAR SPECIAL TESTS:  12/26/2022 (-) slump for radicular symptoms but Rt was painful for back.   FUNCTIONAL TESTS:  12/26/2022 18 inch chair transfer s UE assist with back pain noted with mid range hitch in movement due to pain.  GAIT: 12/26/2022 Independent ambulation in clinic.                                                                                                                                                                                                                   TODAY'S TREATMENT:  DATE: 01/05/2023   Therex: Nustep Lvl 6 UE/LE 10 mins  Tband rows blue band 2 x 15 Tband gh ext blue band 2 x 15  Seated slr 2 x 10 bilaterally Seated pball press down 10 sec hold x 10   Manual Compression Rt QL, skilled palpation with needling.   Trigger Point Dry-Needling  Treatment instructions: Expect mild to moderate muscle soreness. S/S of pneumothorax if dry needled over a lung field, and to seek immediate medical attention should they occur. Patient verbalized understanding of these instructions and education.  Patient Consent Given: Yes Education handout provided: Yes Muscles treated: Rt QL Treatment response/outcome: local twitch response noted.  Improved lumbar range and symptoms post needling.   TODAY'S TREATMENT:                                                                                                         DATE: 12/26/2022  Therex:    HEP instruction/performance c cues for techniques, handout provided.  Trial set performed of each for comprehension and symptom assessment.  See below for exercise list  Moist heat 5 mins during education post needling.   Manual Compression Rt QL, skilled palpation with needling.   Trigger Point Dry-Needling  Treatment instructions: Expect mild to moderate muscle soreness. S/S of pneumothorax if dry needled over a lung field, and to seek immediate medical attention should they occur. Patient verbalized understanding of these instructions and education.  Patient Consent Given: Yes Education handout provided: Yes Muscles treated: Rt QL Treatment response/outcome: local twitch response noted.  Improved lumbar range and symptoms post needling.    PATIENT EDUCATION:  01/05/2023 Education details: HEP update Person educated: Patient Education method: Programmer, multimedia, Demonstration, Verbal cues, and Handouts Education comprehension: verbalized understanding, returned demonstration, and verbal cues required  HOME EXERCISE PROGRAM: Access Code:  [email or web address] URL: [email or web address] Date: 01/05/2023 Prepared by: Chyrel Masson  Exercises - Supine Lower Trunk Rotation  - 2-3 x daily - 7 x weekly - 1 sets - 3-5 reps - 15 hold - Supine Single Knee to Chest Stretch  - 2-3 x daily - 7 x weekly - 1 sets - 3-5 reps - 15 hold - Hooklying Isometric Hip Flexion  - 1-2 x daily - 7 x weekly - 1 sets - 10 reps - 5-10 hold - Seated Multifidi Isometric  - 1-2 x daily - 7 x weekly - 1 sets - 10 reps - 5-10 hold - Standing Shoulder Row with Anchored Resistance  - 1-2 x daily - 7 x weekly - 1-2 sets - 10-15 reps - Shoulder Extension with Resistance  - 1-2 x daily - 7 x weekly - 1-2 sets - 10-15 reps - Seated Straight Leg Heel Taps  - 1-2 x daily - 7 x weekly - 3 sets - 10 reps  ASSESSMENT:  CLINICAL IMPRESSION: Improvement in symptoms and in active range of lumbar was noted today in comparision to eval.  Continued use of dry needling performed today to help continue to reduce tightness in Rt side of lumbar.     OBJECTIVE IMPAIRMENTS:  Abnormal gait, decreased activity tolerance, decreased coordination, decreased endurance, decreased mobility, difficulty walking, decreased ROM, hypomobility, increased fascial restrictions, impaired perceived functional ability, increased muscle spasms, impaired flexibility, improper body mechanics, and pain.   ACTIVITY LIMITATIONS: carrying, lifting, bending, sitting, standing, squatting, sleeping, stairs, transfers, bed mobility, bathing, dressing, and locomotion level  PARTICIPATION LIMITATIONS: meal prep, cleaning, laundry, interpersonal relationship, shopping, community activity, and yard work  PERSONAL FACTORS: Time since onset of injury/illness/exacerbation and Med hx:  DDD, depression, GERD, HTN, OA in LE, RA  are also affecting patient's functional outcome.   REHAB POTENTIAL: Good  CLINICAL DECISION MAKING: Stable/uncomplicated  EVALUATION COMPLEXITY: Low   GOALS: Goals reviewed with  patient? Yes  SHORT TERM GOALS: (target date for Short term goals are 3 weeks 01/16/2023)  1. Patient will demonstrate independent use of home exercise program to maintain progress from in clinic treatments.  Goal status: Met   LONG TERM GOALS: (target dates for all long term goals are 10 weeks  03/06/2023 )   1. Patient will demonstrate/report pain at worst less than or equal to 2/10 to facilitate minimal limitation in daily activity secondary to pain symptoms.  Goal status: New   2. Patient will demonstrate independent use of home exercise program to facilitate ability to maintain/progress functional gains from skilled physical therapy services.  Goal status: New   3. Patient will demonstrate FOTO outcome > or = 57 % to indicate reduced disability due to condition.  Goal status: New   4. Patient will demonstrate lumbar extension 100 % WFL s symptoms to facilitate upright standing, walking posture at PLOF s limitation.  Goal status: New   5.  Patient will demonstrate/report ability to ascend/descend stairs outside of house s restriction due to symptoms.   Goal status: New   6.  Patient will demonstrate/report ability to perform standing, walking > 30 mins for normal farm related activity.  Goal status: New    PLAN:  PT FREQUENCY: 1-2x/week  PT DURATION: 10 weeks  PLANNED INTERVENTIONS: Can include 29528- PT Re-evaluation, 97110-Therapeutic exercises, 97530- Therapeutic activity, [id number]- Neuromuscular re-education, 97535- Self Care, 97140- Manual therapy, [phone]- Gait training, [phone]- Orthotic Fit/training, [phone]- Canalith repositioning, [id number]- Aquatic Therapy, 97014- Electrical stimulation (unattended), [id number]- Electrical stimulation (manual), [id number]- Vasopneumatic device, [id number]- Ultrasound, [id number]- Traction (mechanical), [id number]- Ionotophoresis 4mg /ml Dexamethasone, Patient/Family education, Balance training, Stair training, Taping, Dry Needling, Joint mobilization, Joint  manipulation, Spinal manipulation, Spinal mobilization, Scar mobilization, Vestibular training, Visual/preceptual remediation/compensation, DME instructions, Cryotherapy, and Moist heat.  All performed as medically necessary.  All included unless contraindicated  PLAN FOR NEXT SESSION: DN as desired, progressive strengthening.    Chyrel Masson, PT, DPT, OCS, ATC 01/05/23  9:16 AM

## 2023-01-08 DIAGNOSIS — I10 Essential (primary) hypertension: Secondary | ICD-10-CM | POA: Diagnosis not present

## 2023-01-08 DIAGNOSIS — Z23 Encounter for immunization: Secondary | ICD-10-CM | POA: Diagnosis not present

## 2023-01-08 DIAGNOSIS — R0602 Shortness of breath: Secondary | ICD-10-CM | POA: Diagnosis not present

## 2023-01-08 DIAGNOSIS — M069 Rheumatoid arthritis, unspecified: Secondary | ICD-10-CM | POA: Diagnosis not present

## 2023-01-08 DIAGNOSIS — B0223 Postherpetic polyneuropathy: Secondary | ICD-10-CM | POA: Diagnosis not present

## 2023-01-09 ENCOUNTER — Encounter: Payer: Self-pay | Admitting: Rehabilitative and Restorative Service Providers"

## 2023-01-09 ENCOUNTER — Ambulatory Visit: Payer: Medicare HMO | Admitting: Rehabilitative and Restorative Service Providers"

## 2023-01-09 DIAGNOSIS — M5459 Other low back pain: Secondary | ICD-10-CM | POA: Diagnosis not present

## 2023-01-09 DIAGNOSIS — R262 Difficulty in walking, not elsewhere classified: Secondary | ICD-10-CM

## 2023-01-09 DIAGNOSIS — Z79899 Other long term (current) drug therapy: Secondary | ICD-10-CM | POA: Diagnosis not present

## 2023-01-09 DIAGNOSIS — M0609 Rheumatoid arthritis without rheumatoid factor, multiple sites: Secondary | ICD-10-CM | POA: Diagnosis not present

## 2023-01-09 NOTE — Therapy (Signed)
OUTPATIENT PHYSICAL THERAPY TREATMENT   Patient Name: Jessica Pacheco MRN: [id number] DOB:1975-10-19, 47 y.o., female Today's Date: 01/09/2023  END OF SESSION:  PT End of Session - 01/09/23 1053     Visit Number 3    Number of Visits 20    Date for PT Re-Evaluation 03/06/23    Authorization Type [employer] $20 copay    Progress Note Due on Visit 10    PT Start Time 1054    PT Stop Time 1133    PT Time Calculation (min) 39 min    Activity Tolerance Patient tolerated treatment well    Behavior During Therapy WFL for tasks assessed/performed               Past Medical History:  Diagnosis Date   Anxiety    Chronic right lower quadrant pain    DDD (degenerative disc disease), cervical    c6-7   Depression    Dyspnea    On exertion at times   GERD (gastroesophageal reflux disease)    WATCHES DIET   History of hypoglycemia    History of kidney stones    passed spontaneously   Hypertension    Hypothyroidism    OA (osteoarthritis)    knees , hips, feet   PONV (postoperative nausea and vomiting)    Rheumatoid arthritis(714.0) dx  2001  multiple sites   rheumotologist-  dr Molly Maduro gay w/ [facility]--- currently treated w/ oral arava and iv actemra   Shingles    SUI (stress urinary incontinence), female    Past Surgical History:  Procedure Laterality Date   COLONOSCOPY WITH PROPOFOL N/A 10/27/2021   Procedure: COLONOSCOPY WITH PROPOFOL;  Surgeon: Dolores Frame, MD;  Location: [facility];  Service: Gastroenterology;  Laterality: N/A;   DILATION AND CURETTAGE OF UTERUS     KNEE ARTHROSCOPY Right 1999   KNEE ARTHROSCOPY WITH MEDIAL MENISECTOMY Right 02/07/2018   Procedure: KNEE ARTHROSCOPY WITH PARTIAL LATERAL MENISECTOMY;  Surgeon: Vickki Hearing, MD;  Location: [facility];  Service: Orthopedics;  Laterality: Right;   LAPAROSCOPY N/A 02/16/2017   Procedure: LAPAROSCOPY DIAGNOSTIC;  Surgeon: Richarda Overlie, MD;  Location: [facility];   Service: Gynecology;  Laterality: N/A;   TOTAL HIP ARTHROPLASTY Right 07/30/2020   Procedure: RIGHT TOTAL HIP ARTHROPLASTY ANTERIOR APPROACH;  Surgeon: Kathryne Hitch, MD;  Location: [facility];  Service: Orthopedics;  Laterality: Right;   WISDOM TOOTH EXTRACTION     Patient Active Problem List   Diagnosis Date Noted   Status post total replacement of right hip 07/30/2020   Hypertensive disorder 05/14/2019   Hypothyroidism 05/14/2019   Migraine 05/14/2019   Ovarian endometriosis 05/14/2019   S/P right knee arthroscopy 02/07/18 02/07/2018   Meniscus, lateral, derangement, right    Synovitis of right knee    Arthritis of right knee    Osteoarthritis, multiple sites 01/14/2018   Rheumatoid arthritis of multiple sites with negative rheumatoid factor (HCC) 05/08/2016   Primary osteoarthritis of both knees 08/30/2015   Primary osteoarthritis of right hip 08/30/2015   Primary osteoarthritis of left foot 06/30/2014   Change in stool 07/27/2011   Contraception 01/02/2011   Rheumatoid arthritis (HCC) 02/21/1999    PCP: Carylon Perches.  MD  REFERRING PROVIDER: Juanda Chance, NP  REFERRING DIAG: M54.42,M54.41,G89.29 (ICD-10-CM) - Chronic bilateral low back pain with bilateral sciatica M54.16 (ICD-10-CM) - Lumbar radiculopathy M43.06 (ICD-10-CM) - Pars defect of lumbar spine M43.16 (ICD-10-CM) - Anterolisthesis of lumbar spine M79.18 (ICD-10-CM) - Myofascial  pain syndrome  Rationale for Evaluation and Treatment: Rehabilitation  THERAPY DIAG:  Other low back pain  Difficulty in walking, not elsewhere classified  ONSET DATE: Acute on chronic (for years), worse since 8 or 10/2022.    SUBJECTIVE:                                                                                                                                                                                           SUBJECTIVE STATEMENT: Pt indicated 1.5 /10 upon arrival today.  Pt indicated at worst from weekend about  2-3/10 range.  Reported feeling amazed at symptom improvement.  Pt indicated not restricting activity due to back but due to other medical items (shortness of breath at times, etc).   PERTINENT HISTORY:  "chiropractic treatments with Dr. Dr. Charissa Bash with [facility], reported significant relief of pain with dry needling. " History of right L3-L4 interlaminar epidural steroid injection in our office on 01/17/2022, she reports significant relief of pain for 3 months, greater than 50% relief of pain with this injection. We repeated this injection on 09/21/2022,   Rt hip replacement June 2022 Med hx:  DDD, depression, GERD, HTN, OA in LE, RA  PAIN:  NPRS scale: arrival 1.5/10 Pain location: back Rt > Lt  Pain description: ache/tightness with some increase sharp at times.  Aggravating factors: walking, housework with sweeping, mopping etc. , stairs.  Relieving factors: chiro visits,exercises at times, muscle relaxer at time.   PRECAUTIONS: None  WEIGHT BEARING RESTRICTIONS: No  FALLS:  Has patient fallen in last 6 months? No  LIVING ENVIRONMENT: Lives in: House/apartment Stairs: has stairs but bedroom on 1st floor.  2 stairs to enter.  OCCUPATION: xray tech history but on disability  PLOF: Independent ,  time at farm with interaction, walking when tolerated.   [employer] for pool activity.   PATIENT GOALS: Reduce pain/avoid surgery   OBJECTIVE:   DIAGNOSTIC FINDINGS:  12/26/2022 Lumbar MRI imaging from 2023 exhibits grade 1 anterolisthesis at L3-L4 due to bilateral L3 pars defects.There is also mild right lateral recess and bilateral foraminal stenosis at this level. No high grade spinal canal stenosis.   PATIENT SURVEYS:  01/09/2023: FOTO update: 63  12/26/2022 FOTO eval:  40  predicted:  57  SCREENING FOR RED FLAGS: 12/26/2022 Bowel or bladder incontinence: No Cauda equina syndrome: No  COGNITION: 12/26/2022 Overall cognitive status: WFL  normal      SENSATION: 12/26/2022 WFL  MUSCLE LENGTH: 12/26/2022 No specific testing  POSTURE:  12/26/2022 No lateral shift noted in standing posture.   PALPATION: 12/26/2022 Specific pain with trigger points in Rt QL > Lt  QL but present bilaterally.  Paraspinals guarding noted as well.   LUMBAR ROM:   AROM 12/26/2022 01/05/2023  Flexion To mid shin with pailful arc noted with pain on Rt side To toes with discomfort on Rt lumbar  Extension 25% with end range pain  Rt lumbar  Repeated x 5 with improvement to 75% with pain still noted  100 % WFL with some discomfort  Repeated x 5 : same discomfort  Right lateral flexion    Left lateral flexion    Right rotation    Left rotation     (Blank rows = not tested)  LOWER EXTREMITY ROM:      Right 12/26/2022 Left 12/26/2022  Hip flexion    Hip extension    Hip abduction    Hip adduction    Hip internal rotation    Hip external rotation    Knee flexion    Knee extension    Ankle dorsiflexion    Ankle plantarflexion    Ankle inversion    Ankle eversion     (Blank rows = not tested)  LOWER EXTREMITY MMT:    MMT Right 12/26/2022 Left 12/26/2022  Hip flexion 5/5 5/5  Hip extension    Hip abduction    Hip adduction    Hip internal rotation    Hip external rotation    Knee flexion 5/5 5/5  Knee extension 5/5 5/5  Ankle dorsiflexion 5/5 5/5  Ankle plantarflexion    Ankle inversion    Ankle eversion     (Blank rows = not tested)  LUMBAR SPECIAL TESTS:  12/26/2022 (-) slump for radicular symptoms but Rt was painful for back.   FUNCTIONAL TESTS:  12/26/2022 18 inch chair transfer s UE assist with back pain noted with mid range hitch in movement due to pain.  GAIT: 12/26/2022 Independent ambulation in clinic.                                                                                                                                                                                                                    TODAY'S TREATMENT:  DATE:  01/09/2023  Therex: Lumbar AROM x 5 standing Nustep Lvl 6 10 mins UE/LE Green tband rows 2 x 15 bilaterally Green tband gh ext 2 x 15 bilaterally  Manual Compression Rt QL, skilled palpation with needling.   Trigger Point Dry-Needling  Treatment instructions: Expect mild to moderate muscle soreness. S/S of pneumothorax if dry needled over a lung field, and to seek immediate medical attention should they occur. Patient verbalized understanding of these instructions and education.  Patient Consent Given: Yes Education handout provided: Yes Muscles treated: Rt QL Treatment response/outcome: local twitch response noted.  Improved lumbar range and symptoms post needling.   TODAY'S TREATMENT:                                                                                                         DATE: 01/05/2023  Therex: Nustep Lvl 6 UE/LE 10 mins  Tband rows blue band 2 x 15 Tband gh ext blue band 2 x 15  Seated slr 2 x 10 bilaterally Seated pball press down 10 sec hold x 10  Manual Compression Rt QL, skilled palpation with needling.   Trigger Point Dry-Needling  Treatment instructions: Expect mild to moderate muscle soreness. S/S of pneumothorax if dry needled over a lung field, and to seek immediate medical attention should they occur. Patient verbalized understanding of these instructions and education.  Patient Consent Given: Yes Education handout provided: Yes Muscles treated: Rt QL Treatment response/outcome: local twitch response noted.  Improved lumbar range and symptoms post needling.   TODAY'S TREATMENT:                                                                                                         DATE: 12/26/2022  Therex:    HEP instruction/performance c cues for techniques, handout provided.  Trial set performed of each for  comprehension and symptom assessment.  See below for exercise list  Moist heat 5 mins during education post needling.   Manual Compression Rt QL, skilled palpation with needling.   Trigger Point Dry-Needling  Treatment instructions: Expect mild to moderate muscle soreness. S/S of pneumothorax if dry needled over a lung field, and to seek immediate medical attention should they occur. Patient verbalized understanding of these instructions and education.  Patient Consent Given: Yes Education handout provided: Yes Muscles treated: Rt QL Treatment response/outcome: local twitch response noted.  Improved lumbar range and symptoms post needling.    PATIENT EDUCATION:  01/05/2023 Education details: HEP update Person educated: Patient Education method: Programmer, multimedia, Demonstration, Verbal cues, and Handouts Education comprehension: verbalized understanding, returned demonstration, and verbal cues required  HOME EXERCISE PROGRAM: Access  Code: [email or web address] URL: [email or web address] Date: 01/05/2023 Prepared by: Chyrel Masson  Exercises - Supine Lower Trunk Rotation  - 2-3 x daily - 7 x weekly - 1 sets - 3-5 reps - 15 hold - Supine Single Knee to Chest Stretch  - 2-3 x daily - 7 x weekly - 1 sets - 3-5 reps - 15 hold - Hooklying Isometric Hip Flexion  - 1-2 x daily - 7 x weekly - 1 sets - 10 reps - 5-10 hold - Seated Multifidi Isometric  - 1-2 x daily - 7 x weekly - 1 sets - 10 reps - 5-10 hold - Standing Shoulder Row with Anchored Resistance  - 1-2 x daily - 7 x weekly - 1-2 sets - 10-15 reps - Shoulder Extension with Resistance  - 1-2 x daily - 7 x weekly - 1-2 sets - 10-15 reps - Seated Straight Leg Heel Taps  - 1-2 x daily - 7 x weekly - 3 sets - 10 reps  ASSESSMENT:  CLINICAL IMPRESSION: Continued improvement in symptoms noted and reported with improved FOTO score noted in reassessment today.  Will reduce frequency due to improvements with reassessment in approx a week to  reassess necessity for continued treatment.    OBJECTIVE IMPAIRMENTS: Abnormal gait, decreased activity tolerance, decreased coordination, decreased endurance, decreased mobility, difficulty walking, decreased ROM, hypomobility, increased fascial restrictions, impaired perceived functional ability, increased muscle spasms, impaired flexibility, improper body mechanics, and pain.   ACTIVITY LIMITATIONS: carrying, lifting, bending, sitting, standing, squatting, sleeping, stairs, transfers, bed mobility, bathing, dressing, and locomotion level  PARTICIPATION LIMITATIONS: meal prep, cleaning, laundry, interpersonal relationship, shopping, community activity, and yard work  PERSONAL FACTORS: Time since onset of injury/illness/exacerbation and Med hx:  DDD, depression, GERD, HTN, OA in LE, RA  are also affecting patient's functional outcome.   REHAB POTENTIAL: Good  CLINICAL DECISION MAKING: Stable/uncomplicated  EVALUATION COMPLEXITY: Low   GOALS: Goals reviewed with patient? Yes  SHORT TERM GOALS: (target date for Short term goals are 3 weeks 01/16/2023)  1. Patient will demonstrate independent use of home exercise program to maintain progress from in clinic treatments.  Goal status: Met   LONG TERM GOALS: (target dates for all long term goals are 10 weeks  03/06/2023 )   1. Patient will demonstrate/report pain at worst less than or equal to 2/10 to facilitate minimal limitation in daily activity secondary to pain symptoms.  Goal status: on going 01/09/2023   2. Patient will demonstrate independent use of home exercise program to facilitate ability to maintain/progress functional gains from skilled physical therapy services.  Goal status: on going 01/09/2023   3. Patient will demonstrate FOTO outcome > or = 57 % to indicate reduced disability due to condition.  Goal status: on going 01/09/2023   4. Patient will demonstrate lumbar extension 100 % WFL s symptoms to facilitate  upright standing, walking posture at PLOF s limitation.  Goal status: on going 01/09/2023   5.  Patient will demonstrate/report ability to ascend/descend stairs outside of house s restriction due to symptoms.   Goal status: on going 01/09/2023   6.  Patient will demonstrate/report ability to perform standing, walking > 30 mins for normal farm related activity.  Goal status: on going 01/09/2023    PLAN:  PT FREQUENCY: 1-2x/week  PT DURATION: 10 weeks  PLANNED INTERVENTIONS: Can include 29562- PT Re-evaluation, 97110-Therapeutic exercises, 97530- Therapeutic activity, [id number]- Neuromuscular re-education, 97535- Self Care, 97140- Manual therapy, [id number]- Gait training, [phone]- Orthotic Fit/training, [phone]- Canalith repositioning, [id number]- Aquatic  Therapy, 97014- Electrical stimulation (unattended), [id number]- Electrical stimulation (manual), 40347- Vasopneumatic device, [id number]- Ultrasound, [id number]- Traction (mechanical), 42595- Ionotophoresis 4mg /ml Dexamethasone, Patient/Family education, Balance training, Stair training, Taping, Dry Needling, Joint mobilization, Joint manipulation, Spinal manipulation, Spinal mobilization, Scar mobilization, Vestibular training, Visual/preceptual remediation/compensation, DME instructions, Cryotherapy, and Moist heat.  All performed as medically necessary.  All included unless contraindicated  PLAN FOR NEXT SESSION: DN as desired   Chyrel Masson, PT, DPT, OCS, ATC 01/09/23  11:30 AM

## 2023-01-11 ENCOUNTER — Encounter: Payer: Medicare HMO | Admitting: Rehabilitative and Restorative Service Providers"

## 2023-01-15 ENCOUNTER — Encounter: Payer: Self-pay | Admitting: Rehabilitative and Restorative Service Providers"

## 2023-01-15 ENCOUNTER — Ambulatory Visit: Payer: Medicare HMO | Admitting: Rehabilitative and Restorative Service Providers"

## 2023-01-15 DIAGNOSIS — M5459 Other low back pain: Secondary | ICD-10-CM

## 2023-01-15 DIAGNOSIS — R262 Difficulty in walking, not elsewhere classified: Secondary | ICD-10-CM

## 2023-01-15 NOTE — Therapy (Signed)
OUTPATIENT PHYSICAL THERAPY TREATMENT   Patient Name: Jessica Pacheco MRN: [id number] DOB:06/04/1975, 47 y.o., female Today's Date: 01/15/2023  END OF SESSION:  PT End of Session - 01/15/23 1414     Visit Number 4    Number of Visits 20    Date for PT Re-Evaluation 03/06/23    Authorization Type [employer] $20 copay    Progress Note Due on Visit 10    PT Start Time 1413    PT Stop Time 1452    PT Time Calculation (min) 39 min    Activity Tolerance Patient tolerated treatment well    Behavior During Therapy WFL for tasks assessed/performed                Past Medical History:  Diagnosis Date   Anxiety    Chronic right lower quadrant pain    DDD (degenerative disc disease), cervical    c6-7   Depression    Dyspnea    On exertion at times   GERD (gastroesophageal reflux disease)    WATCHES DIET   History of hypoglycemia    History of kidney stones    passed spontaneously   Hypertension    Hypothyroidism    OA (osteoarthritis)    knees , hips, feet   PONV (postoperative nausea and vomiting)    Rheumatoid arthritis(714.0) dx  2001  multiple sites   rheumotologist-  dr Molly Maduro gay w/ [facility]--- currently treated w/ oral arava and iv actemra   Shingles    SUI (stress urinary incontinence), female    Past Surgical History:  Procedure Laterality Date   COLONOSCOPY WITH PROPOFOL N/A 10/27/2021   Procedure: COLONOSCOPY WITH PROPOFOL;  Surgeon: Dolores Frame, MD;  Location: [facility];  Service: Gastroenterology;  Laterality: N/A;   DILATION AND CURETTAGE OF UTERUS     KNEE ARTHROSCOPY Right 1999   KNEE ARTHROSCOPY WITH MEDIAL MENISECTOMY Right 02/07/2018   Procedure: KNEE ARTHROSCOPY WITH PARTIAL LATERAL MENISECTOMY;  Surgeon: Vickki Hearing, MD;  Location: [facility];  Service: Orthopedics;  Laterality: Right;   LAPAROSCOPY N/A 02/16/2017   Procedure: LAPAROSCOPY DIAGNOSTIC;  Surgeon: Richarda Overlie, MD;  Location: [facility];   Service: Gynecology;  Laterality: N/A;   TOTAL HIP ARTHROPLASTY Right 07/30/2020   Procedure: RIGHT TOTAL HIP ARTHROPLASTY ANTERIOR APPROACH;  Surgeon: Kathryne Hitch, MD;  Location: [facility];  Service: Orthopedics;  Laterality: Right;   WISDOM TOOTH EXTRACTION     Patient Active Problem List   Diagnosis Date Noted   Status post total replacement of right hip 07/30/2020   Hypertensive disorder 05/14/2019   Hypothyroidism 05/14/2019   Migraine 05/14/2019   Ovarian endometriosis 05/14/2019   S/P right knee arthroscopy 02/07/18 02/07/2018   Meniscus, lateral, derangement, right    Synovitis of right knee    Arthritis of right knee    Osteoarthritis, multiple sites 01/14/2018   Rheumatoid arthritis of multiple sites with negative rheumatoid factor (HCC) 05/08/2016   Primary osteoarthritis of both knees 08/30/2015   Primary osteoarthritis of right hip 08/30/2015   Primary osteoarthritis of left foot 06/30/2014   Change in stool 07/27/2011   Contraception 01/02/2011   Rheumatoid arthritis (HCC) 02/21/1999    PCP: Carylon Perches.  MD  REFERRING PROVIDER: Juanda Chance, NP  REFERRING DIAG: M54.42,M54.41,G89.29 (ICD-10-CM) - Chronic bilateral low back pain with bilateral sciatica M54.16 (ICD-10-CM) - Lumbar radiculopathy M43.06 (ICD-10-CM) - Pars defect of lumbar spine M43.16 (ICD-10-CM) - Anterolisthesis of lumbar spine M79.18 (ICD-10-CM) -  Myofascial pain syndrome  Rationale for Evaluation and Treatment: Rehabilitation  THERAPY DIAG:  Other low back pain  Difficulty in walking, not elsewhere classified  ONSET DATE: Acute on chronic (for years), worse since 8 or 10/2022.    SUBJECTIVE:                                                                                                                                                                                           SUBJECTIVE STATEMENT: Pt indicated feeling 1/10 in Rt lumbar and a little more in lower section of  back/buttock.   PERTINENT HISTORY:  "chiropractic treatments with Dr. Dr. Charissa Bash with [facility], reported significant relief of pain with dry needling. " History of right L3-L4 interlaminar epidural steroid injection in our office on 01/17/2022, she reports significant relief of pain for 3 months, greater than 50% relief of pain with this injection. We repeated this injection on 09/21/2022,   Rt hip replacement June 2022 Med hx:  DDD, depression, GERD, HTN, OA in LE, RA  PAIN:  NPRS scale: arrival 1.5/10 Pain location: back Rt > Lt  Pain description: ache/tightness with some increase sharp at times.  Aggravating factors: walking, housework with sweeping, mopping etc. , stairs.  Relieving factors: chiro visits,exercises at times, muscle relaxer at time.   PRECAUTIONS: None  WEIGHT BEARING RESTRICTIONS: No  FALLS:  Has patient fallen in last 6 months? No  LIVING ENVIRONMENT: Lives in: House/apartment Stairs: has stairs but bedroom on 1st floor.  2 stairs to enter.  OCCUPATION: xray tech history but on disability  PLOF: Independent ,  time at farm with interaction, walking when tolerated.   [employer] for pool activity.   PATIENT GOALS: Reduce pain/avoid surgery   OBJECTIVE:   DIAGNOSTIC FINDINGS:  12/26/2022 Lumbar MRI imaging from 2023 exhibits grade 1 anterolisthesis at L3-L4 due to bilateral L3 pars defects.There is also mild right lateral recess and bilateral foraminal stenosis at this level. No high grade spinal canal stenosis.   PATIENT SURVEYS:  01/09/2023: FOTO update: 63  12/26/2022 FOTO eval:  40  predicted:  57  SCREENING FOR RED FLAGS: 12/26/2022 Bowel or bladder incontinence: No Cauda equina syndrome: No  COGNITION: 12/26/2022 Overall cognitive status: WFL normal      SENSATION: 12/26/2022 WFL  MUSCLE LENGTH: 12/26/2022 No specific testing  POSTURE:  12/26/2022 No lateral shift noted in standing posture.   PALPATION: 12/26/2022 Specific  pain with trigger points in Rt QL > Lt QL but present bilaterally.  Paraspinals guarding noted as well.   LUMBAR ROM:   AROM 12/26/2022 01/05/2023  Flexion To mid shin with pailful arc noted  with pain on Rt side To toes with discomfort on Rt lumbar  Extension 25% with end range pain  Rt lumbar  Repeated x 5 with improvement to 75% with pain still noted  100 % WFL with some discomfort  Repeated x 5 : same discomfort  Right lateral flexion    Left lateral flexion    Right rotation    Left rotation     (Blank rows = not tested)  LOWER EXTREMITY ROM:      Right 12/26/2022 Left 12/26/2022  Hip flexion    Hip extension    Hip abduction    Hip adduction    Hip internal rotation    Hip external rotation    Knee flexion    Knee extension    Ankle dorsiflexion    Ankle plantarflexion    Ankle inversion    Ankle eversion     (Blank rows = not tested)  LOWER EXTREMITY MMT:    MMT Right 12/26/2022 Left 12/26/2022  Hip flexion 5/5 5/5  Hip extension    Hip abduction    Hip adduction    Hip internal rotation    Hip external rotation    Knee flexion 5/5 5/5  Knee extension 5/5 5/5  Ankle dorsiflexion 5/5 5/5  Ankle plantarflexion    Ankle inversion    Ankle eversion     (Blank rows = not tested)  LUMBAR SPECIAL TESTS:  12/26/2022 (-) slump for radicular symptoms but Rt was painful for back.   FUNCTIONAL TESTS:  12/26/2022 18 inch chair transfer s UE assist with back pain noted with mid range hitch in movement due to pain.  GAIT: 12/26/2022 Independent ambulation in clinic.                                                                                                                                                                                                                   TODAY'S TREATMENT:  DATE:  01/15/2023  Manual Compression Rt QL, skilled palpation with  needling.   Trigger Point Dry-Needling  Treatment instructions: Expect mild to moderate muscle soreness. S/S of pneumothorax if dry needled over a lung field, and to seek immediate medical attention should they occur. Patient verbalized understanding of these instructions and education.  Patient Consent Given: Yes Education handout provided: Yes Muscles treated: Rt QL inferior/ superior glute max Treatment response/outcome: local twitch response noted.  Improved lumbar range and symptoms post needling.   Therex: Prone opposite arm/leg lift 2-3 sec hold x 10 bilateral Nustep Lvl 6 10 mins UE/LE Standing blue band anti rotation walk out holds with arms in front 5 sec hold x 10 , performed bilaterally Standing blue band rows 2 x 15 Standing blue band GH ext 2 x 15   TODAY'S TREATMENT:                                                                                                         DATE:  01/09/2023  Therex: Lumbar AROM x 5 standing Nustep Lvl 6 10 mins UE/LE Green tband rows 2 x 15 bilaterally Green tband gh ext 2 x 15 bilaterally  Manual Compression Rt QL, skilled palpation with needling.   Trigger Point Dry-Needling  Treatment instructions: Expect mild to moderate muscle soreness. S/S of pneumothorax if dry needled over a lung field, and to seek immediate medical attention should they occur. Patient verbalized understanding of these instructions and education.  Patient Consent Given: Yes Education handout provided: Yes Muscles treated: Rt QL Treatment response/outcome: local twitch response noted.  Improved lumbar range and symptoms post needling.   TODAY'S TREATMENT:                                                                                                         DATE: 01/05/2023  Therex: Nustep Lvl 6 UE/LE 10 mins  Tband rows blue band 2 x 15 Tband gh ext blue band 2 x 15  Seated slr 2 x 10 bilaterally Seated pball press down 10 sec hold x  10  Manual Compression Rt QL, skilled palpation with needling.   Trigger Point Dry-Needling  Treatment instructions: Expect mild to moderate muscle soreness. S/S of pneumothorax if dry needled over a lung field, and to seek immediate medical attention should they occur. Patient verbalized understanding of these instructions and education.  Patient Consent Given: Yes Education handout provided: Yes Muscles treated: Rt QL Treatment response/outcome: local twitch response noted.  Improved lumbar range and symptoms post needling.    PATIENT EDUCATION:  01/05/2023 Education details: HEP update Person educated: Patient Education method: Explanation,  Demonstration, Verbal cues, and Handouts Education comprehension: verbalized understanding, returned demonstration, and verbal cues required  HOME EXERCISE PROGRAM: Access Code: [email or web address] URL: [email or web address] Date: 01/05/2023 Prepared by: Chyrel Masson  Exercises - Supine Lower Trunk Rotation  - 2-3 x daily - 7 x weekly - 1 sets - 3-5 reps - 15 hold - Supine Single Knee to Chest Stretch  - 2-3 x daily - 7 x weekly - 1 sets - 3-5 reps - 15 hold - Hooklying Isometric Hip Flexion  - 1-2 x daily - 7 x weekly - 1 sets - 10 reps - 5-10 hold - Seated Multifidi Isometric  - 1-2 x daily - 7 x weekly - 1 sets - 10 reps - 5-10 hold - Standing Shoulder Row with Anchored Resistance  - 1-2 x daily - 7 x weekly - 1-2 sets - 10-15 reps - Shoulder Extension with Resistance  - 1-2 x daily - 7 x weekly - 1-2 sets - 10-15 reps - Seated Straight Leg Heel Taps  - 1-2 x daily - 7 x weekly - 3 sets - 10 reps  ASSESSMENT:  CLINICAL IMPRESSION: Focused treatment more on lower lumbar/superior glute max to continued to help improvements in that area as noted in superior lumbar region.  Progressed to include prone posterior chain activation lift offs to continued strength.   OBJECTIVE IMPAIRMENTS: Abnormal gait, decreased activity tolerance,  decreased coordination, decreased endurance, decreased mobility, difficulty walking, decreased ROM, hypomobility, increased fascial restrictions, impaired perceived functional ability, increased muscle spasms, impaired flexibility, improper body mechanics, and pain.   ACTIVITY LIMITATIONS: carrying, lifting, bending, sitting, standing, squatting, sleeping, stairs, transfers, bed mobility, bathing, dressing, and locomotion level  PARTICIPATION LIMITATIONS: meal prep, cleaning, laundry, interpersonal relationship, shopping, community activity, and yard work  PERSONAL FACTORS: Time since onset of injury/illness/exacerbation and Med hx:  DDD, depression, GERD, HTN, OA in LE, RA  are also affecting patient's functional outcome.   REHAB POTENTIAL: Good  CLINICAL DECISION MAKING: Stable/uncomplicated  EVALUATION COMPLEXITY: Low   GOALS: Goals reviewed with patient? Yes  SHORT TERM GOALS: (target date for Short term goals are 3 weeks 01/16/2023)  1. Patient will demonstrate independent use of home exercise program to maintain progress from in clinic treatments.  Goal status: Met   LONG TERM GOALS: (target dates for all long term goals are 10 weeks  03/06/2023 )   1. Patient will demonstrate/report pain at worst less than or equal to 2/10 to facilitate minimal limitation in daily activity secondary to pain symptoms.  Goal status: on going 01/09/2023   2. Patient will demonstrate independent use of home exercise program to facilitate ability to maintain/progress functional gains from skilled physical therapy services.  Goal status: on going 01/09/2023   3. Patient will demonstrate FOTO outcome > or = 57 % to indicate reduced disability due to condition.  Goal status: on going 01/09/2023   4. Patient will demonstrate lumbar extension 100 % WFL s symptoms to facilitate upright standing, walking posture at PLOF s limitation.  Goal status: on going 01/09/2023   5.  Patient will  demonstrate/report ability to ascend/descend stairs outside of house s restriction due to symptoms.   Goal status: on going 01/09/2023   6.  Patient will demonstrate/report ability to perform standing, walking > 30 mins for normal farm related activity.  Goal status: on going 01/09/2023    PLAN:  PT FREQUENCY: 1-2x/week  PT DURATION: 10 weeks  PLANNED INTERVENTIONS: Can include 40981- PT Re-evaluation, 97110-Therapeutic exercises, 97530- Therapeutic activity,  96295- Neuromuscular re-education, [phone]- Self Care, 24401- Manual therapy, [id number]- Gait training, [phone]- Orthotic Fit/training, [id number]- Canalith repositioning, [id number]- Aquatic Therapy, 97014- Electrical stimulation (unattended), [id number]- Electrical stimulation (manual), [id number]- Vasopneumatic device, [id number]- Ultrasound, [id number]- Traction (mechanical), [id number]- Ionotophoresis 4mg /ml Dexamethasone, Patient/Family education, Balance training, Stair training, Taping, Dry Needling, Joint mobilization, Joint manipulation, Spinal manipulation, Spinal mobilization, Scar mobilization, Vestibular training, Visual/preceptual remediation/compensation, DME instructions, Cryotherapy, and Moist heat.  All performed as medically necessary.  All included unless contraindicated  PLAN FOR NEXT SESSION: Recheck symptoms, possible HEP transitioning.    Chyrel Masson, PT, DPT, OCS, ATC 01/15/23  2:53 PM

## 2023-01-16 DIAGNOSIS — S233XXA Sprain of ligaments of thoracic spine, initial encounter: Secondary | ICD-10-CM | POA: Diagnosis not present

## 2023-01-16 DIAGNOSIS — M9903 Segmental and somatic dysfunction of lumbar region: Secondary | ICD-10-CM | POA: Diagnosis not present

## 2023-01-16 DIAGNOSIS — S338XXA Sprain of other parts of lumbar spine and pelvis, initial encounter: Secondary | ICD-10-CM | POA: Diagnosis not present

## 2023-01-16 DIAGNOSIS — M9902 Segmental and somatic dysfunction of thoracic region: Secondary | ICD-10-CM | POA: Diagnosis not present

## 2023-01-18 DIAGNOSIS — S22000A Wedge compression fracture of unspecified thoracic vertebra, initial encounter for closed fracture: Secondary | ICD-10-CM | POA: Insufficient documentation

## 2023-01-22 ENCOUNTER — Encounter: Payer: Self-pay | Admitting: Rehabilitative and Restorative Service Providers"

## 2023-01-22 ENCOUNTER — Ambulatory Visit: Payer: Medicare HMO | Admitting: Rehabilitative and Restorative Service Providers"

## 2023-01-22 DIAGNOSIS — M5459 Other low back pain: Secondary | ICD-10-CM | POA: Diagnosis not present

## 2023-01-22 DIAGNOSIS — R262 Difficulty in walking, not elsewhere classified: Secondary | ICD-10-CM | POA: Diagnosis not present

## 2023-01-22 NOTE — Therapy (Signed)
OUTPATIENT PHYSICAL THERAPY TREATMENT   Patient Name: Jessica Pacheco MRN: [id number] DOB:02-05-1976, 47 y.o., female Today's Date: 01/22/2023  END OF SESSION:  PT End of Session - 01/22/23 1056     Visit Number 5    Number of Visits 20    Date for PT Re-Evaluation 03/06/23    Authorization Type [employer] $20 copay    Progress Note Due on Visit 10    PT Start Time 1053    PT Stop Time 1134    PT Time Calculation (min) 41 min    Activity Tolerance Patient limited by pain    Behavior During Therapy WFL for tasks assessed/performed                 Past Medical History:  Diagnosis Date   Anxiety    Chronic right lower quadrant pain    DDD (degenerative disc disease), cervical    c6-7   Depression    Dyspnea    On exertion at times   GERD (gastroesophageal reflux disease)    WATCHES DIET   History of hypoglycemia    History of kidney stones    passed spontaneously   Hypertension    Hypothyroidism    OA (osteoarthritis)    knees , hips, feet   PONV (postoperative nausea and vomiting)    Rheumatoid arthritis(714.0) dx  2001  multiple sites   rheumotologist-  dr Molly Maduro gay w/ [facility]--- currently treated w/ oral arava and iv actemra   Shingles    SUI (stress urinary incontinence), female    Past Surgical History:  Procedure Laterality Date   COLONOSCOPY WITH PROPOFOL N/A 10/27/2021   Procedure: COLONOSCOPY WITH PROPOFOL;  Surgeon: Dolores Frame, MD;  Location: [facility];  Service: Gastroenterology;  Laterality: N/A;   DILATION AND CURETTAGE OF UTERUS     KNEE ARTHROSCOPY Right 1999   KNEE ARTHROSCOPY WITH MEDIAL MENISECTOMY Right 02/07/2018   Procedure: KNEE ARTHROSCOPY WITH PARTIAL LATERAL MENISECTOMY;  Surgeon: Vickki Hearing, MD;  Location: [facility];  Service: Orthopedics;  Laterality: Right;   LAPAROSCOPY N/A 02/16/2017   Procedure: LAPAROSCOPY DIAGNOSTIC;  Surgeon: Richarda Overlie, MD;  Location: [facility];  Service:  Gynecology;  Laterality: N/A;   TOTAL HIP ARTHROPLASTY Right 07/30/2020   Procedure: RIGHT TOTAL HIP ARTHROPLASTY ANTERIOR APPROACH;  Surgeon: Kathryne Hitch, MD;  Location: [facility];  Service: Orthopedics;  Laterality: Right;   WISDOM TOOTH EXTRACTION     Patient Active Problem List   Diagnosis Date Noted   Status post total replacement of right hip 07/30/2020   Hypertensive disorder 05/14/2019   Hypothyroidism 05/14/2019   Migraine 05/14/2019   Ovarian endometriosis 05/14/2019   S/P right knee arthroscopy 02/07/18 02/07/2018   Meniscus, lateral, derangement, right    Synovitis of right knee    Arthritis of right knee    Osteoarthritis, multiple sites 01/14/2018   Rheumatoid arthritis of multiple sites with negative rheumatoid factor (HCC) 05/08/2016   Primary osteoarthritis of both knees 08/30/2015   Primary osteoarthritis of right hip 08/30/2015   Primary osteoarthritis of left foot 06/30/2014   Change in stool 07/27/2011   Contraception 01/02/2011   Rheumatoid arthritis (HCC) 02/21/1999    PCP: Carylon Perches.  MD  REFERRING PROVIDER: Juanda Chance, NP  REFERRING DIAG: M54.42,M54.41,G89.29 (ICD-10-CM) - Chronic bilateral low back pain with bilateral sciatica M54.16 (ICD-10-CM) - Lumbar radiculopathy M43.06 (ICD-10-CM) - Pars defect of lumbar spine M43.16 (ICD-10-CM) - Anterolisthesis of lumbar spine M79.18 (ICD-10-CM) -  Myofascial pain syndrome  Rationale for Evaluation and Treatment: Rehabilitation  THERAPY DIAG:  Other low back pain  Difficulty in walking, not elsewhere classified  ONSET DATE: Acute on chronic (for years), worse since 8 or 10/2022.    SUBJECTIVE:                                                                                                                                                                                           SUBJECTIVE STATEMENT: Pt indicated falling down stairs that led to bottom pain and pain in between shoulders.   Went to beach with difficulty sitting in drive.  Pt indicated usual Rt back pain wasn't worsened by the symptoms.   PERTINENT HISTORY:  "chiropractic treatments with Dr. Dr. Charissa Bash with [facility], reported significant relief of pain with dry needling. " History of right L3-L4 interlaminar epidural steroid injection in our office on 01/17/2022, she reports significant relief of pain for 3 months, greater than 50% relief of pain with this injection. We repeated this injection on 09/21/2022,   Rt hip replacement June 2022 Med hx:  DDD, depression, GERD, HTN, OA in LE, RA  PAIN:  NPRS scale: 8/10 Pain location: shoulder mid back  /buttocks  Pain description: ache/tightness with some increase sharp at times.  Aggravating factors: fall on stairs, sitting presssure Relieving factors: unsure yet  PRECAUTIONS: None  WEIGHT BEARING RESTRICTIONS: No  FALLS:  Has patient fallen in last 6 months? No  LIVING ENVIRONMENT: Lives in: House/apartment Stairs: has stairs but bedroom on 1st floor.  2 stairs to enter.  OCCUPATION: xray tech history but on disability  PLOF: Independent ,  time at farm with interaction, walking when tolerated.   [employer] for pool activity.   PATIENT GOALS: Reduce pain/avoid surgery   OBJECTIVE:   DIAGNOSTIC FINDINGS:  12/26/2022 Lumbar MRI imaging from 2023 exhibits grade 1 anterolisthesis at L3-L4 due to bilateral L3 pars defects.There is also mild right lateral recess and bilateral foraminal stenosis at this level. No high grade spinal canal stenosis.   PATIENT SURVEYS:  01/09/2023: FOTO update: 63  12/26/2022 FOTO eval:  40  predicted:  57  SCREENING FOR RED FLAGS: 12/26/2022 Bowel or bladder incontinence: No Cauda equina syndrome: No  COGNITION: 12/26/2022 Overall cognitive status: WFL normal      SENSATION: 12/26/2022 WFL  MUSCLE LENGTH: 12/26/2022 No specific testing  POSTURE:  12/26/2022 No lateral shift noted in standing posture.    PALPATION: 12/26/2022 Specific pain with trigger points in Rt QL > Lt QL but present bilaterally.  Paraspinals guarding noted as well.   LUMBAR ROM:   AROM 12/26/2022 01/05/2023  Flexion To mid shin with pailful arc noted with pain on Rt side To toes with discomfort on Rt lumbar  Extension 25% with end range pain  Rt lumbar  Repeated x 5 with improvement to 75% with pain still noted  100 % WFL with some discomfort  Repeated x 5 : same discomfort  Right lateral flexion    Left lateral flexion    Right rotation    Left rotation     (Blank rows = not tested)  LOWER EXTREMITY ROM:      Right 12/26/2022 Left 12/26/2022  Hip flexion    Hip extension    Hip abduction    Hip adduction    Hip internal rotation    Hip external rotation    Knee flexion    Knee extension    Ankle dorsiflexion    Ankle plantarflexion    Ankle inversion    Ankle eversion     (Blank rows = not tested)  LOWER EXTREMITY MMT:    MMT Right 12/26/2022 Left 12/26/2022  Hip flexion 5/5 5/5  Hip extension    Hip abduction    Hip adduction    Hip internal rotation    Hip external rotation    Knee flexion 5/5 5/5  Knee extension 5/5 5/5  Ankle dorsiflexion 5/5 5/5  Ankle plantarflexion    Ankle inversion    Ankle eversion     (Blank rows = not tested)  LUMBAR SPECIAL TESTS:  12/26/2022 (-) slump for radicular symptoms but Rt was painful for back.   FUNCTIONAL TESTS:  12/26/2022 18 inch chair transfer s UE assist with back pain noted with mid range hitch in movement due to pain.  GAIT: 12/26/2022 Independent ambulation in clinic.                                                                                                                                                                                                                   TODAY'S TREATMENT:  DATE:  01/22/2023  Manual Percussive  device to bilateral thoracic/lumbar paraspinals, Rt QL  Therex: Nustep Lvl 6 UE/LE for mobility - 15 mins Standing green band rows 2 x 15 bilateral Standing Gh ext green band 2 x 15 bilateral Supine lumbar trunk rotation stretch 15 sec x 3 bilateral Supine bridge x 10  Supine figure 4 pull towards 30 sec x 3 bilateral   TODAY'S TREATMENT:                                                                                                         DATE:  01/15/2023  Manual Compression Rt QL, skilled palpation with needling.   Trigger Point Dry-Needling  Treatment instructions: Expect mild to moderate muscle soreness. S/S of pneumothorax if dry needled over a lung field, and to seek immediate medical attention should they occur. Patient verbalized understanding of these instructions and education.  Patient Consent Given: Yes Education handout provided: Yes Muscles treated: Rt QL inferior/ superior glute max Treatment response/outcome: local twitch response noted.  Improved lumbar range and symptoms post needling.   Therex: Prone opposite arm/leg lift 2-3 sec hold x 10 bilateral Nustep Lvl 6 10 mins UE/LE Standing blue band anti rotation walk out holds with arms in front 5 sec hold x 10 , performed bilaterally Standing blue band rows 2 x 15 Standing blue band GH ext 2 x 15   TODAY'S TREATMENT:                                                                                                         DATE:  01/09/2023  Therex: Lumbar AROM x 5 standing Nustep Lvl 6 10 mins UE/LE Green tband rows 2 x 15 bilaterally Green tband gh ext 2 x 15 bilaterally  Manual Compression Rt QL, skilled palpation with needling.   Trigger Point Dry-Needling  Treatment instructions: Expect mild to moderate muscle soreness. S/S of pneumothorax if dry needled over a lung field, and to seek immediate medical attention should they occur. Patient verbalized understanding of these instructions and  education.  Patient Consent Given: Yes Education handout provided: Yes Muscles treated: Rt QL Treatment response/outcome: local twitch response noted.  Improved lumbar range and symptoms post needling.   TODAY'S TREATMENT:  DATE: 01/05/2023  Therex: Nustep Lvl 6 UE/LE 10 mins  Tband rows blue band 2 x 15 Tband gh ext blue band 2 x 15  Seated slr 2 x 10 bilaterally Seated pball press down 10 sec hold x 10  Manual Compression Rt QL, skilled palpation with needling.   Trigger Point Dry-Needling  Treatment instructions: Expect mild to moderate muscle soreness. S/S of pneumothorax if dry needled over a lung field, and to seek immediate medical attention should they occur. Patient verbalized understanding of these instructions and education.  Patient Consent Given: Yes Education handout provided: Yes Muscles treated: Rt QL Treatment response/outcome: local twitch response noted.  Improved lumbar range and symptoms post needling.    PATIENT EDUCATION:  01/05/2023 Education details: HEP update Person educated: Patient Education method: Programmer, multimedia, Demonstration, Verbal cues, and Handouts Education comprehension: verbalized understanding, returned demonstration, and verbal cues required  HOME EXERCISE PROGRAM: Access Code: [email or web address] URL: [email or web address] Date: 01/05/2023 Prepared by: Chyrel Masson  Exercises - Supine Lower Trunk Rotation  - 2-3 x daily - 7 x weekly - 1 sets - 3-5 reps - 15 hold - Supine Single Knee to Chest Stretch  - 2-3 x daily - 7 x weekly - 1 sets - 3-5 reps - 15 hold - Hooklying Isometric Hip Flexion  - 1-2 x daily - 7 x weekly - 1 sets - 10 reps - 5-10 hold - Seated Multifidi Isometric  - 1-2 x daily - 7 x weekly - 1 sets - 10 reps - 5-10 hold - Standing Shoulder Row with Anchored Resistance  - 1-2 x daily - 7 x weekly - 1-2 sets - 10-15  reps - Shoulder Extension with Resistance  - 1-2 x daily - 7 x weekly - 1-2 sets - 10-15 reps - Seated Straight Leg Heel Taps  - 1-2 x daily - 7 x weekly - 3 sets - 10 reps  ASSESSMENT:  CLINICAL IMPRESSION: Adjusted intervention today to adapt to complaints related to fall on steps.    OBJECTIVE IMPAIRMENTS: Abnormal gait, decreased activity tolerance, decreased coordination, decreased endurance, decreased mobility, difficulty walking, decreased ROM, hypomobility, increased fascial restrictions, impaired perceived functional ability, increased muscle spasms, impaired flexibility, improper body mechanics, and pain.   ACTIVITY LIMITATIONS: carrying, lifting, bending, sitting, standing, squatting, sleeping, stairs, transfers, bed mobility, bathing, dressing, and locomotion level  PARTICIPATION LIMITATIONS: meal prep, cleaning, laundry, interpersonal relationship, shopping, community activity, and yard work  PERSONAL FACTORS: Time since onset of injury/illness/exacerbation and Med hx:  DDD, depression, GERD, HTN, OA in LE, RA  are also affecting patient's functional outcome.   REHAB POTENTIAL: Good  CLINICAL DECISION MAKING: Stable/uncomplicated  EVALUATION COMPLEXITY: Low   GOALS: Goals reviewed with patient? Yes  SHORT TERM GOALS: (target date for Short term goals are 3 weeks 01/16/2023)  1. Patient will demonstrate independent use of home exercise program to maintain progress from in clinic treatments.  Goal status: Met   LONG TERM GOALS: (target dates for all long term goals are 10 weeks  03/06/2023 )   1. Patient will demonstrate/report pain at worst less than or equal to 2/10 to facilitate minimal limitation in daily activity secondary to pain symptoms.  Goal status: on going 01/09/2023   2. Patient will demonstrate independent use of home exercise program to facilitate ability to maintain/progress functional gains from skilled physical therapy services.  Goal status: on  going 01/09/2023   3. Patient will demonstrate FOTO outcome > or = 57 % to indicate reduced disability due to  condition.  Goal status: on going 01/09/2023   4. Patient will demonstrate lumbar extension 100 % WFL s symptoms to facilitate upright standing, walking posture at PLOF s limitation.  Goal status: on going 01/09/2023   5.  Patient will demonstrate/report ability to ascend/descend stairs outside of house s restriction due to symptoms.   Goal status: on going 01/09/2023   6.  Patient will demonstrate/report ability to perform standing, walking > 30 mins for normal farm related activity.  Goal status: on going 01/09/2023    PLAN:  PT FREQUENCY: 1-2x/week  PT DURATION: 10 weeks  PLANNED INTERVENTIONS: Can include 53664- PT Re-evaluation, 97110-Therapeutic exercises, 97530- Therapeutic activity, 97112- Neuromuscular re-education, 97535- Self Care, 97140- Manual therapy, [id number]- Gait training, [id number]- Orthotic Fit/training, [phone]- Canalith repositioning, [id number]- Aquatic Therapy, 97014- Electrical stimulation (unattended), [id number]- Electrical stimulation (manual), [id number]- Vasopneumatic device, [id number]- Ultrasound, [id number]- Traction (mechanical), [id number]- Ionotophoresis 4mg /ml Dexamethasone, Patient/Family education, Balance training, Stair training, Taping, Dry Needling, Joint mobilization, Joint manipulation, Spinal manipulation, Spinal mobilization, Scar mobilization, Vestibular training, Visual/preceptual remediation/compensation, DME instructions, Cryotherapy, and Moist heat.  All performed as medically necessary.  All included unless contraindicated  PLAN FOR NEXT SESSION: Follow up on new symptoms from fall.    Chyrel Masson, PT, DPT, OCS, ATC 01/22/23  11:37 AM

## 2023-01-25 ENCOUNTER — Encounter: Payer: Medicare HMO | Admitting: Rehabilitative and Restorative Service Providers"

## 2023-01-25 DIAGNOSIS — M9903 Segmental and somatic dysfunction of lumbar region: Secondary | ICD-10-CM | POA: Diagnosis not present

## 2023-01-25 DIAGNOSIS — M9902 Segmental and somatic dysfunction of thoracic region: Secondary | ICD-10-CM | POA: Diagnosis not present

## 2023-01-25 DIAGNOSIS — S338XXA Sprain of other parts of lumbar spine and pelvis, initial encounter: Secondary | ICD-10-CM | POA: Diagnosis not present

## 2023-01-25 DIAGNOSIS — S233XXA Sprain of ligaments of thoracic spine, initial encounter: Secondary | ICD-10-CM | POA: Diagnosis not present

## 2023-01-29 DIAGNOSIS — M9902 Segmental and somatic dysfunction of thoracic region: Secondary | ICD-10-CM | POA: Diagnosis not present

## 2023-01-29 DIAGNOSIS — S338XXA Sprain of other parts of lumbar spine and pelvis, initial encounter: Secondary | ICD-10-CM | POA: Diagnosis not present

## 2023-01-29 DIAGNOSIS — M9903 Segmental and somatic dysfunction of lumbar region: Secondary | ICD-10-CM | POA: Diagnosis not present

## 2023-01-29 DIAGNOSIS — S233XXA Sprain of ligaments of thoracic spine, initial encounter: Secondary | ICD-10-CM | POA: Diagnosis not present

## 2023-01-30 ENCOUNTER — Ambulatory Visit: Payer: Medicare HMO | Admitting: Rehabilitative and Restorative Service Providers"

## 2023-01-30 ENCOUNTER — Encounter: Payer: Self-pay | Admitting: Rehabilitative and Restorative Service Providers"

## 2023-01-30 DIAGNOSIS — R262 Difficulty in walking, not elsewhere classified: Secondary | ICD-10-CM | POA: Diagnosis not present

## 2023-01-30 DIAGNOSIS — M5459 Other low back pain: Secondary | ICD-10-CM

## 2023-01-30 NOTE — Therapy (Signed)
OUTPATIENT PHYSICAL THERAPY TREATMENT   Patient Name: Jessica Pacheco MRN: [id number] DOB:05/17/1975, 47 y.o., female Today's Date: 01/30/2023  END OF SESSION:  PT End of Session - 01/30/23 1057     Visit Number 6    Number of Visits 20    Date for PT Re-Evaluation 03/06/23    Authorization Type [employer] $20 copay    Progress Note Due on Visit 10    PT Start Time 1053    PT Stop Time 1118    PT Time Calculation (min) 25 min    Activity Tolerance Patient tolerated treatment well    Behavior During Therapy WFL for tasks assessed/performed                  Past Medical History:  Diagnosis Date   Anxiety    Chronic right lower quadrant pain    DDD (degenerative disc disease), cervical    c6-7   Depression    Dyspnea    On exertion at times   GERD (gastroesophageal reflux disease)    WATCHES DIET   History of hypoglycemia    History of kidney stones    passed spontaneously   Hypertension    Hypothyroidism    OA (osteoarthritis)    knees , hips, feet   PONV (postoperative nausea and vomiting)    Rheumatoid arthritis(714.0) dx  2001  multiple sites   rheumotologist-  dr Molly Maduro gay w/ [facility]--- currently treated w/ oral arava and iv actemra   Shingles    SUI (stress urinary incontinence), female    Past Surgical History:  Procedure Laterality Date   COLONOSCOPY WITH PROPOFOL N/A 10/27/2021   Procedure: COLONOSCOPY WITH PROPOFOL;  Surgeon: Dolores Frame, MD;  Location: [facility];  Service: Gastroenterology;  Laterality: N/A;   DILATION AND CURETTAGE OF UTERUS     KNEE ARTHROSCOPY Right 1999   KNEE ARTHROSCOPY WITH MEDIAL MENISECTOMY Right 02/07/2018   Procedure: KNEE ARTHROSCOPY WITH PARTIAL LATERAL MENISECTOMY;  Surgeon: Vickki Hearing, MD;  Location: [facility];  Service: Orthopedics;  Laterality: Right;   LAPAROSCOPY N/A 02/16/2017   Procedure: LAPAROSCOPY DIAGNOSTIC;  Surgeon: Richarda Overlie, MD;  Location: [facility];  Service: Gynecology;  Laterality: N/A;   TOTAL HIP ARTHROPLASTY Right 07/30/2020   Procedure: RIGHT TOTAL HIP ARTHROPLASTY ANTERIOR APPROACH;  Surgeon: Kathryne Hitch, MD;  Location: [facility];  Service: Orthopedics;  Laterality: Right;   WISDOM TOOTH EXTRACTION     Patient Active Problem List   Diagnosis Date Noted   Status post total replacement of right hip 07/30/2020   Hypertensive disorder 05/14/2019   Hypothyroidism 05/14/2019   Migraine 05/14/2019   Ovarian endometriosis 05/14/2019   S/P right knee arthroscopy 02/07/18 02/07/2018   Meniscus, lateral, derangement, right    Synovitis of right knee    Arthritis of right knee    Osteoarthritis, multiple sites 01/14/2018   Rheumatoid arthritis of multiple sites with negative rheumatoid factor (HCC) 05/08/2016   Primary osteoarthritis of both knees 08/30/2015   Primary osteoarthritis of right hip 08/30/2015   Primary osteoarthritis of left foot 06/30/2014   Change in stool 07/27/2011   Contraception 01/02/2011   Rheumatoid arthritis (HCC) 02/21/1999    PCP: Carylon Perches.  MD  REFERRING PROVIDER: Juanda Chance, NP  REFERRING DIAG: M54.42,M54.41,G89.29 (ICD-10-CM) - Chronic bilateral low back pain with bilateral sciatica M54.16 (ICD-10-CM) - Lumbar radiculopathy M43.06 (ICD-10-CM) - Pars defect of lumbar spine M43.16 (ICD-10-CM) - Anterolisthesis of lumbar spine M79.18 (  ICD-10-CM) - Myofascial pain syndrome  Rationale for Evaluation and Treatment: Rehabilitation  THERAPY DIAG:  Other low back pain  Difficulty in walking, not elsewhere classified  ONSET DATE: Acute on chronic (for years), worse since 8 or 10/2022.    SUBJECTIVE:                                                                                                                                                                                           SUBJECTIVE STATEMENT: Pt indicated continued recovery following that recent fall.  Pt indicated  some mild symptoms in mid back upon arrival.   Pt indicated feeling the best for her back as she has in years.   PERTINENT HISTORY:  "chiropractic treatments with Dr. Dr. Charissa Bash with [facility], reported significant relief of pain with dry needling. " History of right L3-L4 interlaminar epidural steroid injection in our office on 01/17/2022, she reports significant relief of pain for 3 months, greater than 50% relief of pain with this injection. We repeated this injection on 09/21/2022,   Rt hip replacement June 2022 Med hx:  DDD, depression, GERD, HTN, OA in LE, RA  PAIN:  NPRS scale:at worst in last 3/10 Pain location: shoulder mid back  /buttocks  Pain description: ache/tightness with some increase sharp at times.  Aggravating factors: fall on stairs, sitting presssure Relieving factors: unsure yet  PRECAUTIONS: None  WEIGHT BEARING RESTRICTIONS: No  FALLS:  Has patient fallen in last 6 months? No  LIVING ENVIRONMENT: Lives in: House/apartment Stairs: has stairs but bedroom on 1st floor.  2 stairs to enter.  OCCUPATION: xray tech history but on disability  PLOF: Independent ,  time at farm with interaction, walking when tolerated.   [employer] for pool activity.   PATIENT GOALS: Reduce pain/avoid surgery   OBJECTIVE:   DIAGNOSTIC FINDINGS:  12/26/2022 Lumbar MRI imaging from 2023 exhibits grade 1 anterolisthesis at L3-L4 due to bilateral L3 pars defects.There is also mild right lateral recess and bilateral foraminal stenosis at this level. No high grade spinal canal stenosis.   PATIENT SURVEYS:  01/30/2023: FOTO update: 65  01/09/2023: FOTO update: 63  12/26/2022 FOTO eval:  40  predicted:  57  SCREENING FOR RED FLAGS: 12/26/2022 Bowel or bladder incontinence: No Cauda equina syndrome: No  COGNITION: 12/26/2022 Overall cognitive status: WFL normal      SENSATION: 12/26/2022 WFL  MUSCLE LENGTH: 12/26/2022 No specific testing  POSTURE:  12/26/2022 No  lateral shift noted in standing posture.   PALPATION: 12/26/2022 Specific pain with trigger points in Rt QL > Lt QL but present bilaterally.  Paraspinals guarding noted as well.  LUMBAR ROM:   AROM 12/26/2022 01/05/2023  Flexion To mid shin with pailful arc noted with pain on Rt side To toes with discomfort on Rt lumbar  Extension 25% with end range pain  Rt lumbar  Repeated x 5 with improvement to 75% with pain still noted  100 % WFL with some discomfort  Repeated x 5 : same discomfort  Right lateral flexion    Left lateral flexion    Right rotation    Left rotation     (Blank rows = not tested)  LOWER EXTREMITY ROM:      Right 12/26/2022 Left 12/26/2022  Hip flexion    Hip extension    Hip abduction    Hip adduction    Hip internal rotation    Hip external rotation    Knee flexion    Knee extension    Ankle dorsiflexion    Ankle plantarflexion    Ankle inversion    Ankle eversion     (Blank rows = not tested)  LOWER EXTREMITY MMT:    MMT Right 12/26/2022 Left 12/26/2022  Hip flexion 5/5 5/5  Hip extension    Hip abduction    Hip adduction    Hip internal rotation    Hip external rotation    Knee flexion 5/5 5/5  Knee extension 5/5 5/5  Ankle dorsiflexion 5/5 5/5  Ankle plantarflexion    Ankle inversion    Ankle eversion     (Blank rows = not tested)  LUMBAR SPECIAL TESTS:  12/26/2022 (-) slump for radicular symptoms but Rt was painful for back.   FUNCTIONAL TESTS:  12/26/2022 18 inch chair transfer s UE assist with back pain noted with mid range hitch in movement due to pain.  GAIT: 12/26/2022 Independent ambulation in clinic.                                                                                                                                                                                                                   TODAY'S TREATMENT:  DATE:  01/30/2023  Manual Percussive device to bilateral thoracic/lumbar paraspinals, Rt QL  Therex: Nustep Lvl 6 UE/LE for mobility - 15 mins Standing green band rows 2 x 15 bilateral Standing Gh ext green band 2 x 15 bilateral Supine lumbar trunk rotation stretch 15 sec x 3 bilateral Supine bridge x 10  Supine figure 4 pull towards 30 sec x 3 bilateral   TODAY'S TREATMENT:                                                                                                         DATE:  01/22/2023  Manual Percussive device to bilateral thoracic/lumbar paraspinals, Rt QL  Therex: Nustep Lvl 6 UE/LE for mobility - 15 mins   TODAY'S TREATMENT:                                                                                                         DATE:  01/15/2023  Manual Compression Rt QL, skilled palpation with needling.   Trigger Point Dry-Needling  Treatment instructions: Expect mild to moderate muscle soreness. S/S of pneumothorax if dry needled over a lung field, and to seek immediate medical attention should they occur. Patient verbalized understanding of these instructions and education.  Patient Consent Given: Yes Education handout provided: Yes Muscles treated: Rt QL inferior/ superior glute max Treatment response/outcome: local twitch response noted.  Improved lumbar range and symptoms post needling.   Therex: Prone opposite arm/leg lift 2-3 sec hold x 10 bilateral Nustep Lvl 6 10 mins UE/LE Standing blue band anti rotation walk out holds with arms in front 5 sec hold x 10 , performed bilaterally Standing blue band rows 2 x 15 Standing blue band GH ext 2 x 15   TODAY'S TREATMENT:                                                                                                         DATE:  01/09/2023  Therex: Lumbar AROM x 5 standing Nustep Lvl 6 10 mins UE/LE Green tband rows 2 x 15 bilaterally Green tband gh ext 2 x 15 bilaterally  Manual Compression Rt QL,  skilled palpation with needling.   Trigger Point Dry-Needling  Treatment  instructions: Expect mild to moderate muscle soreness. S/S of pneumothorax if dry needled over a lung field, and to seek immediate medical attention should they occur. Patient verbalized understanding of these instructions and education.  Patient Consent Given: Yes Education handout provided: Yes Muscles treated: Rt QL Treatment response/outcome: local twitch response noted.  Improved lumbar range and symptoms post needling.     PATIENT EDUCATION:  01/05/2023 Education details: HEP update Person educated: Patient Education method: Programmer, multimedia, Demonstration, Verbal cues, and Handouts Education comprehension: verbalized understanding, returned demonstration, and verbal cues required  HOME EXERCISE PROGRAM: Access Code: [email or web address] URL: [email or web address] Date: 01/05/2023 Prepared by: Chyrel Masson  Exercises - Supine Lower Trunk Rotation  - 2-3 x daily - 7 x weekly - 1 sets - 3-5 reps - 15 hold - Supine Single Knee to Chest Stretch  - 2-3 x daily - 7 x weekly - 1 sets - 3-5 reps - 15 hold - Hooklying Isometric Hip Flexion  - 1-2 x daily - 7 x weekly - 1 sets - 10 reps - 5-10 hold - Seated Multifidi Isometric  - 1-2 x daily - 7 x weekly - 1 sets - 10 reps - 5-10 hold - Standing Shoulder Row with Anchored Resistance  - 1-2 x daily - 7 x weekly - 1-2 sets - 10-15 reps - Shoulder Extension with Resistance  - 1-2 x daily - 7 x weekly - 1-2 sets - 10-15 reps - Seated Straight Leg Heel Taps  - 1-2 x daily - 7 x weekly - 3 sets - 10 reps  ASSESSMENT:  CLINICAL IMPRESSION: Due to overall symptom improvement, Pt was in agreement with plan for trial HEP to continue progression.  FOTO was improved greatly compared to eval.  Pt may return in future pending symptoms.   OBJECTIVE IMPAIRMENTS: Abnormal gait, decreased activity tolerance, decreased coordination, decreased endurance, decreased mobility,  difficulty walking, decreased ROM, hypomobility, increased fascial restrictions, impaired perceived functional ability, increased muscle spasms, impaired flexibility, improper body mechanics, and pain.   ACTIVITY LIMITATIONS: carrying, lifting, bending, sitting, standing, squatting, sleeping, stairs, transfers, bed mobility, bathing, dressing, and locomotion level  PARTICIPATION LIMITATIONS: meal prep, cleaning, laundry, interpersonal relationship, shopping, community activity, and yard work  PERSONAL FACTORS: Time since onset of injury/illness/exacerbation and Med hx:  DDD, depression, GERD, HTN, OA in LE, RA  are also affecting patient's functional outcome.   REHAB POTENTIAL: Good  CLINICAL DECISION MAKING: Stable/uncomplicated  EVALUATION COMPLEXITY: Low   GOALS: Goals reviewed with patient? Yes  SHORT TERM GOALS: (target date for Short term goals are 3 weeks 01/16/2023)  1. Patient will demonstrate independent use of home exercise program to maintain progress from in clinic treatments.  Goal status: Met   LONG TERM GOALS: (target dates for all long term goals are 10 weeks  03/06/2023 )   1. Patient will demonstrate/report pain at worst less than or equal to 2/10 to facilitate minimal limitation in daily activity secondary to pain symptoms.  Goal status: mostly met 01/30/2023   2. Patient will demonstrate independent use of home exercise program to facilitate ability to maintain/progress functional gains from skilled physical therapy services.  Goal status: met 01/30/2023   3. Patient will demonstrate FOTO outcome > or = 57 % to indicate reduced disability due to condition.  Goal status:met 01/30/2023   4. Patient will demonstrate lumbar extension 100 % WFL s symptoms to facilitate upright standing, walking posture at PLOF s limitation.  Goal status: met 01/30/2023   5.  Patient will  demonstrate/report ability to ascend/descend stairs outside of house s restriction due to  symptoms.   Goal status: met 01/30/2023   6.  Patient will demonstrate/report ability to perform standing, walking > 30 mins for normal farm related activity.  Goal status: met 01/30/2023    PLAN:  PT FREQUENCY: 1-2x/week  PT DURATION: 10 weeks  PLANNED INTERVENTIONS: Can include 86578- PT Re-evaluation, 97110-Therapeutic exercises, 97530- Therapeutic activity, 97112- Neuromuscular re-education, 97535- Self Care, 97140- Manual therapy, [phone]- Gait training, [phone]- Orthotic Fit/training, [phone]- Canalith repositioning, [id number]- Aquatic Therapy, 97014- Electrical stimulation (unattended), [id number]- Electrical stimulation (manual), [id number]- Vasopneumatic device, [id number]- Ultrasound, [id number]- Traction (mechanical), [id number]- Ionotophoresis 4mg /ml Dexamethasone, Patient/Family education, Balance training, Stair training, Taping, Dry Needling, Joint mobilization, Joint manipulation, Spinal manipulation, Spinal mobilization, Scar mobilization, Vestibular training, Visual/preceptual remediation/compensation, DME instructions, Cryotherapy, and Moist heat.  All performed as medically necessary.  All included unless contraindicated  PLAN FOR NEXT SESSION: Trial HEP period   Chyrel Masson, PT, DPT, OCS, ATC 01/30/23  11:25 AM

## 2023-02-01 ENCOUNTER — Encounter: Payer: Medicare HMO | Admitting: Rehabilitative and Restorative Service Providers"

## 2023-02-12 DIAGNOSIS — M9903 Segmental and somatic dysfunction of lumbar region: Secondary | ICD-10-CM | POA: Diagnosis not present

## 2023-02-12 DIAGNOSIS — M9902 Segmental and somatic dysfunction of thoracic region: Secondary | ICD-10-CM | POA: Diagnosis not present

## 2023-02-12 DIAGNOSIS — S233XXA Sprain of ligaments of thoracic spine, initial encounter: Secondary | ICD-10-CM | POA: Diagnosis not present

## 2023-02-12 DIAGNOSIS — S338XXA Sprain of other parts of lumbar spine and pelvis, initial encounter: Secondary | ICD-10-CM | POA: Diagnosis not present

## 2023-02-28 NOTE — Progress Notes (Signed)
 Jessica Pacheco, female    DOB: [birth year]   MRN: [id number]   Brief patient profile:  69   yowf never regular smoker with seasonal allergies as adult  rx otc referred to pulmonary clinic 03/01/2023 by Dr Sheryle for doe  onset p 1st covid 2020 and had 2 more      Wt p last child  150  in  2009   RA  1990s  > prednisone / methotrexate  > plaquenil   then Rinoq x around 2021   1st covid 2021 last covid summer of 2024  never hospitalized, paxlovid x 2   Atrial tach 2023  Oneal > metaprolol     History of Present Illness  03/01/2023  Pulmonary/ 1st office eval/[facility]  Chief Complaint  Patient presents with   Consult    Several episodes of SOB and dyspnea with exertion since COVID 2020.    Dyspnea:  walking at most quarter mile to neighbor's  house flat  grade sometimes has to stop half way varies sometimes  no trouble  Cough: rarely  Sleep: bed is flat / 2 pillows  SABA use: no better      No obvious day to day or daytime pattern/variability or assoc excess/ purulent sputum or mucus plugs or hemoptysis or cp or chest tightness, subjective wheeze or overt sinus or hb symptoms.    Also denies any obvious fluctuation of symptoms with weather or environmental changes or other aggravating or alleviating factors except as outlined above   No unusual exposure hx or h/o childhood pna/ asthma or knowledge of premature birth.  Current Allergies, Complete Past Medical History, Past Surgical History, Family History, and Social History were reviewed in [employer] record.  ROS  The following are not active complaints unless bolded Hoarseness, sore throat, dysphagia, dental problems, itching, sneezing,  nasal congestion or discharge of excess mucus or purulent secretions, ear ache,   fever, chills, sweats, unintended wt loss or wt gain, classically pleuritic or exertional cp,  orthopnea pnd or arm/hand swelling  or leg swelling, presyncope, palpitations, abdominal pain, anorexia,  nausea, vomiting, diarrhea  or change in bowel habits or change in bladder habits, change in stools or change in urine, dysuria, hematuria,  rash, arthralgias, visual complaints, headache, numbness, weakness or ataxia or problems with walking or coordination,  change in mood or  memory.             Outpatient Medications Prior to Visit  Medication Sig Dispense Refill   amLODipine  (NORVASC ) 10 MG tablet Take 10 mg by mouth at bedtime.  4   Ascorbic Acid  (VITAMIN C PO) Take 1 tablet by mouth at bedtime.     escitalopram  (LEXAPRO ) 20 MG tablet Take 20 mg by mouth in the morning.     glucosamine-chondroitin 500-400 MG tablet Take 1 tablet by mouth once.     hydrochlorothiazide  (HYDRODIURIL ) 25 MG tablet Take 25 mg by mouth daily after breakfast.     hydroxychloroquine  (PLAQUENIL ) 200 MG tablet Take 200 mg by mouth 2 (two) times daily.     ibuprofen  (ADVIL ) 200 MG tablet Take 800 mg by mouth every 8 (eight) hours as needed (pain.).     JUNEL FE 1/20 1-20 MG-MCG tablet Take 1 tablet by mouth every evening. Continuous (only takes active pills)     levothyroxine  (SYNTHROID ) 100 MCG tablet Take 100 mcg by mouth daily before breakfast.     loratadine  (CLARITIN ) 10 MG tablet Take 10 mg by mouth daily as  needed for allergies.     LORazepam (ATIVAN) 0.5 MG tablet Take 0.5 mg by mouth daily as needed for anxiety.     melatonin 3 MG TABS tablet Take 3 mg by mouth at bedtime as needed (sleep).     metoprolol  tartrate (LOPRESSOR ) 25 MG tablet TAKE 1 TABLET TWICE A DAY 180 tablet 2   rosuvastatin  (CRESTOR ) 10 MG tablet Take 1 tablet (10 mg total) by mouth daily. 90 tablet 3   Turmeric (QC TUMERIC COMPLEX) 500 MG CAPS Take by mouth daily.     Upadacitinib ER (RINVOQ) 15 MG TB24 Take 15 mg by mouth in the morning.     potassium chloride  SA (KLOR-CON  M) 20 MEQ tablet Take 2 tablets (40 mEq total) by mouth daily for 4 days. 8 tablet 0   No facility-administered medications prior to visit.    Past Medical  History:  Diagnosis Date   Anxiety    Chronic right lower quadrant pain    DDD (degenerative disc disease), cervical    c6-7   Depression    Dyspnea    On exertion at times   GERD (gastroesophageal reflux disease)    WATCHES DIET   History of hypoglycemia    History of kidney stones    passed spontaneously   Hypertension    Hypothyroidism    OA (osteoarthritis)    knees , hips, feet   PONV (postoperative nausea and vomiting)    Rheumatoid arthritis(714.0) dx  2001  multiple sites   rheumotologist-  dr lamar gay w/ [facility]--- currently treated w/ oral arava and iv actemra    Shingles    SUI (stress urinary incontinence), female       Objective:     BP 124/66 (BP Location: Left Arm, Patient Position: Sitting, Cuff Size: Large)   Pulse 88   Temp 98.4 F (36.9 C) (Oral)   Ht 5' 6.5 (1.689 m)   Wt 245 lb 6.4 oz (111.3 kg)   SpO2 99%   BMI 39.02 kg/m   SpO2: 99 %  RA   Amb mod obese (by BMI)  wf nad    HEENT : Oropharynx  clear      Nasal turbinates nl    NECK :  without  apparent JVD/ palpable Nodes/TM    LUNGS: no acc muscle use,  Nl contour chest which is clear to A and P bilaterally without cough on insp or exp maneuvers   CV:  RRR  no s3 or murmur or increase in P2, and no edema   ABD:  soft and nontender   MS:  Gait nl   ext warm without deformities Or obvious joint restrictions  calf tenderness, cyanosis or clubbing    SKIN: warm and dry without lesions    NEURO:  alert, approp, nl sensorium with  no motor or cerebellar deficits apparent.     CXR PA and Lateral:   03/01/2023 :    I personally reviewed images and impression is as follows:     No acute dz - specifically nothing to suggest ILD / RA or otherwise      Assessment   No problem-specific Assessment & Plan notes found for this encounter.     Ozell America, MD 03/01/2023

## 2023-03-01 ENCOUNTER — Encounter: Payer: Self-pay | Admitting: Internal Medicine

## 2023-03-01 ENCOUNTER — Ambulatory Visit (INDEPENDENT_AMBULATORY_CARE_PROVIDER_SITE_OTHER): Payer: Medicare HMO

## 2023-03-01 ENCOUNTER — Ambulatory Visit: Payer: Medicare HMO | Admitting: Internal Medicine

## 2023-03-01 VITALS — BP 124/66 | HR 88 | Temp 98.4°F | Ht 66.5 in | Wt 245.4 lb

## 2023-03-01 DIAGNOSIS — R0609 Other forms of dyspnea: Secondary | ICD-10-CM

## 2023-03-01 LAB — BRAIN NATRIURETIC PEPTIDE: Pro B Natriuretic peptide (BNP): 26 pg/mL (ref 0.0–100.0)

## 2023-03-01 LAB — TSH: TSH: 4.38 u[IU]/mL (ref 0.35–5.50)

## 2023-03-01 LAB — D-DIMER, QUANTITATIVE: D-Dimer, Quant: 0.57 ug{FEU}/mL — ABNORMAL HIGH (ref <0.50)

## 2023-03-01 NOTE — Assessment & Plan Note (Signed)
 Onset Dec 2020 with covid and never back to baseline - 03/01/2023   Walked on RA  x  3  lap(s) =  approx 750  ft  @ n pace, stopped due to end of study  with lowest 02 sats 99%  s sob    Strongly suspect this is a conditioning/ wt issue and not long covid or occult asthma or ILD  though PFTs should help as will observation of ex tol doing submax exercise on a regular basis and monitoring sats at peak ex  Will complete the lab profile for unexplained doe and await pfts and response to above recs with  CPST next step if next making progress; however , I think her prognosis is quit good.   Discussed in detail all the  indications, usual  risks and alternatives  relative to the benefits with patient who agrees to proceed with w/u as outlined.     Pulmonary f/u is prn   Comment: Symptoms are markedly disproportionate to objective findings and not clear to what extent this is actually a pulmonary  problem but pt does appear to have difficult to sort out respiratory symptoms of unknown origin for which  DDX  = almost all start with A and  include Adherence, Ace Inhibitors, Acid Reflux, Active Sinus Disease, Alpha 1 Antitripsin deficiency, Anxiety/depression/ deconditioning associated with borderline MO,   ABPA,  Allergy(esp in young), Aspiration (esp in elderly), Adverse effects of meds,  Active smoking or Vaping, A bunch of PE's/clot burden (a few small clots can't cause this syndrome unless there is already severe underlying pulm or vascular dz with poor reserve),  Anemia or thyroid  disorder, plus two Bs  = Bronchiectasis and Beta blocker use..and one C= CHF    Bolds are greatest concern here and  are all addressed by approp labs x  for the anxiety issue which may benefit form regular sub max ex as well   Each maintenance medication was reviewed in detail including emphasizing most importantly the difference between maintenance and prns and under what circumstances the prns are to be triggered using an  action plan format where appropriate.  Total time for H and P, chart review, counseling, reviewing hfa  device(s) , directly observing portions of ambulatory 02 saturation study/ and generating customized AVS unique to this office visit / same day charting = 35 min new pt eval

## 2023-03-01 NOTE — Patient Instructions (Addendum)
 We will walk you today for baseline   Please remember to go to the  x-ray department  for your tests - we will call you with the results when they are available .  Please remember to go to the lab department   for your tests - we will call you with the results when they are available.  Make sure you check your oxygen  saturation at your highest level of activity(NOT after you stop)  to be sure it stays over 90% and keep track of it at least once a week, more often if breathing getting worse, and let me know if losing ground. (Collect the dots to connect the dots approach)     PFTs ordered and will call when these are available.

## 2023-03-07 DIAGNOSIS — M9903 Segmental and somatic dysfunction of lumbar region: Secondary | ICD-10-CM | POA: Diagnosis not present

## 2023-03-07 DIAGNOSIS — M9902 Segmental and somatic dysfunction of thoracic region: Secondary | ICD-10-CM | POA: Diagnosis not present

## 2023-03-07 DIAGNOSIS — S233XXA Sprain of ligaments of thoracic spine, initial encounter: Secondary | ICD-10-CM | POA: Diagnosis not present

## 2023-03-07 DIAGNOSIS — S338XXA Sprain of other parts of lumbar spine and pelvis, initial encounter: Secondary | ICD-10-CM | POA: Diagnosis not present

## 2023-03-08 DIAGNOSIS — H524 Presbyopia: Secondary | ICD-10-CM | POA: Diagnosis not present

## 2023-03-14 ENCOUNTER — Ambulatory Visit
Admission: RE | Admit: 2023-03-14 | Discharge: 2023-03-14 | Disposition: A | Discharge status: Home / Self Care | Payer: Medicare HMO | Source: Ambulatory Visit | Attending: Nurse Practitioner | Admitting: Nurse Practitioner

## 2023-03-14 ENCOUNTER — Ambulatory Visit (INDEPENDENT_AMBULATORY_CARE_PROVIDER_SITE_OTHER): Payer: Medicare HMO

## 2023-03-14 VITALS — BP 100/68 | HR 84 | Temp 98.1°F | Resp 14

## 2023-03-14 DIAGNOSIS — R062 Wheezing: Secondary | ICD-10-CM | POA: Diagnosis not present

## 2023-03-14 DIAGNOSIS — Q676 Pectus excavatum: Secondary | ICD-10-CM | POA: Diagnosis not present

## 2023-03-14 DIAGNOSIS — R052 Subacute cough: Secondary | ICD-10-CM

## 2023-03-14 DIAGNOSIS — R059 Cough, unspecified: Secondary | ICD-10-CM | POA: Diagnosis not present

## 2023-03-14 DIAGNOSIS — J069 Acute upper respiratory infection, unspecified: Secondary | ICD-10-CM | POA: Diagnosis not present

## 2023-03-14 MED ORDER — PROMETHAZINE-DM 6.25-15 MG/5ML PO SYRP: 5.0000 mL | ORAL_SOLUTION | Freq: Every evening | ORAL | 0 refills | Status: DC | PRN

## 2023-03-14 MED ORDER — ALBUTEROL SULFATE HFA 108 (90 BASE) MCG/ACT IN AERS: 1.0000 | INHALATION_SPRAY | Freq: Four times a day (QID) | RESPIRATORY_TRACT | 0 refills | Status: DC | PRN

## 2023-03-14 MED ORDER — PREDNISONE 20 MG PO TABS: 40.0000 mg | ORAL_TABLET | Freq: Every day | ORAL | 0 refills | Status: AC

## 2023-03-14 NOTE — Discharge Instructions (Addendum)
You have a viral upper respiratory infection.  Symptoms should improve over the next week to 10 days.  If you develop chest pain or shortness of breath, go to the emergency room.  Chest x-ray today is negative for signs of pneumonia or acute cardiopulmonary process.  Take the oral prednisone as prescribed to help with lung inflammation and wheezing.  You can use the albuterol inhaler I sent to the pharmacy every 4-6 hours as needed to help keep your airway open and help with shortness of breath/wheezing.  Some things that can make you feel better are: - Increased rest - Increasing fluid with water/sugar free electrolytes - Acetaminophen and ibuprofen as needed for fever/pain - Salt water gargling, chloraseptic spray and throat lozenges - OTC guaifenesin (Mucinex) 600 mg twice daily for congestion - Saline sinus flushes or a neti pot - Humidifying the air -Cough syrup at nighttime as needed for dry cough

## 2023-03-14 NOTE — ED Triage Notes (Addendum)
Pt reports cough, congestion, hoarseness, chest tightness, wheezing, states she was negative for COVID. Sx's x 5 days

## 2023-03-14 NOTE — ED Provider Notes (Signed)
[facility]    CSN: [id number] Arrival date & time: 03/14/23  8295      History   Chief Complaint Chief Complaint  Patient presents with   Cough    Covid negative, coughing and wheezing - Entered by patient    HPI Jessica Pacheco is a 48 y.o. female.   Patient presents today with 5-day history of bodyaches in her upper torso, chills but she admits are not uncommon for her, congested cough with thick mucus, shortness of breath that comes and goes.  Reports she is seeing Dr. Sherene Sires, a pulmonologist for shortness of breath with exertion that has been ongoing since 2020 after she had COVID-19.  She is not prescribed any inhalers but did use her daughter's albuterol inhaler last night that helped with the wheezing and shortness of breath.  She reports her nose is feeling dry and she is having some bleeding from her nose, has hoarseness from coughing, and headache that is worse on the right side.  Also has decreased appetite and fatigue.  No stuffy or runny nose, postnasal drainage, sore throat, ear pain, abdominal pain, nausea/vomiting, or diarrhea.  Husband is sick with similar symptoms, but his symptoms are improving.  Has been taking over-the-counter Robitussin and previously prescribed Hydromet for coughing with minimal temporary improvement.    Past Medical History:  Diagnosis Date   Anxiety    Chronic right lower quadrant pain    DDD (degenerative disc disease), cervical    c6-7   Depression    Dyspnea    On exertion at times   GERD (gastroesophageal reflux disease)    WATCHES DIET   History of hypoglycemia    History of kidney stones    passed spontaneously   Hypertension    Hypothyroidism    OA (osteoarthritis)    knees , hips, feet   PONV (postoperative nausea and vomiting)    Rheumatoid arthritis(714.0) dx  2001  multiple sites   rheumotologist-  dr Molly Maduro gay w/ [facility]--- currently treated w/ oral arava and iv actemra   Shingles    SUI (stress urinary  incontinence), female     Patient Active Problem List   Diagnosis Date Noted   DOE (dyspnea on exertion) 03/01/2023   Status post total replacement of right hip 07/30/2020   Hypertensive disorder 05/14/2019   Hypothyroidism 05/14/2019   Migraine 05/14/2019   Ovarian endometriosis 05/14/2019   S/P right knee arthroscopy 02/07/18 02/07/2018   Meniscus, lateral, derangement, right    Synovitis of right knee    Arthritis of right knee    Osteoarthritis, multiple sites 01/14/2018   Rheumatoid arthritis of multiple sites with negative rheumatoid factor (HCC) 05/08/2016   Primary osteoarthritis of both knees 08/30/2015   Primary osteoarthritis of right hip 08/30/2015   Primary osteoarthritis of left foot 06/30/2014   Change in stool 07/27/2011   Contraception 01/02/2011   Rheumatoid arthritis (HCC) 02/21/1999    Past Surgical History:  Procedure Laterality Date   COLONOSCOPY WITH PROPOFOL N/A 10/27/2021   Procedure: COLONOSCOPY WITH PROPOFOL;  Surgeon: Dolores Frame, MD;  Location: [facility];  Service: Gastroenterology;  Laterality: N/A;   DILATION AND CURETTAGE OF UTERUS     KNEE ARTHROSCOPY Right 1999   KNEE ARTHROSCOPY WITH MEDIAL MENISECTOMY Right 02/07/2018   Procedure: KNEE ARTHROSCOPY WITH PARTIAL LATERAL MENISECTOMY;  Surgeon: Vickki Hearing, MD;  Location: [facility];  Service: Orthopedics;  Laterality: Right;   LAPAROSCOPY N/A 02/16/2017   Procedure: LAPAROSCOPY DIAGNOSTIC;  Surgeon: Richarda Overlie, MD;  Location: [facility];  Service: Gynecology;  Laterality: N/A;   TOTAL HIP ARTHROPLASTY Right 07/30/2020   Procedure: RIGHT TOTAL HIP ARTHROPLASTY ANTERIOR APPROACH;  Surgeon: Kathryne Hitch, MD;  Location: [facility];  Service: Orthopedics;  Laterality: Right;   WISDOM TOOTH EXTRACTION      OB History   No obstetric history on file.      Home Medications    Prior to Admission medications   Medication Sig Start Date End Date  Taking? Authorizing Provider  albuterol (VENTOLIN HFA) 108 (90 Base) MCG/ACT inhaler Inhale 1-2 puffs into the lungs every 6 (six) hours as needed for wheezing or shortness of breath. 03/14/23  Yes Cathlean Marseilles A, NP  predniSONE (DELTASONE) 20 MG tablet Take 2 tablets (40 mg total) by mouth daily with breakfast for 5 days. 03/14/23 03/19/23 Yes Valentino Nose, NP  promethazine-dextromethorphan (PROMETHAZINE-DM) 6.25-15 MG/5ML syrup Take 5 mLs by mouth at bedtime as needed for cough. Do not take with alcohol or while driving or operating heavy machinery.  May cause drowsiness. 03/14/23  Yes Cathlean Marseilles A, NP  amLODipine (NORVASC) 10 MG tablet Take 10 mg by mouth at bedtime. 01/07/18   [provider]  Ascorbic Acid (VITAMIN C PO) Take 1 tablet by mouth at bedtime.    [provider]  Ascorbic Acid (VITAMIN C) 1000 MG tablet Take 2,000 mg by mouth daily.    [provider]  cyclobenzaprine (FLEXERIL) 10 MG tablet Take 10 mg by mouth 2 (two) times daily as needed for muscle spasms.    [provider]  escitalopram (LEXAPRO) 20 MG tablet Take 20 mg by mouth in the morning. 09/17/21   [provider]  gabapentin (NEURONTIN) 300 MG capsule Take 300 mg by mouth 2 (two) times daily.    [provider]  glucosamine-chondroitin 500-400 MG tablet Take 1 tablet by mouth once.    [provider]  guaiFENesin-dextromethorphan (ROBITUSSIN DM) 100-10 MG/5ML syrup Take 5 mLs by mouth every 4 (four) hours as needed for cough.    [provider]  hydrochlorothiazide (HYDRODIURIL) 25 MG tablet Take 25 mg by mouth daily after breakfast. 04/09/18   [provider]  hydroxychloroquine (PLAQUENIL) 200 MG tablet Take 200 mg by mouth 2 (two) times daily. 02/11/20   [provider]  ibuprofen (ADVIL) 200 MG tablet Take 800 mg by mouth every 8 (eight) hours as needed (pain.).    [provider]  JUNEL FE 1/20 1-20 MG-MCG  tablet Take 1 tablet by mouth every evening. Continuous (only takes active pills) 09/17/21   [provider]  levothyroxine (SYNTHROID) 100 MCG tablet Take 100 mcg by mouth daily before breakfast. 06/24/19   [provider]  loratadine (CLARITIN) 10 MG tablet Take 10 mg by mouth daily as needed for allergies.    [provider]  LORazepam (ATIVAN) 0.5 MG tablet Take 0.5 mg by mouth daily as needed for anxiety. 10/15/21   [provider]  melatonin 3 MG TABS tablet Take 3 mg by mouth at bedtime as needed (sleep).    [provider]  metoprolol tartrate (LOPRESSOR) 25 MG tablet TAKE 1 TABLET TWICE A DAY 11/15/22   O'Neal, Ronnald Ramp, MD  ondansetron (ZOFRAN) 4 MG tablet Take 4 mg by mouth every 6 (six) hours as needed for nausea or vomiting.    [provider]  potassium chloride SA (KLOR-CON M) 20 MEQ tablet Take 20 mEq by mouth  once.    [provider]  Probiotic Product (PROBIOTIC PO) Take 1 tablet by mouth daily.    [provider]  rosuvastatin (CRESTOR) 10 MG tablet Take 1 tablet (10 mg total) by mouth daily. 05/22/22 05/17/23  O'NealRonnald Ramp, MD  Turmeric (QC TUMERIC COMPLEX) 500 MG CAPS Take 2 capsules by mouth daily.    [provider]  Upadacitinib ER (RINVOQ) 15 MG TB24 Take 15 mg by mouth in the morning. 08/31/21   [provider]  Zinc 50 MG TABS Take 1 tablet by mouth daily.    [provider]    Family History Family History  Problem Relation Age of Onset   Colon cancer Father    Colon cancer Paternal Aunt    Heart disease Maternal Grandmother    Heart disease Maternal Grandfather     Social History Social History   Tobacco Use   Smoking status: Never   Smokeless tobacco: Never  Vaping Use   Vaping status: Never Used  Substance Use Topics   Alcohol use: No   Drug use: No     Allergies   Patient has no known allergies.   Review of Systems Review of Systems Per  HPI  Physical Exam Triage Vital Signs ED Triage Vitals  Encounter Vitals Group     BP 03/14/23 1010 100/68     Systolic BP Percentile --      Diastolic BP Percentile --      Pulse Rate 03/14/23 1010 84     Resp 03/14/23 1010 14     Temp 03/14/23 1010 98.1 F (36.7 C)     Temp Source 03/14/23 1010 Oral     SpO2 03/14/23 1010 97 %     Weight --      Height --      Head Circumference --      Peak Flow --      Pain Score 03/14/23 1013 0     Pain Loc --      Pain Education --      Exclude from Growth Chart --    No data found.  Updated Vital Signs BP 100/68 (BP Location: Right Arm)   Pulse 84   Temp 98.1 F (36.7 C) (Oral)   Resp 14   SpO2 97%   Visual Acuity Right Eye Distance:   Left Eye Distance:   Bilateral Distance:    Right Eye Near:   Left Eye Near:    Bilateral Near:     Physical Exam Vitals and nursing note reviewed.  Constitutional:      General: She is not in acute distress.    Appearance: Normal appearance. She is not ill-appearing or toxic-appearing.  HENT:     Head: Normocephalic and atraumatic.     Right Ear: Tympanic membrane, ear canal and external ear normal.     Left Ear: Tympanic membrane, ear canal and external ear normal.     Nose: No congestion or rhinorrhea.     Mouth/Throat:     Mouth: Mucous membranes are moist.     Pharynx: Oropharynx is clear. Posterior oropharyngeal erythema (post nasal drainage) present. No oropharyngeal exudate.  Eyes:     General: No scleral icterus.    Extraocular Movements: Extraocular movements intact.  Cardiovascular:     Rate and Rhythm: Normal rate and regular rhythm.  Pulmonary:     Effort: Pulmonary effort is normal. No respiratory distress.     Breath sounds: Wheezing (left lung with  expiration and coughing) present. No rhonchi or rales.  Musculoskeletal:     Cervical back: Normal range of motion and neck supple.  Lymphadenopathy:     Cervical: No cervical adenopathy.  Skin:    General: Skin is  warm and dry.     Coloration: Skin is not jaundiced or pale.     Findings: No erythema or rash.  Neurological:     Mental Status: She is alert and oriented to person, place, and time.  Psychiatric:        Behavior: Behavior is cooperative.      UC Treatments / Results  Labs (all labs ordered are listed, but only abnormal results are displayed) Labs Reviewed - No data to display  EKG   Radiology DG Chest 2 View Result Date: 03/14/2023 CLINICAL DATA:  48 year old female with cough and wheezing. EXAM: CHEST - 2 VIEW COMPARISON:  Chest radiographs 03/01/2023 and earlier. FINDINGS: PA and lateral views 1049 hours. Lung volumes and mediastinal contours remain normal. Visualized tracheal air column is within normal limits. Mild pectus excavatum redemonstrated. Both lungs appear stable and clear. No pneumothorax or pleural effusion. Negative visible bowel gas. There is an upper thoracic anterior wedge compression deformity which appears unchanged from earlier this month. This is probably T4, and was normal on 2012 radiographs. IMPRESSION: 1.  No acute cardiopulmonary abnormality. 2. Evidence of a T4 compression fracture, age indeterminate and appears stable since 03/01/2023. Electronically Signed   By: Odessa Fleming M.D.   On: 03/14/2023 11:17    Procedures Procedures (including critical care time)  Medications Ordered in UC Medications - No data to display  Initial Impression / Assessment and Plan / UC Course  I have reviewed the triage vital signs and the nursing notes.  Pertinent labs & imaging results that were available during my care of the patient were reviewed by me and considered in my medical decision making (see chart for details).   Patient is well-appearing, normotensive, afebrile, not tachycardic, not tachypneic, oxygenating well on room air.    1. Subacute cough 2. Viral URI with cough 3. Wheezing Overall, vitals and exam are reassuring today Suspect viral etiology; viral  testing deferred given length of symptoms Chest x-ray is negative for pneumonia Treat wheezing with oral prednisone 40 mg daily for 5 days, start albuterol inhaler as needed for wheezing or shortness of breath Other supportive care discussed, humidifier, nasal saline rinses Return and ER precautions discussed with patient Work excuse provided  The patient was given the opportunity to ask questions.  All questions answered to their satisfaction.  The patient is in agreement to this plan.   Final Clinical Impressions(s) / UC Diagnoses   Final diagnoses:  Subacute cough  Viral URI with cough  Wheezing     Discharge Instructions      You have a viral upper respiratory infection.  Symptoms should improve over the next week to 10 days.  If you develop chest pain or shortness of breath, go to the emergency room.  Chest x-ray today is negative for signs of pneumonia or acute cardiopulmonary process.  Take the oral prednisone as prescribed to help with lung inflammation and wheezing.  You can use the albuterol inhaler I sent to the pharmacy every 4-6 hours as needed to help keep your airway open and help with shortness of breath/wheezing.  Some things that can make you feel better are: - Increased rest - Increasing fluid with water/sugar free electrolytes - Acetaminophen and ibuprofen as needed  for fever/pain - Salt water gargling, chloraseptic spray and throat lozenges - OTC guaifenesin (Mucinex) 600 mg twice daily for congestion - Saline sinus flushes or a neti pot - Humidifying the air -Cough syrup at nighttime as needed for dry cough     ED Prescriptions     Medication Sig Dispense Auth. Provider   predniSONE (DELTASONE) 20 MG tablet Take 2 tablets (40 mg total) by mouth daily with breakfast for 5 days. 10 tablet Cathlean Marseilles A, NP   promethazine-dextromethorphan (PROMETHAZINE-DM) 6.25-15 MG/5ML syrup Take 5 mLs by mouth at bedtime as needed for cough. Do not take with  alcohol or while driving or operating heavy machinery.  May cause drowsiness. 118 mL Cathlean Marseilles A, NP   albuterol (VENTOLIN HFA) 108 (90 Base) MCG/ACT inhaler Inhale 1-2 puffs into the lungs every 6 (six) hours as needed for wheezing or shortness of breath. 6.7 g Valentino Nose, NP      PDMP not reviewed this encounter.   Valentino Nose, NP 03/14/23 1147

## 2023-03-15 ENCOUNTER — Encounter: Payer: Self-pay | Admitting: Internal Medicine

## 2023-03-15 ENCOUNTER — Encounter (HOSPITAL_BASED_OUTPATIENT_CLINIC_OR_DEPARTMENT_OTHER): Payer: Medicare HMO

## 2023-03-20 NOTE — Telephone Encounter (Signed)
Dr. Sherene Sires, This is just an FYI, patient had to reschedule her PFT d/t an URI.  Thanks

## 2023-03-28 DIAGNOSIS — S233XXA Sprain of ligaments of thoracic spine, initial encounter: Secondary | ICD-10-CM | POA: Diagnosis not present

## 2023-03-28 DIAGNOSIS — M9902 Segmental and somatic dysfunction of thoracic region: Secondary | ICD-10-CM | POA: Diagnosis not present

## 2023-03-28 DIAGNOSIS — S338XXA Sprain of other parts of lumbar spine and pelvis, initial encounter: Secondary | ICD-10-CM | POA: Diagnosis not present

## 2023-03-28 DIAGNOSIS — M9903 Segmental and somatic dysfunction of lumbar region: Secondary | ICD-10-CM | POA: Diagnosis not present

## 2023-03-30 ENCOUNTER — Ambulatory Visit (HOSPITAL_BASED_OUTPATIENT_CLINIC_OR_DEPARTMENT_OTHER): Payer: Medicare HMO | Admitting: Internal Medicine

## 2023-03-30 DIAGNOSIS — R0609 Other forms of dyspnea: Secondary | ICD-10-CM | POA: Diagnosis not present

## 2023-03-30 LAB — PULMONARY FUNCTION TEST
DL/VA % pred: 135 %
DL/VA: 5.74 ml/min/mmHg/L
DLCO cor % pred: 109 %
DLCO cor: 25.74 ml/min/mmHg
DLCO unc % pred: 109 %
DLCO unc: 25.74 ml/min/mmHg
FEF 25-75 Post: 3.31 L/s
FEF 25-75 Pre: 3.17 L/s
FEF2575-%Change-Post: 4 %
FEF2575-%Pred-Post: 108 %
FEF2575-%Pred-Pre: 103 %
FEV1-%Change-Post: 0 %
FEV1-%Pred-Post: 81 %
FEV1-%Pred-Pre: 81 %
FEV1-Post: 2.58 L
FEV1-Pre: 2.57 L
FEV1FVC-%Change-Post: 1 %
FEV1FVC-%Pred-Pre: 106 %
FEV6-%Change-Post: -1 %
FEV6-%Pred-Post: 76 %
FEV6-%Pred-Pre: 77 %
FEV6-Post: 2.96 L
FEV6-Pre: 3 L
FEV6FVC-%Pred-Post: 102 %
FEV6FVC-%Pred-Pre: 102 %
FVC-%Change-Post: -1 %
FVC-%Pred-Post: 74 %
FVC-%Pred-Pre: 75 %
FVC-Post: 2.96 L
FVC-Pre: 3 L
Post FEV1/FVC ratio: 87 %
Post FEV6/FVC ratio: 100 %
Pre FEV1/FVC ratio: 86 %
Pre FEV6/FVC Ratio: 100 %
RV % pred: 74 %
RV: 1.4 L
TLC % pred: 83 %
TLC: 4.6 L

## 2023-03-30 NOTE — Patient Instructions (Signed)
 Full PFT Performed Today

## 2023-03-30 NOTE — Progress Notes (Signed)
 Full PFT Performed Today

## 2023-04-02 ENCOUNTER — Other Ambulatory Visit (HOSPITAL_COMMUNITY): Payer: Self-pay | Admitting: Obstetrics and Gynecology

## 2023-04-02 DIAGNOSIS — Z1231 Encounter for screening mammogram for malignant neoplasm of breast: Secondary | ICD-10-CM

## 2023-04-10 DIAGNOSIS — L280 Lichen simplex chronicus: Secondary | ICD-10-CM | POA: Diagnosis not present

## 2023-04-10 DIAGNOSIS — B070 Plantar wart: Secondary | ICD-10-CM | POA: Diagnosis not present

## 2023-04-10 DIAGNOSIS — M79675 Pain in left toe(s): Secondary | ICD-10-CM | POA: Diagnosis not present

## 2023-04-17 DIAGNOSIS — M9902 Segmental and somatic dysfunction of thoracic region: Secondary | ICD-10-CM | POA: Diagnosis not present

## 2023-04-17 DIAGNOSIS — S233XXA Sprain of ligaments of thoracic spine, initial encounter: Secondary | ICD-10-CM | POA: Diagnosis not present

## 2023-04-17 DIAGNOSIS — M9903 Segmental and somatic dysfunction of lumbar region: Secondary | ICD-10-CM | POA: Diagnosis not present

## 2023-04-17 DIAGNOSIS — S338XXA Sprain of other parts of lumbar spine and pelvis, initial encounter: Secondary | ICD-10-CM | POA: Diagnosis not present

## 2023-04-24 DIAGNOSIS — L280 Lichen simplex chronicus: Secondary | ICD-10-CM | POA: Diagnosis not present

## 2023-04-24 DIAGNOSIS — M79671 Pain in right foot: Secondary | ICD-10-CM | POA: Diagnosis not present

## 2023-04-24 DIAGNOSIS — B070 Plantar wart: Secondary | ICD-10-CM | POA: Diagnosis not present

## 2023-04-24 DIAGNOSIS — B079 Viral wart, unspecified: Secondary | ICD-10-CM | POA: Diagnosis not present

## 2023-05-01 DIAGNOSIS — S233XXA Sprain of ligaments of thoracic spine, initial encounter: Secondary | ICD-10-CM | POA: Diagnosis not present

## 2023-05-01 DIAGNOSIS — M9902 Segmental and somatic dysfunction of thoracic region: Secondary | ICD-10-CM | POA: Diagnosis not present

## 2023-05-01 DIAGNOSIS — M9903 Segmental and somatic dysfunction of lumbar region: Secondary | ICD-10-CM | POA: Diagnosis not present

## 2023-05-01 DIAGNOSIS — S338XXA Sprain of other parts of lumbar spine and pelvis, initial encounter: Secondary | ICD-10-CM | POA: Diagnosis not present

## 2023-05-01 NOTE — Progress Notes (Unsigned)
 Jessica Pacheco, female    DOB: 15-Oct-1975   MRN: [id number]   Brief patient profile:  7   yowf never regular smoker with seasonal allergies as adult  rx otc referred to pulmonary clinic 03/01/2023 by Dr Ouida Sills for doe  onset p 1st covid 2020 and had 2 more      Wt p last child  150  in  2009   RA  1990s  > prednisone/ methotrexate  > plaquenil  then Rinoq x around 2021   1st covid 2021 last covid summer of 2024  never hospitalized, paxlovid x 2   Atrial tach 2023  Oneal > metaprolol     History of Present Illness  03/01/2023  Pulmonary/ 1st office eval/[facility]  Chief Complaint  Patient presents with   Consult    Several episodes of SOB and dyspnea with exertion since COVID 2020.    Dyspnea:  walking at most quarter mile to neighbor's  house flat  grade sometimes has to stop half way varies sometimes  no trouble  Cough: rarely  Sleep: bed is flat / 2 pillows  SABA use: no better Rec     05/02/2023  f/u ov/[place] office/Neilani Duffee re: *** maint on ***  No chief complaint on file.   Dyspnea:  *** Cough: *** Sleeping: ***   resp cc  SABA use: *** 02: ***  Lung cancer screening: ***   No obvious day to day or daytime variability or assoc excess/ purulent sputum or mucus plugs or hemoptysis or cp or chest tightness, subjective wheeze or overt sinus or hb symptoms.    Also denies any obvious fluctuation of symptoms with weather or environmental changes or other aggravating or alleviating factors except as outlined above   No unusual exposure hx or h/o childhood pna/ asthma or knowledge of premature birth.  Current Allergies, Complete Past Medical History, Past Surgical History, Family History, and Social History were reviewed in [employer] record.  ROS  The following are not active complaints unless bolded Hoarseness, sore throat, dysphagia, dental problems, itching, sneezing,  nasal congestion or discharge of excess mucus or purulent secretions, ear  ache,   fever, chills, sweats, unintended wt loss or wt gain, classically pleuritic or exertional cp,  orthopnea pnd or arm/hand swelling  or leg swelling, presyncope, palpitations, abdominal pain, anorexia, nausea, vomiting, diarrhea  or change in bowel habits or change in bladder habits, change in stools or change in urine, dysuria, hematuria,  rash, arthralgias, visual complaints, headache, numbness, weakness or ataxia or problems with walking or coordination,  change in mood or  memory.        No outpatient medications have been marked as taking for the 05/02/23 encounter (Appointment) with Nyoka Cowden, MD.              Past Medical History:  Diagnosis Date   Anxiety    Chronic right lower quadrant pain    DDD (degenerative disc disease), cervical    c6-7   Depression    Dyspnea    On exertion at times   GERD (gastroesophageal reflux disease)    WATCHES DIET   History of hypoglycemia    History of kidney stones    passed spontaneously   Hypertension    Hypothyroidism    OA (osteoarthritis)    knees , hips, feet   PONV (postoperative nausea and vomiting)    Rheumatoid arthritis(714.0) dx  2001  multiple sites   rheumotologist-  dr Molly Maduro  gay w/ [facility]--- currently treated w/ oral arava and iv actemra   Shingles    SUI (stress urinary incontinence), female       Objective:    Wt Readings from Last 3 Encounters:  03/30/23 241 lb 6.4 oz (109.5 kg)  03/01/23 245 lb 6.4 oz (111.3 kg)  07/27/22 240 lb (108.9 kg)      Vital signs reviewed  05/02/2023  - Note at rest 02 sats  ***% on ***   General appearance:    ***       CXR PA and Lateral:   03/01/2023 :    I personally reviewed images and impression is as follows:     No acute dz - specifically nothing to suggest ILD / RA or otherwise      Assessment

## 2023-05-02 ENCOUNTER — Encounter: Payer: Self-pay | Admitting: Internal Medicine

## 2023-05-02 ENCOUNTER — Ambulatory Visit: Payer: Medicare HMO | Admitting: Internal Medicine

## 2023-05-02 VITALS — BP 116/79 | HR 80 | Ht 67.0 in | Wt 244.8 lb

## 2023-05-02 DIAGNOSIS — R0609 Other forms of dyspnea: Secondary | ICD-10-CM

## 2023-05-02 DIAGNOSIS — R059 Cough, unspecified: Secondary | ICD-10-CM

## 2023-05-02 DIAGNOSIS — Z7722 Contact with and (suspected) exposure to environmental tobacco smoke (acute) (chronic): Secondary | ICD-10-CM | POA: Diagnosis not present

## 2023-05-02 NOTE — Patient Instructions (Addendum)
 Make sure you check your oxygen saturation at your highest level of activity (NOT after you stop)  to be sure it stays over 90% and keep track of it at least once a week, more often if breathing getting worse, and let me know if losing ground. (Collect the dots to connect the dots approach)    Please remember to go to the lab department   for your tests - we will call you with the results when they are available.     Follow up here is as needed

## 2023-05-03 NOTE — Assessment & Plan Note (Signed)
 Onset Dec 2020 with covid and never back to baseline - 03/01/2023   Walked on RA  x  3  lap(s) =  approx 750  ft  @ n pace, stopped due to end of study  with lowest 02 sats 99%  s sob   -  PFT 03/30/23 ERV 11% @ wt 241 with truncated  insp portion to f/v but no true plateau - Allergy screen 05/02/2023 >  Eos 0. /  IgE    Reviewed findings on pfts this is mostly a wt/ conditioning issue  though she does describe some upper airway cc's that may indeed be allergy, I don't she has asthma so rec  >>> allergy screen today  >>> monitor sats at peak ex to detect early ILD from RA   F/u is prn here         Each maintenance medication was reviewed in detail including emphasizing most importantly the difference between maintenance and prns and under what circumstances the prns are to be triggered using an action plan format where appropriate.  Total time for H and P, chart review, counseling, reviewing hfa device(s) and generating customized AVS unique to this office visit / same day charting = 32 min final summary f/u ov

## 2023-05-04 ENCOUNTER — Encounter (HOSPITAL_COMMUNITY): Payer: Self-pay

## 2023-05-04 ENCOUNTER — Ambulatory Visit (HOSPITAL_COMMUNITY)
Admission: RE | Admit: 2023-05-04 | Discharge: 2023-05-04 | Disposition: A | Discharge status: Home / Self Care | Payer: Medicare HMO | Source: Ambulatory Visit | Attending: Obstetrics and Gynecology | Admitting: Obstetrics and Gynecology

## 2023-05-04 DIAGNOSIS — Z1231 Encounter for screening mammogram for malignant neoplasm of breast: Secondary | ICD-10-CM | POA: Insufficient documentation

## 2023-05-07 DIAGNOSIS — I10 Essential (primary) hypertension: Secondary | ICD-10-CM | POA: Diagnosis not present

## 2023-05-07 DIAGNOSIS — B0223 Postherpetic polyneuropathy: Secondary | ICD-10-CM | POA: Diagnosis not present

## 2023-05-07 DIAGNOSIS — E039 Hypothyroidism, unspecified: Secondary | ICD-10-CM | POA: Diagnosis not present

## 2023-05-07 LAB — CBC WITH DIFFERENTIAL/PLATELET
Basophils Absolute: 0 10*3/uL (ref 0.0–0.2)
Basos: 0 %
EOS (ABSOLUTE): 0.1 10*3/uL (ref 0.0–0.4)
Eos: 1 %
Hematocrit: 41.6 % (ref 34.0–46.6)
Hemoglobin: 13.9 g/dL (ref 11.1–15.9)
Immature Grans (Abs): 0 10*3/uL (ref 0.0–0.1)
Immature Granulocytes: 0 %
Lymphocytes Absolute: 1.5 10*3/uL (ref 0.7–3.1)
Lymphs: 20 %
MCH: 32.3 pg (ref 26.6–33.0)
MCHC: 33.4 g/dL (ref 31.5–35.7)
MCV: 97 fL (ref 79–97)
Monocytes Absolute: 0.6 10*3/uL (ref 0.1–0.9)
Monocytes: 8 %
Neutrophils Absolute: 5.2 10*3/uL (ref 1.4–7.0)
Neutrophils: 71 %
Platelets: 324 10*3/uL (ref 150–450)
RBC: 4.31 x10E6/uL (ref 3.77–5.28)
RDW: 12.5 % (ref 11.7–15.4)
WBC: 7.4 10*3/uL (ref 3.4–10.8)

## 2023-05-07 LAB — IGE: IgE (Immunoglobulin E), Serum: 5 [IU]/mL — ABNORMAL LOW (ref 6–495)

## 2023-05-15 DIAGNOSIS — M810 Age-related osteoporosis without current pathological fracture: Secondary | ICD-10-CM | POA: Diagnosis not present

## 2023-05-15 DIAGNOSIS — M0609 Rheumatoid arthritis without rheumatoid factor, multiple sites: Secondary | ICD-10-CM | POA: Diagnosis not present

## 2023-05-15 DIAGNOSIS — Z79899 Other long term (current) drug therapy: Secondary | ICD-10-CM | POA: Diagnosis not present

## 2023-05-21 DIAGNOSIS — Z6839 Body mass index (BMI) 39.0-39.9, adult: Secondary | ICD-10-CM | POA: Diagnosis not present

## 2023-05-21 DIAGNOSIS — Z309 Encounter for contraceptive management, unspecified: Secondary | ICD-10-CM | POA: Diagnosis not present

## 2023-05-21 DIAGNOSIS — Z01419 Encounter for gynecological examination (general) (routine) without abnormal findings: Secondary | ICD-10-CM | POA: Diagnosis not present

## 2023-05-23 ENCOUNTER — Other Ambulatory Visit (HOSPITAL_COMMUNITY): Payer: Self-pay | Admitting: "Specialist/Technologist

## 2023-05-23 DIAGNOSIS — M810 Age-related osteoporosis without current pathological fracture: Secondary | ICD-10-CM

## 2023-05-28 ENCOUNTER — Encounter (HOSPITAL_COMMUNITY): Payer: Self-pay | Admitting: Gerontology

## 2023-05-29 DIAGNOSIS — M9903 Segmental and somatic dysfunction of lumbar region: Secondary | ICD-10-CM | POA: Diagnosis not present

## 2023-05-29 DIAGNOSIS — S338XXA Sprain of other parts of lumbar spine and pelvis, initial encounter: Secondary | ICD-10-CM | POA: Diagnosis not present

## 2023-05-29 DIAGNOSIS — M9902 Segmental and somatic dysfunction of thoracic region: Secondary | ICD-10-CM | POA: Diagnosis not present

## 2023-05-29 DIAGNOSIS — S233XXA Sprain of ligaments of thoracic spine, initial encounter: Secondary | ICD-10-CM | POA: Diagnosis not present

## 2023-05-31 NOTE — Progress Notes (Unsigned)
 Cardiology Office Note:  .   Date:  06/01/2023  ID:  [name], DOB 02-14-1976, MRN [id number] PCP: Carylon Perches, MD  [facility] Providers Cardiologist:  None    History of Present Illness: .    Chief Complaint  Patient presents with   Follow-up    Jessica Pacheco is a 48 y.o. female with history of A tach who presents for follow-up.   History of Present Illness   Jessica Pacheco is a 48 year old female with atrial tachycardia who presents for follow up.   She experiences episodes of palpitations three to four times a month, with each episode lasting a couple of minutes. During these episodes, her heart rate can increase to about 120 beats per minute, even when at rest. She uses a pulse oximeter to monitor her heart rate, and her husband confirms that her heart rate remains regular despite the increase in speed. She is currently taking metoprolol tartrate for her condition. Her history of atrial tachycardia was first noted during a colonoscopy. Previous diagnostic studies, including a coronary CTA and echocardiogram, have shown normal results, and a heart monitor did not reveal any arrhythmias. An EKG performed today also showed normal sinus rhythm.  She mentions having shortness of breath on exertion, but pulmonary function tests have returned excellent results, with no lung damage from COVID-19, rheumatoid arthritis, or medications. She has joined a gym and is working on increasing her physical activity, although she had to cancel a session due to her dog's illness. She has lost about five pounds since starting her exercise regimen.  She has reduced her caffeine intake from three to five drinks a day to one or two and is attempting to increase her water intake, although she finds it challenging.  She does not have a diagnosis of sleep apnea, but her husband reports that she snores, and she often wakes up with a dry mouth, which her rheumatologist attributes to rheumatoid  arthritis.  She is on rosuvastatin 10 mg daily for cholesterol management, with her LDL levels reported as 72.          Problem List Rheumatoid arthritis  HTN Atrial tachycardia  -during colonoscopy  -no recurrence on zio 11/2021 -normal CCTA, CAC=0 4. HLD -T chol 152, HDL 57, LDL 72, TG 131    ROS: All other ROS reviewed and negative. Pertinent positives noted in the HPI.     Studies Reviewed: Marland Kitchen   EKG Interpretation Date/Time:  Friday June 01 2023 09:51:58 EDT Ventricular Rate:  68 PR Interval:  156 QRS Duration:  88 QT Interval:  464 QTC Calculation: 493 R Axis:   54  Text Interpretation: Normal sinus rhythm Confirmed by Lennie Odor [phone]) on 06/01/2023 10:01:03 AM   CCTA 05/20/2022 IMPRESSION: 1. Coronary calcium score of 0.   2. Normal coronary origin with right dominance.   3. Nonobstructive CAD, with noncalcified plaque in proximal LAD causing minimal (0-24%) stenosis. Physical Exam:   VS:  BP 114/72 (BP Location: Left Arm, Patient Position: Sitting)   Pulse 68   Ht 5\' 6"  (1.676 m)   Wt 243 lb 3.2 oz (110.3 kg)   SpO2 99%   BMI 39.25 kg/m    Wt Readings from Last 3 Encounters:  06/01/23 243 lb 3.2 oz (110.3 kg)  05/02/23 244 lb 12.8 oz (111 kg)  03/30/23 241 lb 6.4 oz (109.5 kg)    GEN: Well nourished, well developed in no acute distress NECK: No JVD; No  carotid bruits CARDIAC: RRR, no murmurs, rubs, gallops RESPIRATORY:  Clear to auscultation without rales, wheezing or rhonchi  ABDOMEN: Soft, non-tender, non-distended EXTREMITIES:  No edema; No deformity  ASSESSMENT AND PLAN: .   Assessment and Plan    Atrial Tachycardia Atrial tachycardia with palpitations up to 120 bpm, normal EKG, coronary CTA, and echocardiogram. Discussed switching to metoprolol succinate for longer action. - Discontinue metoprolol tartrate. - Initiate metoprolol succinate 25 mg daily. - Encourage regular exercise and weight loss. - Advise moderation of caffeine  intake. - Ensure adequate hydration with 60-80 ounces of water daily.  Hyperlipidemia LDL cholesterol at goal with rosuvastatin 10 mg daily. No aggressive lipid management needed. - Continue rosuvastatin 10 mg daily.              Follow-up: Return in about 1 year (around 05/31/2024).  Signed, Lenna Gilford. Flora Lipps, MD, [facility]  [facility]  [address], [address] [place], [place] [address] [phone]  10:27 AM

## 2023-06-01 ENCOUNTER — Ambulatory Visit: Payer: Medicare HMO | Admitting: Cardiovascular Disease

## 2023-06-01 ENCOUNTER — Ambulatory Visit: Payer: Medicare HMO | Attending: Cardiovascular Disease | Admitting: Cardiovascular Disease

## 2023-06-01 ENCOUNTER — Encounter: Payer: Self-pay | Admitting: Cardiovascular Disease

## 2023-06-01 VITALS — BP 114/72 | HR 68 | Ht 66.0 in | Wt 243.2 lb

## 2023-06-01 DIAGNOSIS — I4719 Other supraventricular tachycardia: Secondary | ICD-10-CM

## 2023-06-01 DIAGNOSIS — E782 Mixed hyperlipidemia: Secondary | ICD-10-CM | POA: Diagnosis not present

## 2023-06-01 MED ORDER — METOPROLOL SUCCINATE ER 100 MG PO TB24: 100.0000 mg | ORAL_TABLET | Freq: Every day | ORAL | 3 refills | Status: AC

## 2023-06-01 NOTE — Patient Instructions (Addendum)
 Medication Instructions:  START METOPROLOL SUCCINATE 100 MG BY MOUTH DAILY.    *If you need a refill on your cardiac medications before your next appointment, please call your pharmacy*   Lab Work: NONE    If you have labs (blood work) drawn today and your tests are completely normal, you will receive your results only by: MyChart Message (if you have MyChart) OR A paper copy in the mail If you have any lab test that is abnormal or we need to change your treatment, we will call you to review the results.   Testing/Procedures: NONE    Follow-Up: At [facility], you and your health needs are our priority.  As part of our continuing mission to provide you with exceptional heart care, we have created designated Provider Care Teams.  These Care Teams include your primary Cardiologist (physician) and Advanced Practice Providers (APPs -  Physician Assistants and Nurse Practitioners) who all work together to provide you with the care you need, when you need it.  We recommend signing up for the patient portal called "MyChart".  Sign up information is provided on this After Visit Summary.  MyChart is used to connect with patients for Virtual Visits (Telemedicine).  Patients are able to view lab/test results, encounter notes, upcoming appointments, etc.  Non-urgent messages can be sent to your provider as well.   To learn more about what you can do with MyChart, go to [email or web address].    Your next appointment:   1 year(s)  The format for your next appointment:   In Person  Provider:   Edd Fabian, FNP, Micah Flesher, PA-C, Marjie Skiff, PA-C, Robet Leu, PA-C, Azalee Course, PA-C, Bernadene Person, NP, or Reather Littler, NP         Other Instructions

## 2023-06-03 ENCOUNTER — Other Ambulatory Visit: Payer: Self-pay | Admitting: Cardiovascular Disease

## 2023-06-04 ENCOUNTER — Ambulatory Visit (HOSPITAL_COMMUNITY)
Admission: RE | Admit: 2023-06-04 | Discharge: 2023-06-04 | Disposition: A | Discharge status: Home / Self Care | Source: Ambulatory Visit | Attending: "Specialist/Technologist | Admitting: "Specialist/Technologist

## 2023-06-04 DIAGNOSIS — M810 Age-related osteoporosis without current pathological fracture: Secondary | ICD-10-CM | POA: Insufficient documentation

## 2023-06-05 MED ORDER — ROSUVASTATIN CALCIUM 10 MG PO TABS: 10.0000 mg | ORAL_TABLET | Freq: Every day | ORAL | 3 refills | Status: AC

## 2023-06-19 DIAGNOSIS — M9903 Segmental and somatic dysfunction of lumbar region: Secondary | ICD-10-CM | POA: Diagnosis not present

## 2023-06-19 DIAGNOSIS — M9902 Segmental and somatic dysfunction of thoracic region: Secondary | ICD-10-CM | POA: Diagnosis not present

## 2023-06-19 DIAGNOSIS — S233XXA Sprain of ligaments of thoracic spine, initial encounter: Secondary | ICD-10-CM | POA: Diagnosis not present

## 2023-06-19 DIAGNOSIS — S338XXA Sprain of other parts of lumbar spine and pelvis, initial encounter: Secondary | ICD-10-CM | POA: Diagnosis not present

## 2023-07-05 DIAGNOSIS — H52223 Regular astigmatism, bilateral: Secondary | ICD-10-CM | POA: Diagnosis not present

## 2023-07-05 DIAGNOSIS — H5203 Hypermetropia, bilateral: Secondary | ICD-10-CM | POA: Diagnosis not present

## 2023-07-10 DIAGNOSIS — S338XXA Sprain of other parts of lumbar spine and pelvis, initial encounter: Secondary | ICD-10-CM | POA: Diagnosis not present

## 2023-07-10 DIAGNOSIS — M9903 Segmental and somatic dysfunction of lumbar region: Secondary | ICD-10-CM | POA: Diagnosis not present

## 2023-07-10 DIAGNOSIS — S233XXA Sprain of ligaments of thoracic spine, initial encounter: Secondary | ICD-10-CM | POA: Diagnosis not present

## 2023-07-10 DIAGNOSIS — M9902 Segmental and somatic dysfunction of thoracic region: Secondary | ICD-10-CM | POA: Diagnosis not present

## 2023-07-23 ENCOUNTER — Encounter: Payer: Self-pay | Admitting: Orthopedic Surgery

## 2023-07-24 DIAGNOSIS — S233XXA Sprain of ligaments of thoracic spine, initial encounter: Secondary | ICD-10-CM | POA: Diagnosis not present

## 2023-07-24 DIAGNOSIS — S338XXA Sprain of other parts of lumbar spine and pelvis, initial encounter: Secondary | ICD-10-CM | POA: Diagnosis not present

## 2023-07-24 DIAGNOSIS — M9902 Segmental and somatic dysfunction of thoracic region: Secondary | ICD-10-CM | POA: Diagnosis not present

## 2023-07-24 DIAGNOSIS — M9903 Segmental and somatic dysfunction of lumbar region: Secondary | ICD-10-CM | POA: Diagnosis not present

## 2023-07-26 ENCOUNTER — Ambulatory Visit: Admitting: Orthopedic Surgery

## 2023-07-27 ENCOUNTER — Ambulatory Visit: Payer: Medicare HMO | Admitting: Orthopedic Surgery

## 2023-08-02 ENCOUNTER — Encounter: Payer: Self-pay | Admitting: Orthopedic Surgery

## 2023-08-02 ENCOUNTER — Other Ambulatory Visit (INDEPENDENT_AMBULATORY_CARE_PROVIDER_SITE_OTHER): Payer: Self-pay

## 2023-08-02 ENCOUNTER — Ambulatory Visit: Admitting: Orthopedic Surgery

## 2023-08-02 VITALS — BP 114/72 | Ht 66.0 in | Wt 243.0 lb

## 2023-08-02 DIAGNOSIS — S22040A Wedge compression fracture of fourth thoracic vertebra, initial encounter for closed fracture: Secondary | ICD-10-CM

## 2023-08-02 DIAGNOSIS — G8929 Other chronic pain: Secondary | ICD-10-CM | POA: Diagnosis not present

## 2023-08-02 DIAGNOSIS — M1711 Unilateral primary osteoarthritis, right knee: Secondary | ICD-10-CM | POA: Diagnosis not present

## 2023-08-02 DIAGNOSIS — M25561 Pain in right knee: Secondary | ICD-10-CM | POA: Diagnosis not present

## 2023-08-02 MED ORDER — METHYLPREDNISOLONE ACETATE 40 MG/ML IJ SUSP
40.0000 mg | Freq: Once | INTRAMUSCULAR | Status: AC
  Administered 2023-08-02: 40 mg via INTRA_ARTICULAR

## 2023-08-02 NOTE — Progress Notes (Signed)
  Intake history:  BP 114/72 Comment: 06/01/23 cardiology visit  Ht 5' 6 (1.676 m)   Wt 243 lb (110.2 kg)   BMI 39.22 kg/m  Body mass index is 39.22 kg/m.    WHAT ARE WE SEEING YOU FOR TODAY?   back - upper thoracic  Radiation?: no .   Loss of bowel/urine control?  no  How long has this bothered you? (DOI?DOS?WS?)  on November fell/ later showed T4 fracture on xray Also has increased knee pain   Anticoag.  No  Diabetes No  Heart disease Yes  Hypertension Yes  SMOKING HX No  Kidney disease No  Any ALLERGIES ______No Known Allergies ________________________________________   Treatment:  Have you taken:  Tylenol  Yes  Advil  No  Had PT Yes has had dry needling also that helped   Had injection No/ has had lumbar injections in the past from Dr Daisey Dryer   Other  _________________________

## 2023-08-02 NOTE — Addendum Note (Signed)
 Addended byArla Lab on: 08/02/2023 11:27 AM   Modules accepted: Orders

## 2023-08-02 NOTE — Progress Notes (Signed)
 Chief Complaint  Patient presents with   Knee Pain    Right / has had pain more in past couple months    Back Pain    Has had pain since November, shows T4 fracture on a chest xray wants thoracic spine xray today     BP 114/72 Comment: 06/01/23 cardiology visit  Ht 5' 6 (1.676 m)   Wt 243 lb (110.2 kg)   BMI 39.46 kg/m   48 year old female diagnosed with rheumatoid arthritis but has an osteoarthritis picture in the right knee.  Complains of increasing pain and catching in the right knee and has good and bad days  She also had a thoracic #4 fracture which was found during the evaluation of persistent cough.  She had a bone density test that was normal.  She still has some mild thoracic back pain  Examination of the right knee no effusion tenderness around the parapatellar region medial compartment range of motion is normal.  DG Thoracic Spine 2 View Result Date: 08/02/2023 Thoracic spine T4 fracture follow-up x-rays noted January 9 and 22 of this year T4 fracture.  Follow-up shows persistent T4 fracture.  Best seen on the AP view hard to see on the lateral view of these films but easily seen on the lateral view of previous films Patient had bone density done Impression T4 fracture stable   DG Knee AP/LAT W/Sunrise Right Result Date: 08/02/2023 Imaging study right knee Compared to x-ray 1 year ago we still see the lateral osteophyte the lateral compartment chondrosis alignment still shows normal valgus Axial view shows medial and lateral osteophytes around the patellofemoral joint on the femoral side with normal patellar tracking Arthritis right knee with underlying history of rheumatoid arthritis no change from last x-ray last year    Assessment and plan  Problem #1  Thoracic fracture T4 apparently had physical therapy still having pain Recommend orthospine surgery or neurosurgery follow-up Should check with primary care doctor to ensure that all cancer screenings have been  done   Problem #2 Knee pain history of rheumatoid arthritis status post knee arthroscopy right knee  Procedure note right knee injection   verbal consent was obtained to inject right knee joint  Timeout was completed to confirm the site of injection  The medications used were depomedrol 40 mg and 1% lidocaine  3 cc Anesthesia was provided by ethyl chloride and the skin was prepped with alcohol.  After cleaning the skin with alcohol a 20-gauge needle was used to inject the right knee joint. There were no complications. A sterile bandage was applied.    1 year follow-up x-ray right knee and patient says she is starting to have some left hip issues she has had a right total hip by Dr. Lucienne Ryder in the past

## 2023-08-07 DIAGNOSIS — M9903 Segmental and somatic dysfunction of lumbar region: Secondary | ICD-10-CM | POA: Diagnosis not present

## 2023-08-07 DIAGNOSIS — S338XXA Sprain of other parts of lumbar spine and pelvis, initial encounter: Secondary | ICD-10-CM | POA: Diagnosis not present

## 2023-08-07 DIAGNOSIS — S233XXA Sprain of ligaments of thoracic spine, initial encounter: Secondary | ICD-10-CM | POA: Diagnosis not present

## 2023-08-07 DIAGNOSIS — M9902 Segmental and somatic dysfunction of thoracic region: Secondary | ICD-10-CM | POA: Diagnosis not present

## 2023-08-21 DIAGNOSIS — S233XXA Sprain of ligaments of thoracic spine, initial encounter: Secondary | ICD-10-CM | POA: Diagnosis not present

## 2023-08-21 DIAGNOSIS — M9903 Segmental and somatic dysfunction of lumbar region: Secondary | ICD-10-CM | POA: Diagnosis not present

## 2023-08-21 DIAGNOSIS — M9902 Segmental and somatic dysfunction of thoracic region: Secondary | ICD-10-CM | POA: Diagnosis not present

## 2023-08-21 DIAGNOSIS — S338XXA Sprain of other parts of lumbar spine and pelvis, initial encounter: Secondary | ICD-10-CM | POA: Diagnosis not present

## 2023-08-31 ENCOUNTER — Encounter: Payer: Self-pay | Admitting: Physical Medicine and Rehabilitation

## 2023-09-03 ENCOUNTER — Other Ambulatory Visit: Payer: Self-pay | Admitting: Physical Medicine and Rehabilitation

## 2023-09-03 DIAGNOSIS — M5416 Radiculopathy, lumbar region: Secondary | ICD-10-CM

## 2023-09-04 DIAGNOSIS — I10 Essential (primary) hypertension: Secondary | ICD-10-CM | POA: Diagnosis not present

## 2023-09-04 DIAGNOSIS — S233XXA Sprain of ligaments of thoracic spine, initial encounter: Secondary | ICD-10-CM | POA: Diagnosis not present

## 2023-09-04 DIAGNOSIS — F410 Panic disorder [episodic paroxysmal anxiety] without agoraphobia: Secondary | ICD-10-CM | POA: Diagnosis not present

## 2023-09-04 DIAGNOSIS — M0500 Felty's syndrome, unspecified site: Secondary | ICD-10-CM | POA: Diagnosis not present

## 2023-09-04 DIAGNOSIS — M9903 Segmental and somatic dysfunction of lumbar region: Secondary | ICD-10-CM | POA: Diagnosis not present

## 2023-09-04 DIAGNOSIS — M9902 Segmental and somatic dysfunction of thoracic region: Secondary | ICD-10-CM | POA: Diagnosis not present

## 2023-09-04 DIAGNOSIS — I471 Supraventricular tachycardia, unspecified: Secondary | ICD-10-CM | POA: Diagnosis not present

## 2023-09-04 DIAGNOSIS — S338XXA Sprain of other parts of lumbar spine and pelvis, initial encounter: Secondary | ICD-10-CM | POA: Diagnosis not present

## 2023-09-10 DIAGNOSIS — Z79899 Other long term (current) drug therapy: Secondary | ICD-10-CM | POA: Diagnosis not present

## 2023-09-14 DIAGNOSIS — M0609 Rheumatoid arthritis without rheumatoid factor, multiple sites: Secondary | ICD-10-CM | POA: Diagnosis not present

## 2023-09-14 DIAGNOSIS — Z79899 Other long term (current) drug therapy: Secondary | ICD-10-CM | POA: Diagnosis not present

## 2023-09-14 DIAGNOSIS — E559 Vitamin D deficiency, unspecified: Secondary | ICD-10-CM | POA: Diagnosis not present

## 2023-09-18 DIAGNOSIS — M9903 Segmental and somatic dysfunction of lumbar region: Secondary | ICD-10-CM | POA: Diagnosis not present

## 2023-09-18 DIAGNOSIS — M9902 Segmental and somatic dysfunction of thoracic region: Secondary | ICD-10-CM | POA: Diagnosis not present

## 2023-09-18 DIAGNOSIS — S233XXA Sprain of ligaments of thoracic spine, initial encounter: Secondary | ICD-10-CM | POA: Diagnosis not present

## 2023-09-18 DIAGNOSIS — S338XXA Sprain of other parts of lumbar spine and pelvis, initial encounter: Secondary | ICD-10-CM | POA: Diagnosis not present

## 2023-09-27 ENCOUNTER — Ambulatory Visit: Admitting: Physical Medicine and Rehabilitation

## 2023-09-27 ENCOUNTER — Other Ambulatory Visit: Payer: Self-pay

## 2023-09-27 VITALS — BP 129/82 | HR 74

## 2023-09-27 DIAGNOSIS — M5416 Radiculopathy, lumbar region: Secondary | ICD-10-CM

## 2023-09-27 MED ORDER — METHYLPREDNISOLONE ACETATE 40 MG/ML IJ SUSP
40.0000 mg | Freq: Once | INTRAMUSCULAR | Status: AC
Start: 2023-09-27 — End: 2023-09-27
  Administered 2023-09-27: 40 mg

## 2023-09-27 NOTE — Progress Notes (Signed)
 Pain Scale   Average Pain 8 Patient advising her chronic lower back pain increases when walking and it radiates to the right side and her leg        +Driver, -BT, -Dye Allergies.

## 2023-09-27 NOTE — Patient Instructions (Signed)

## 2023-10-02 DIAGNOSIS — S233XXA Sprain of ligaments of thoracic spine, initial encounter: Secondary | ICD-10-CM | POA: Diagnosis not present

## 2023-10-02 DIAGNOSIS — S338XXA Sprain of other parts of lumbar spine and pelvis, initial encounter: Secondary | ICD-10-CM | POA: Diagnosis not present

## 2023-10-02 DIAGNOSIS — M9903 Segmental and somatic dysfunction of lumbar region: Secondary | ICD-10-CM | POA: Diagnosis not present

## 2023-10-02 DIAGNOSIS — M9902 Segmental and somatic dysfunction of thoracic region: Secondary | ICD-10-CM | POA: Diagnosis not present

## 2023-10-07 NOTE — Progress Notes (Signed)
 Jessica Pacheco - 48 y.o. female MRN [id number]  Date of birth: 1976/02/15  Office Visit Note: Visit Date: 09/27/2023 PCP: Sheryle Carwin, MD Referred by: Sheryle Carwin, MD  Subjective: Chief Complaint  Patient presents with   Lower Back - Pain   HPI:  Jessica Pacheco is a 48 y.o. female who comes in today at the request of Dr. Taft Minerva for planned Right L3-4 Lumbar Interlaminar epidural steroid injection with fluoroscopic guidance.  The patient has failed conservative care including home exercise, medications, time and activity modification.  This injection will be diagnostic and hopefully therapeutic.  Please see requesting physician notes for further details and justification.   ROS Otherwise per HPI.  Assessment & Plan: Visit Diagnoses:    ICD-10-CM   1. Lumbar radiculopathy  M54.16 XR C-ARM NO REPORT    Epidural Steroid injection    methylPREDNISolone  acetate (DEPO-MEDROL ) injection 40 mg      Plan: No additional findings.   Meds & Orders:  Meds ordered this encounter  Medications   methylPREDNISolone  acetate (DEPO-MEDROL ) injection 40 mg    Orders Placed This Encounter  Procedures   XR C-ARM NO REPORT   Epidural Steroid injection    Follow-up: Return for visit to requesting provider as needed.   Procedures: No procedures performed  Lumbar Epidural Steroid Injection - Interlaminar Approach with Fluoroscopic Guidance  Patient: Jessica Pacheco      Date of Birth: 1975-11-19 MRN: [id number] PCP: Sheryle Carwin, MD      Visit Date: 09/27/2023   Universal Protocol:     Consent Given By: the patient  Position: PRONE  Additional Comments: Vital signs were monitored before and after the procedure. Patient was prepped and draped in the usual sterile fashion. The correct patient, procedure, and site was verified.   Injection Procedure Details:   Procedure diagnoses: Lumbar radiculopathy [M54.16]   Meds Administered:  Meds ordered this encounter  Medications    methylPREDNISolone  acetate (DEPO-MEDROL ) injection 40 mg     Laterality: Right  Location/Site:  L3-4  Needle: 3.5 in., 20 ga. Tuohy  Needle Placement: Paramedian epidural  Findings:   -Comments: Excellent flow of contrast into the epidural space.  Procedure Details: Using a paramedian approach from the side mentioned above, the region overlying the inferior lamina was localized under fluoroscopic visualization and the soft tissues overlying this structure were infiltrated with 4 ml. of 1% Lidocaine  without Epinephrine . The Tuohy needle was inserted into the epidural space using a paramedian approach.   The epidural space was localized using loss of resistance along with counter oblique bi-planar fluoroscopic views.  After negative aspirate for air, blood, and CSF, a 2 ml. volume of Isovue -250 was injected into the epidural space and the flow of contrast was observed. Radiographs were obtained for documentation purposes.    The injectate was administered into the level noted above.   Additional Comments:  The patient tolerated the procedure well Dressing: 2 x 2 sterile gauze and Band-Aid    Post-procedure details: Patient was observed during the procedure. Post-procedure instructions were reviewed.  Patient left the clinic in stable condition.   Clinical History: MRI LUMBAR SPINE WITHOUT CONTRAST   TECHNIQUE: Multiplanar, multisequence MR imaging of the lumbar spine was performed. No intravenous contrast was administered.   COMPARISON:  Lumbar spine x-rays dated December 12, 2021.   FINDINGS: Segmentation:  Standard.   Alignment: 6 mm anterolisthesis at L3-L4 due to bilateral L3 pars defects.   Vertebrae: No fracture, evidence of  discitis, or bone lesion. Asymmetric right-sided degenerative endplate marrow edema at L3-L4.   Conus medullaris and cauda equina: Conus extends to the T12-L1 level. Conus and cauda equina appear normal.   Paraspinal and other soft  tissues: Negative.   Disc levels:   T11-T12 to L2-L3: Negative.   L3-L4: Disc uncovering and mild disc bulging eccentric to the right. Mild right lateral recess and bilateral neuroforaminal stenosis. No spinal canal stenosis.   L4-L5:  Negative.   L5-S1: Negative disc. Mild bilateral facet arthropathy. No stenosis.   IMPRESSION: 1. Grade 1 anterolisthesis at L3-L4 due to bilateral L3 pars defects. Mild right lateral recess and bilateral neuroforaminal stenosis at this level.     Electronically Signed   By: Elsie ONEIDA Shoulder M.D.   On: 12/23/2021 14:21     Objective:  VS:  HT:    WT:   BMI:     BP:129/82  HR:74bpm  TEMP: ( )  RESP:  Physical Exam Vitals and nursing note reviewed.  Constitutional:      General: She is not in acute distress.    Appearance: Normal appearance. She is obese. She is not ill-appearing.  HENT:     Head: Normocephalic and atraumatic.     Right Ear: External ear normal.     Left Ear: External ear normal.  Eyes:     Extraocular Movements: Extraocular movements intact.  Cardiovascular:     Rate and Rhythm: Normal rate.     Pulses: Normal pulses.  Pulmonary:     Effort: Pulmonary effort is normal. No respiratory distress.  Abdominal:     General: There is no distension.     Palpations: Abdomen is soft.  Musculoskeletal:        General: Tenderness present.     Cervical back: Neck supple.     Right lower leg: No edema.     Left lower leg: No edema.     Comments: Patient has good distal strength with no pain over the greater trochanters.  No clonus or focal weakness.  Skin:    Findings: No erythema, lesion or rash.  Neurological:     General: No focal deficit present.     Mental Status: She is alert and oriented to person, place, and time.     Sensory: No sensory deficit.     Motor: No weakness or abnormal muscle tone.     Coordination: Coordination normal.  Psychiatric:        Mood and Affect: Mood normal.        Behavior: Behavior  normal.      Imaging: No results found.

## 2023-10-07 NOTE — Procedures (Signed)
 Lumbar Epidural Steroid Injection - Interlaminar Approach with Fluoroscopic Guidance  Patient: Jessica Pacheco      Date of Birth: 1975/09/03 MRN: [id number] PCP: Sheryle Carwin, MD      Visit Date: 09/27/2023   Universal Protocol:     Consent Given By: the patient  Position: PRONE  Additional Comments: Vital signs were monitored before and after the procedure. Patient was prepped and draped in the usual sterile fashion. The correct patient, procedure, and site was verified.   Injection Procedure Details:   Procedure diagnoses: Lumbar radiculopathy [M54.16]   Meds Administered:  Meds ordered this encounter  Medications   methylPREDNISolone  acetate (DEPO-MEDROL ) injection 40 mg     Laterality: Right  Location/Site:  L3-4  Needle: 3.5 in., 20 ga. Tuohy  Needle Placement: Paramedian epidural  Findings:   -Comments: Excellent flow of contrast into the epidural space.  Procedure Details: Using a paramedian approach from the side mentioned above, the region overlying the inferior lamina was localized under fluoroscopic visualization and the soft tissues overlying this structure were infiltrated with 4 ml. of 1% Lidocaine  without Epinephrine . The Tuohy needle was inserted into the epidural space using a paramedian approach.   The epidural space was localized using loss of resistance along with counter oblique bi-planar fluoroscopic views.  After negative aspirate for air, blood, and CSF, a 2 ml. volume of Isovue -250 was injected into the epidural space and the flow of contrast was observed. Radiographs were obtained for documentation purposes.    The injectate was administered into the level noted above.   Additional Comments:  The patient tolerated the procedure well Dressing: 2 x 2 sterile gauze and Band-Aid    Post-procedure details: Patient was observed during the procedure. Post-procedure instructions were reviewed.  Patient left the clinic in stable condition.

## 2023-10-16 DIAGNOSIS — S338XXA Sprain of other parts of lumbar spine and pelvis, initial encounter: Secondary | ICD-10-CM | POA: Diagnosis not present

## 2023-10-16 DIAGNOSIS — S233XXA Sprain of ligaments of thoracic spine, initial encounter: Secondary | ICD-10-CM | POA: Diagnosis not present

## 2023-10-16 DIAGNOSIS — M9902 Segmental and somatic dysfunction of thoracic region: Secondary | ICD-10-CM | POA: Diagnosis not present

## 2023-10-16 DIAGNOSIS — M9903 Segmental and somatic dysfunction of lumbar region: Secondary | ICD-10-CM | POA: Diagnosis not present

## 2023-10-30 DIAGNOSIS — S338XXA Sprain of other parts of lumbar spine and pelvis, initial encounter: Secondary | ICD-10-CM | POA: Diagnosis not present

## 2023-10-30 DIAGNOSIS — M9903 Segmental and somatic dysfunction of lumbar region: Secondary | ICD-10-CM | POA: Diagnosis not present

## 2023-10-30 DIAGNOSIS — S233XXA Sprain of ligaments of thoracic spine, initial encounter: Secondary | ICD-10-CM | POA: Diagnosis not present

## 2023-10-30 DIAGNOSIS — M9902 Segmental and somatic dysfunction of thoracic region: Secondary | ICD-10-CM | POA: Diagnosis not present

## 2023-11-08 DIAGNOSIS — S338XXA Sprain of other parts of lumbar spine and pelvis, initial encounter: Secondary | ICD-10-CM | POA: Diagnosis not present

## 2023-11-08 DIAGNOSIS — M9902 Segmental and somatic dysfunction of thoracic region: Secondary | ICD-10-CM | POA: Diagnosis not present

## 2023-11-08 DIAGNOSIS — M9903 Segmental and somatic dysfunction of lumbar region: Secondary | ICD-10-CM | POA: Diagnosis not present

## 2023-11-08 DIAGNOSIS — S233XXA Sprain of ligaments of thoracic spine, initial encounter: Secondary | ICD-10-CM | POA: Diagnosis not present

## 2023-11-27 DIAGNOSIS — S338XXA Sprain of other parts of lumbar spine and pelvis, initial encounter: Secondary | ICD-10-CM | POA: Diagnosis not present

## 2023-11-27 DIAGNOSIS — S233XXA Sprain of ligaments of thoracic spine, initial encounter: Secondary | ICD-10-CM | POA: Diagnosis not present

## 2023-11-27 DIAGNOSIS — M9902 Segmental and somatic dysfunction of thoracic region: Secondary | ICD-10-CM | POA: Diagnosis not present

## 2023-11-27 DIAGNOSIS — M9903 Segmental and somatic dysfunction of lumbar region: Secondary | ICD-10-CM | POA: Diagnosis not present

## 2023-12-11 DIAGNOSIS — S338XXA Sprain of other parts of lumbar spine and pelvis, initial encounter: Secondary | ICD-10-CM | POA: Diagnosis not present

## 2023-12-11 DIAGNOSIS — M9903 Segmental and somatic dysfunction of lumbar region: Secondary | ICD-10-CM | POA: Diagnosis not present

## 2023-12-11 DIAGNOSIS — S233XXA Sprain of ligaments of thoracic spine, initial encounter: Secondary | ICD-10-CM | POA: Diagnosis not present

## 2023-12-11 DIAGNOSIS — M9902 Segmental and somatic dysfunction of thoracic region: Secondary | ICD-10-CM | POA: Diagnosis not present

## 2023-12-24 ENCOUNTER — Encounter: Payer: Self-pay | Admitting: Radiology

## 2023-12-25 DIAGNOSIS — S233XXA Sprain of ligaments of thoracic spine, initial encounter: Secondary | ICD-10-CM | POA: Diagnosis not present

## 2023-12-25 DIAGNOSIS — M9903 Segmental and somatic dysfunction of lumbar region: Secondary | ICD-10-CM | POA: Diagnosis not present

## 2023-12-25 DIAGNOSIS — S338XXA Sprain of other parts of lumbar spine and pelvis, initial encounter: Secondary | ICD-10-CM | POA: Diagnosis not present

## 2023-12-25 DIAGNOSIS — M9902 Segmental and somatic dysfunction of thoracic region: Secondary | ICD-10-CM | POA: Diagnosis not present

## 2024-01-08 DIAGNOSIS — S338XXA Sprain of other parts of lumbar spine and pelvis, initial encounter: Secondary | ICD-10-CM | POA: Diagnosis not present

## 2024-01-08 DIAGNOSIS — M9903 Segmental and somatic dysfunction of lumbar region: Secondary | ICD-10-CM | POA: Diagnosis not present

## 2024-01-08 DIAGNOSIS — M9902 Segmental and somatic dysfunction of thoracic region: Secondary | ICD-10-CM | POA: Diagnosis not present

## 2024-01-08 DIAGNOSIS — S233XXA Sprain of ligaments of thoracic spine, initial encounter: Secondary | ICD-10-CM | POA: Diagnosis not present

## 2024-01-11 DIAGNOSIS — F410 Panic disorder [episodic paroxysmal anxiety] without agoraphobia: Secondary | ICD-10-CM | POA: Diagnosis not present

## 2024-01-11 DIAGNOSIS — I10 Essential (primary) hypertension: Secondary | ICD-10-CM | POA: Diagnosis not present

## 2024-01-11 DIAGNOSIS — M069 Rheumatoid arthritis, unspecified: Secondary | ICD-10-CM | POA: Diagnosis not present

## 2024-01-11 DIAGNOSIS — Z23 Encounter for immunization: Secondary | ICD-10-CM | POA: Diagnosis not present

## 2024-01-11 DIAGNOSIS — N183 Chronic kidney disease, stage 3 unspecified: Secondary | ICD-10-CM | POA: Diagnosis not present

## 2024-01-18 DIAGNOSIS — Z79899 Other long term (current) drug therapy: Secondary | ICD-10-CM | POA: Diagnosis not present

## 2024-01-18 DIAGNOSIS — M0609 Rheumatoid arthritis without rheumatoid factor, multiple sites: Secondary | ICD-10-CM | POA: Diagnosis not present

## 2024-01-18 DIAGNOSIS — E039 Hypothyroidism, unspecified: Secondary | ICD-10-CM | POA: Diagnosis not present

## 2024-01-22 DIAGNOSIS — M9903 Segmental and somatic dysfunction of lumbar region: Secondary | ICD-10-CM | POA: Diagnosis not present

## 2024-01-22 DIAGNOSIS — M9902 Segmental and somatic dysfunction of thoracic region: Secondary | ICD-10-CM | POA: Diagnosis not present

## 2024-01-22 DIAGNOSIS — S233XXA Sprain of ligaments of thoracic spine, initial encounter: Secondary | ICD-10-CM | POA: Diagnosis not present

## 2024-01-22 DIAGNOSIS — S338XXA Sprain of other parts of lumbar spine and pelvis, initial encounter: Secondary | ICD-10-CM | POA: Diagnosis not present

## 2024-01-25 DIAGNOSIS — M25561 Pain in right knee: Secondary | ICD-10-CM | POA: Diagnosis not present

## 2024-01-25 DIAGNOSIS — M170 Bilateral primary osteoarthritis of knee: Secondary | ICD-10-CM | POA: Diagnosis not present

## 2024-01-25 DIAGNOSIS — N1831 Chronic kidney disease, stage 3a: Secondary | ICD-10-CM | POA: Diagnosis not present

## 2024-01-25 DIAGNOSIS — Z79899 Other long term (current) drug therapy: Secondary | ICD-10-CM | POA: Diagnosis not present

## 2024-01-25 DIAGNOSIS — G8929 Other chronic pain: Secondary | ICD-10-CM | POA: Diagnosis not present

## 2024-01-25 DIAGNOSIS — M0609 Rheumatoid arthritis without rheumatoid factor, multiple sites: Secondary | ICD-10-CM | POA: Diagnosis not present

## 2024-02-05 DIAGNOSIS — S233XXA Sprain of ligaments of thoracic spine, initial encounter: Secondary | ICD-10-CM | POA: Diagnosis not present

## 2024-02-05 DIAGNOSIS — M9903 Segmental and somatic dysfunction of lumbar region: Secondary | ICD-10-CM | POA: Diagnosis not present

## 2024-02-05 DIAGNOSIS — S338XXA Sprain of other parts of lumbar spine and pelvis, initial encounter: Secondary | ICD-10-CM | POA: Diagnosis not present

## 2024-02-05 DIAGNOSIS — M9902 Segmental and somatic dysfunction of thoracic region: Secondary | ICD-10-CM | POA: Diagnosis not present

## 2024-08-04 ENCOUNTER — Ambulatory Visit: Admitting: Orthopedic Surgery
